# Patient Record
Sex: Female | Born: 1945 | Race: White | Hispanic: No | Marital: Married | Smoking: Never smoker
Health system: Southern US, Community
[De-identification: ages and names within clinical notes are randomized; demographics above are authoritative.]

## PROBLEM LIST (undated history)

## (undated) DIAGNOSIS — R42 Dizziness and giddiness: Secondary | ICD-10-CM

## (undated) DIAGNOSIS — D649 Anemia, unspecified: Secondary | ICD-10-CM

## (undated) DIAGNOSIS — K2970 Gastritis, unspecified, without bleeding: Secondary | ICD-10-CM

## (undated) DIAGNOSIS — E785 Hyperlipidemia, unspecified: Secondary | ICD-10-CM

## (undated) DIAGNOSIS — K5790 Diverticulosis of intestine, part unspecified, without perforation or abscess without bleeding: Secondary | ICD-10-CM

## (undated) DIAGNOSIS — R682 Dry mouth, unspecified: Secondary | ICD-10-CM

## (undated) DIAGNOSIS — R238 Other skin changes: Secondary | ICD-10-CM

## (undated) DIAGNOSIS — I213 ST elevation (STEMI) myocardial infarction of unspecified site: Secondary | ICD-10-CM

## (undated) DIAGNOSIS — K219 Gastro-esophageal reflux disease without esophagitis: Secondary | ICD-10-CM

## (undated) DIAGNOSIS — C069 Malignant neoplasm of mouth, unspecified: Secondary | ICD-10-CM

## (undated) DIAGNOSIS — S22080A Wedge compression fracture of T11-T12 vertebra, initial encounter for closed fracture: Secondary | ICD-10-CM

## (undated) DIAGNOSIS — I959 Hypotension, unspecified: Secondary | ICD-10-CM

## (undated) DIAGNOSIS — J45909 Unspecified asthma, uncomplicated: Secondary | ICD-10-CM

## (undated) DIAGNOSIS — J439 Emphysema, unspecified: Secondary | ICD-10-CM

## (undated) DIAGNOSIS — T4145XA Adverse effect of unspecified anesthetic, initial encounter: Secondary | ICD-10-CM

## (undated) DIAGNOSIS — M810 Age-related osteoporosis without current pathological fracture: Secondary | ICD-10-CM

## (undated) DIAGNOSIS — Z8719 Personal history of other diseases of the digestive system: Secondary | ICD-10-CM

## (undated) DIAGNOSIS — Z860100 Personal history of colon polyps, unspecified: Secondary | ICD-10-CM

## (undated) DIAGNOSIS — K5900 Constipation, unspecified: Secondary | ICD-10-CM

## (undated) DIAGNOSIS — R233 Spontaneous ecchymoses: Secondary | ICD-10-CM

## (undated) DIAGNOSIS — I255 Ischemic cardiomyopathy: Secondary | ICD-10-CM

## (undated) DIAGNOSIS — I2510 Atherosclerotic heart disease of native coronary artery without angina pectoris: Secondary | ICD-10-CM

## (undated) DIAGNOSIS — T8859XA Other complications of anesthesia, initial encounter: Secondary | ICD-10-CM

## (undated) DIAGNOSIS — S065XAA Traumatic subdural hemorrhage with loss of consciousness status unknown, initial encounter: Secondary | ICD-10-CM

## (undated) DIAGNOSIS — F0390 Unspecified dementia without behavioral disturbance: Secondary | ICD-10-CM

## (undated) DIAGNOSIS — S065X9A Traumatic subdural hemorrhage with loss of consciousness of unspecified duration, initial encounter: Secondary | ICD-10-CM

## (undated) DIAGNOSIS — R112 Nausea with vomiting, unspecified: Secondary | ICD-10-CM

## (undated) DIAGNOSIS — Z9889 Other specified postprocedural states: Secondary | ICD-10-CM

## (undated) HISTORY — DX: Traumatic subdural hemorrhage with loss of consciousness of unspecified duration, initial encounter: S06.5X9A

## (undated) HISTORY — DX: Hypotension, unspecified: I95.9

## (undated) HISTORY — PX: PEG PLACEMENT: SHX5437

## (undated) HISTORY — DX: Age-related osteoporosis without current pathological fracture: M81.0

## (undated) HISTORY — PX: TONSILLECTOMY: SUR1361

## (undated) HISTORY — PX: COLONOSCOPY: SHX174

## (undated) HISTORY — DX: Wedge compression fracture of T11-T12 vertebra, initial encounter for closed fracture: S22.080A

## (undated) HISTORY — DX: Emphysema, unspecified: J43.9

## (undated) HISTORY — DX: Traumatic subdural hemorrhage with loss of consciousness status unknown, initial encounter: S06.5XAA

## (undated) HISTORY — PX: PACEMAKER INSERTION: SHX728

## (undated) HISTORY — DX: Gastritis, unspecified, without bleeding: K29.70

## (undated) HISTORY — DX: Unspecified dementia, unspecified severity, without behavioral disturbance, psychotic disturbance, mood disturbance, and anxiety: F03.90

## (undated) HISTORY — DX: Hyperlipidemia, unspecified: E78.5

## (undated) HISTORY — DX: Malignant neoplasm of mouth, unspecified: C06.9

## (undated) HISTORY — DX: Atherosclerotic heart disease of native coronary artery without angina pectoris: I25.10

## (undated) HISTORY — PX: VAGINAL HYSTERECTOMY: SUR661

## (undated) HISTORY — DX: Ischemic cardiomyopathy: I25.5

---

## 1968-09-19 HISTORY — PX: BUNIONECTOMY: SHX129

## 1973-01-19 HISTORY — PX: TUBAL LIGATION: SHX77

## 1988-09-19 HISTORY — PX: SHOULDER OPEN ROTATOR CUFF REPAIR: SHX2407

## 1999-03-31 ENCOUNTER — Encounter: Payer: Self-pay | Admitting: Obstetrics and Gynecology

## 1999-03-31 ENCOUNTER — Encounter: Admission: RE | Admit: 1999-03-31 | Discharge: 1999-03-31 | Payer: Self-pay | Admitting: Obstetrics and Gynecology

## 1999-04-23 ENCOUNTER — Encounter: Payer: Self-pay | Admitting: *Deleted

## 1999-04-23 ENCOUNTER — Encounter (INDEPENDENT_AMBULATORY_CARE_PROVIDER_SITE_OTHER): Payer: Self-pay | Admitting: *Deleted

## 1999-04-23 ENCOUNTER — Ambulatory Visit (HOSPITAL_BASED_OUTPATIENT_CLINIC_OR_DEPARTMENT_OTHER): Admission: RE | Admit: 1999-04-23 | Discharge: 1999-04-23 | Payer: Self-pay | Admitting: *Deleted

## 2000-09-23 ENCOUNTER — Emergency Department (HOSPITAL_COMMUNITY): Admission: EM | Admit: 2000-09-23 | Discharge: 2000-09-23 | Payer: Self-pay | Admitting: Emergency Medicine

## 2001-01-19 DIAGNOSIS — C069 Malignant neoplasm of mouth, unspecified: Secondary | ICD-10-CM

## 2001-01-19 HISTORY — DX: Malignant neoplasm of mouth, unspecified: C06.9

## 2002-04-16 ENCOUNTER — Emergency Department (HOSPITAL_COMMUNITY): Admission: EM | Admit: 2002-04-16 | Discharge: 2002-04-16 | Payer: Self-pay | Admitting: Physical Therapy

## 2006-06-12 ENCOUNTER — Emergency Department (HOSPITAL_COMMUNITY): Admission: EM | Admit: 2006-06-12 | Discharge: 2006-06-12 | Payer: Self-pay | Admitting: Emergency Medicine

## 2008-11-15 ENCOUNTER — Emergency Department (HOSPITAL_COMMUNITY): Admission: EM | Admit: 2008-11-15 | Discharge: 2008-11-15 | Payer: Self-pay | Admitting: Family Medicine

## 2010-06-06 NOTE — Op Note (Signed)
[place]  Patient:    Pamela Kidd, Pamela Kidd                        MRN: [id number] Proc. Date: 04/23/99 Adm. Date:  [id number] Attending:  Kandis Mannan CC:         Katherine Roan, M.D.             Thomas B. Samuella Cota, M.D.                           Operative Report  CCS# [id number]  PREOPERATIVE DIAGNOSIS:  Abnormal mammogram, left breast.  POSTOPERATIVE DIAGNOSIS:  Abnormal mammogram, left breast.  OPERATION PERFORMED:  Needle localization and biopsy of left breast.  SURGEON:  Maisie Fus B. Samuella Cota, M.D.  ANESTHESIA:  1% Xylocaine local with anesthesia monitoring, anesthesiologist and CRNA.  DESCRIPTION OF PROCEDURE:  The patient was taken to the operating room and placed on the table in supine position.  The left breast was prepped and draped as sterile field.  The patient had a wire coming through the skin at about 3 oclock position.  A curved incision was outlined with a skin marker. 1% Xylocaine local was used to infiltrate the skin and underlying breast tissue.  The elliptical incision was made so as to remove the opening in the skin made by the wire.  Using the wire as a guide and the cautery, a core of breast tissue was taken out.  During the dissection there were numerous areas of cystic disease with some gray fairly thick fluid and some dilated ducts. Hemostasis was attained with the cautery and with suture ligatures of 3-0 Vicryl.  The specimen was given to the radiologist, who x-rayed the specimen and felt sure that the area of concern had been removed although it did not show up well.  Apparently when she had placed the needle earlier, she thought that she may have entered a cyst which made the area less prominent.  The needle was certainly localized well in the specimen.  It was removed.  The wound was irrigated.  Subcutaneous tissue closed with 3-0 Vicryl and the skin was closed with running subcuticular suture of 5-0 Vicryl.  Benzoin and  half inch Steri-Strips were used to reinforce the skin closure.  Pressure dressing using 4 x 4s, ABD and 4 inch Hypafix was applied.  The patient seemed to tolerate the procedure well and was taken to the PACU in satisfactory condition. DD:  04/23/99 TD:  04/24/99 Job: [id number]

## 2010-07-22 ENCOUNTER — Emergency Department (HOSPITAL_COMMUNITY)
Admission: EM | Admit: 2010-07-22 | Discharge: 2010-07-22 | Disposition: A | Discharge status: Home / Self Care | Payer: Medicare Other | Attending: Emergency Medicine | Admitting: Emergency Medicine

## 2010-07-22 DIAGNOSIS — E039 Hypothyroidism, unspecified: Secondary | ICD-10-CM | POA: Insufficient documentation

## 2010-07-22 DIAGNOSIS — R197 Diarrhea, unspecified: Secondary | ICD-10-CM | POA: Insufficient documentation

## 2010-07-22 DIAGNOSIS — R112 Nausea with vomiting, unspecified: Secondary | ICD-10-CM | POA: Insufficient documentation

## 2010-07-22 DIAGNOSIS — R824 Acetonuria: Secondary | ICD-10-CM | POA: Insufficient documentation

## 2010-07-22 DIAGNOSIS — R55 Syncope and collapse: Secondary | ICD-10-CM | POA: Insufficient documentation

## 2010-07-22 DIAGNOSIS — R109 Unspecified abdominal pain: Secondary | ICD-10-CM | POA: Insufficient documentation

## 2010-07-22 LAB — CBC
HCT: 42.8 % (ref 36.0–46.0)
Hemoglobin: 15 g/dL (ref 12.0–15.0)
MCH: 30.7 pg (ref 26.0–34.0)
MCHC: 35 g/dL (ref 30.0–36.0)
MCV: 87.5 fL (ref 78.0–100.0)
Platelets: 184 10*3/uL (ref 150–400)
RBC: 4.89 MIL/uL (ref 3.87–5.11)
RDW: 13.1 % (ref 11.5–15.5)
WBC: 12.5 10*3/uL — ABNORMAL HIGH (ref 4.0–10.5)

## 2010-07-22 LAB — COMPREHENSIVE METABOLIC PANEL
Albumin: 3.3 g/dL — ABNORMAL LOW (ref 3.5–5.2)
BUN: 12 mg/dL (ref 6–23)
Chloride: 110 mEq/L (ref 96–112)
Creatinine, Ser: 0.98 mg/dL (ref 0.50–1.10)
Total Bilirubin: 0.3 mg/dL (ref 0.3–1.2)
Total Protein: 6.2 g/dL (ref 6.0–8.3)

## 2010-07-22 LAB — DIFFERENTIAL
Basophils Absolute: 0 10*3/uL (ref 0.0–0.1)
Basophils Relative: 0 % (ref 0–1)
Eosinophils Absolute: 0 10*3/uL (ref 0.0–0.7)
Eosinophils Relative: 0 % (ref 0–5)
Lymphocytes Relative: 2 % — ABNORMAL LOW (ref 12–46)
Lymphs Abs: 0.3 10*3/uL — ABNORMAL LOW (ref 0.7–4.0)
Monocytes Absolute: 0.3 10*3/uL (ref 0.1–1.0)
Monocytes Relative: 2 % — ABNORMAL LOW (ref 3–12)
Neutro Abs: 11.9 10*3/uL — ABNORMAL HIGH (ref 1.7–7.7)
Neutrophils Relative %: 96 % — ABNORMAL HIGH (ref 43–77)

## 2010-07-22 LAB — LIPASE, BLOOD: Lipase: 24 U/L (ref 11–59)

## 2010-07-22 LAB — URINALYSIS, ROUTINE W REFLEX MICROSCOPIC
Ketones, ur: 15 mg/dL — AB
Leukocytes, UA: NEGATIVE
Nitrite: NEGATIVE
pH: 6 (ref 5.0–8.0)

## 2010-07-22 LAB — CK TOTAL AND CKMB (NOT AT ARMC)
CK, MB: 1.5 ng/mL (ref 0.3–4.0)
Relative Index: INVALID (ref 0.0–2.5)
Total CK: 55 U/L (ref 7–177)

## 2011-04-15 DIAGNOSIS — C01 Malignant neoplasm of base of tongue: Secondary | ICD-10-CM | POA: Insufficient documentation

## 2011-04-20 ENCOUNTER — Other Ambulatory Visit: Payer: Self-pay | Admitting: Family Medicine

## 2011-04-20 DIAGNOSIS — R1011 Right upper quadrant pain: Secondary | ICD-10-CM

## 2011-04-21 ENCOUNTER — Other Ambulatory Visit: Payer: Medicare Other

## 2011-04-24 ENCOUNTER — Ambulatory Visit
Admission: RE | Admit: 2011-04-24 | Discharge: 2011-04-24 | Disposition: A | Discharge status: Home / Self Care | Payer: Medicare Other | Source: Ambulatory Visit | Attending: Family Medicine | Admitting: Family Medicine

## 2011-04-24 DIAGNOSIS — R1011 Right upper quadrant pain: Secondary | ICD-10-CM

## 2011-08-20 DIAGNOSIS — I213 ST elevation (STEMI) myocardial infarction of unspecified site: Secondary | ICD-10-CM

## 2011-08-20 HISTORY — DX: ST elevation (STEMI) myocardial infarction of unspecified site: I21.3

## 2011-08-20 HISTORY — PX: CORONARY ANGIOPLASTY WITH STENT PLACEMENT: SHX49

## 2011-08-26 ENCOUNTER — Inpatient Hospital Stay (HOSPITAL_COMMUNITY): Payer: Medicare Other

## 2011-08-26 ENCOUNTER — Emergency Department (HOSPITAL_COMMUNITY)
Admission: EM | Admit: 2011-08-26 | Discharge: 2011-08-26 | Disposition: A | Discharge status: Home / Self Care | Payer: Medicare Other | Source: Ambulatory Visit | Attending: Cardiology | Admitting: Cardiology

## 2011-08-26 ENCOUNTER — Other Ambulatory Visit: Payer: Self-pay

## 2011-08-26 ENCOUNTER — Inpatient Hospital Stay (HOSPITAL_COMMUNITY)
Admission: EM | Admit: 2011-08-26 | Discharge: 2011-09-01 | DRG: 246 | Disposition: A | Discharge status: Home / Self Care | Payer: Medicare Other | Source: Ambulatory Visit | Attending: Cardiology | Admitting: Cardiology

## 2011-08-26 ENCOUNTER — Encounter (HOSPITAL_COMMUNITY)
Admission: EM | Disposition: A | Discharge status: Home / Self Care | Payer: Self-pay | Source: Ambulatory Visit | Attending: Cardiology

## 2011-08-26 ENCOUNTER — Encounter (HOSPITAL_COMMUNITY): Payer: Self-pay | Admitting: Internal Medicine

## 2011-08-26 DIAGNOSIS — I2510 Atherosclerotic heart disease of native coronary artery without angina pectoris: Secondary | ICD-10-CM | POA: Diagnosis present

## 2011-08-26 DIAGNOSIS — I252 Old myocardial infarction: Secondary | ICD-10-CM | POA: Diagnosis present

## 2011-08-26 DIAGNOSIS — I2109 ST elevation (STEMI) myocardial infarction involving other coronary artery of anterior wall: Secondary | ICD-10-CM

## 2011-08-26 DIAGNOSIS — I255 Ischemic cardiomyopathy: Secondary | ICD-10-CM | POA: Diagnosis present

## 2011-08-26 DIAGNOSIS — I959 Hypotension, unspecified: Secondary | ICD-10-CM | POA: Diagnosis present

## 2011-08-26 DIAGNOSIS — I472 Ventricular tachycardia, unspecified: Secondary | ICD-10-CM | POA: Diagnosis not present

## 2011-08-26 DIAGNOSIS — Z955 Presence of coronary angioplasty implant and graft: Secondary | ICD-10-CM

## 2011-08-26 DIAGNOSIS — E785 Hyperlipidemia, unspecified: Secondary | ICD-10-CM | POA: Diagnosis present

## 2011-08-26 DIAGNOSIS — K5730 Diverticulosis of large intestine without perforation or abscess without bleeding: Secondary | ICD-10-CM | POA: Diagnosis present

## 2011-08-26 DIAGNOSIS — I2589 Other forms of chronic ischemic heart disease: Secondary | ICD-10-CM | POA: Diagnosis present

## 2011-08-26 DIAGNOSIS — R7309 Other abnormal glucose: Secondary | ICD-10-CM | POA: Diagnosis present

## 2011-08-26 DIAGNOSIS — I4901 Ventricular fibrillation: Secondary | ICD-10-CM

## 2011-08-26 DIAGNOSIS — E7800 Pure hypercholesterolemia, unspecified: Secondary | ICD-10-CM | POA: Diagnosis present

## 2011-08-26 DIAGNOSIS — K1370 Unspecified lesions of oral mucosa: Secondary | ICD-10-CM | POA: Diagnosis not present

## 2011-08-26 DIAGNOSIS — I4729 Other ventricular tachycardia: Secondary | ICD-10-CM | POA: Diagnosis not present

## 2011-08-26 HISTORY — DX: Gastro-esophageal reflux disease without esophagitis: K21.9

## 2011-08-26 HISTORY — PX: PERCUTANEOUS CORONARY STENT INTERVENTION (PCI-S): SHX5485

## 2011-08-26 HISTORY — DX: Diverticulosis of intestine, part unspecified, without perforation or abscess without bleeding: K57.90

## 2011-08-26 HISTORY — PX: LEFT HEART CATHETERIZATION WITH CORONARY ANGIOGRAM: SHX5451

## 2011-08-26 LAB — CARDIAC PANEL(CRET KIN+CKTOT+MB+TROPI)
CK, MB: 69.8 ng/mL (ref 0.3–4.0)
Relative Index: 8.5 — ABNORMAL HIGH (ref 0.0–2.5)
Relative Index: 8.4 — ABNORMAL HIGH (ref 0.0–2.5)
Total CK: 395 U/L — ABNORMAL HIGH (ref 7–177)
Total CK: 834 U/L — ABNORMAL HIGH (ref 7–177)
Troponin I: >20 ng/mL (ref <0.30)

## 2011-08-26 LAB — POCT I-STAT, CHEM 8
BUN: 9 mg/dL (ref 6–23)
Calcium, Ion: 1.24 mmol/L (ref 1.13–1.30)
Creatinine, Ser: 0.9 mg/dL (ref 0.50–1.10)
TCO2: 21 mmol/L (ref 0–100)

## 2011-08-26 LAB — COMPREHENSIVE METABOLIC PANEL
Albumin: 3.5 g/dL (ref 3.5–5.2)
Alkaline Phosphatase: 84 U/L (ref 39–117)
BUN: 10 mg/dL (ref 6–23)
Potassium: 3.6 mEq/L (ref 3.5–5.1)
Sodium: 138 mEq/L (ref 135–145)
Total Protein: 6.4 g/dL (ref 6.0–8.3)

## 2011-08-26 LAB — CBC WITH DIFFERENTIAL/PLATELET
Basophils Relative: 0 % (ref 0–1)
Eosinophils Absolute: 0 10*3/uL (ref 0.0–0.7)
MCH: 29.8 pg (ref 26.0–34.0)
MCHC: 34 g/dL (ref 30.0–36.0)
Monocytes Relative: 3 % (ref 3–12)
Neutrophils Relative %: 92 % — ABNORMAL HIGH (ref 43–77)
Platelets: 259 10*3/uL (ref 150–400)
RDW: 13.1 % (ref 11.5–15.5)

## 2011-08-26 LAB — MRSA PCR SCREENING: MRSA by PCR: NEGATIVE

## 2011-08-26 LAB — MAGNESIUM: Magnesium: 1.9 mg/dL (ref 1.5–2.5)

## 2011-08-26 LAB — LIPID PANEL
Cholesterol: 237 mg/dL — ABNORMAL HIGH (ref 0–200)
HDL: 57 mg/dL (ref >39)
Total CHOL/HDL Ratio: 4.2 RATIO
Triglycerides: 86 mg/dL (ref <150)

## 2011-08-26 LAB — PROTIME-INR: Prothrombin Time: 22.9 seconds — ABNORMAL HIGH (ref 11.6–15.2)

## 2011-08-26 LAB — HEMOGLOBIN A1C: Mean Plasma Glucose: 120 mg/dL — ABNORMAL HIGH (ref <117)

## 2011-08-26 LAB — POCT ACTIVATED CLOTTING TIME: Activated Clotting Time: 364 seconds

## 2011-08-26 SURGERY — LEFT HEART CATHETERIZATION WITH CORONARY ANGIOGRAM
Anesthesia: LOCAL

## 2011-08-26 MED ORDER — NITROGLYCERIN 0.2 MG/ML ON CALL CATH LAB
INTRAVENOUS | Status: AC
  Filled 2011-08-26: qty 1

## 2011-08-26 MED ORDER — TICAGRELOR 90 MG PO TABS
90.0000 mg | ORAL_TABLET | Freq: Two times a day (BID) | ORAL | Status: DC
  Administered 2011-08-26 – 2011-09-01 (×12): 90 mg via ORAL
  Filled 2011-08-26 (×15): qty 1

## 2011-08-26 MED ORDER — ONDANSETRON HCL 4 MG/2ML IJ SOLN
4.0000 mg | Freq: Four times a day (QID) | INTRAMUSCULAR | Status: DC | PRN
  Administered 2011-08-27: 4 mg via INTRAVENOUS
  Filled 2011-08-26: qty 2

## 2011-08-26 MED ORDER — LIDOCAINE HCL (PF) 1 % IJ SOLN
INTRAMUSCULAR | Status: AC
  Filled 2011-08-26: qty 30

## 2011-08-26 MED ORDER — AMIODARONE HCL 150 MG/3ML IV SOLN
INTRAVENOUS | Status: AC
  Filled 2011-08-26: qty 3

## 2011-08-26 MED ORDER — ATORVASTATIN CALCIUM 20 MG PO TABS
80.0000 mg | ORAL_TABLET | Freq: Every day | ORAL | Status: DC
  Administered 2011-08-26 – 2011-08-31 (×6): 80 mg via ORAL
  Filled 2011-08-26 (×4): qty 1
  Filled 2011-08-26: qty 4
  Filled 2011-08-26 (×2): qty 1

## 2011-08-26 MED ORDER — NITROGLYCERIN 0.4 MG SL SUBL: 0.4000 mg | SUBLINGUAL_TABLET | SUBLINGUAL | Status: DC | PRN

## 2011-08-26 MED ORDER — ACETAMINOPHEN 325 MG PO TABS: 650.0000 mg | ORAL_TABLET | ORAL | Status: DC | PRN

## 2011-08-26 MED ORDER — PANTOPRAZOLE SODIUM 40 MG PO TBEC
40.0000 mg | DELAYED_RELEASE_TABLET | Freq: Every day | ORAL | Status: DC
  Administered 2011-08-26 – 2011-09-01 (×7): 40 mg via ORAL
  Filled 2011-08-26 (×7): qty 1

## 2011-08-26 MED ORDER — SODIUM CHLORIDE 0.9 % IV BOLUS (SEPSIS)
500.0000 mL | Freq: Once | INTRAVENOUS | Status: AC
  Administered 2011-08-26: 500 mL via INTRAVENOUS

## 2011-08-26 MED ORDER — HEPARIN SODIUM (PORCINE) 5000 UNIT/ML IJ SOLN
5000.0000 [IU] | Freq: Three times a day (TID) | INTRAMUSCULAR | Status: DC
  Administered 2011-08-26 – 2011-08-28 (×5): 5000 [IU] via SUBCUTANEOUS
  Filled 2011-08-26 (×9): qty 1

## 2011-08-26 MED ORDER — ONDANSETRON HCL 4 MG/2ML IJ SOLN
INTRAMUSCULAR | Status: AC
  Filled 2011-08-26: qty 2

## 2011-08-26 MED ORDER — CARVEDILOL 3.125 MG PO TABS
3.1250 mg | ORAL_TABLET | Freq: Two times a day (BID) | ORAL | Status: DC
  Administered 2011-08-26 (×2): 3.125 mg via ORAL
  Filled 2011-08-26 (×5): qty 1

## 2011-08-26 MED ORDER — SODIUM CHLORIDE 0.9 % IV SOLN
INTRAVENOUS | Status: AC
  Administered 2011-08-26: 06:00:00 via INTRAVENOUS

## 2011-08-26 MED ORDER — LISINOPRIL 2.5 MG PO TABS
2.5000 mg | ORAL_TABLET | Freq: Every day | ORAL | Status: DC
  Administered 2011-08-26: 2.5 mg via ORAL
  Filled 2011-08-26 (×2): qty 1

## 2011-08-26 MED ORDER — TICAGRELOR 90 MG PO TABS
ORAL_TABLET | ORAL | Status: AC
  Filled 2011-08-26: qty 2

## 2011-08-26 MED ORDER — BIVALIRUDIN 250 MG IV SOLR
INTRAVENOUS | Status: AC
  Filled 2011-08-26: qty 250

## 2011-08-26 MED ORDER — ACETAMINOPHEN 325 MG PO TABS
650.0000 mg | ORAL_TABLET | ORAL | Status: DC | PRN
  Administered 2011-08-26 (×2): 650 mg via ORAL
  Filled 2011-08-26 (×2): qty 2

## 2011-08-26 MED ORDER — SODIUM CHLORIDE 0.9 % IV SOLN: 0.2500 mg/kg/h | INTRAVENOUS | Status: AC

## 2011-08-26 MED ORDER — HEPARIN (PORCINE) IN NACL 2-0.9 UNIT/ML-% IJ SOLN
INTRAMUSCULAR | Status: AC
  Filled 2011-08-26: qty 2000

## 2011-08-26 MED ORDER — HEPARIN SODIUM (PORCINE) 5000 UNIT/ML IJ SOLN: 5000.0000 [IU] | Freq: Three times a day (TID) | INTRAMUSCULAR | Status: DC

## 2011-08-26 MED ORDER — ONDANSETRON HCL 4 MG/2ML IJ SOLN: 4.0000 mg | Freq: Four times a day (QID) | INTRAMUSCULAR | Status: DC | PRN

## 2011-08-26 MED ORDER — ASPIRIN EC 81 MG PO TBEC
81.0000 mg | DELAYED_RELEASE_TABLET | Freq: Every day | ORAL | Status: DC
  Administered 2011-08-26 – 2011-09-01 (×7): 81 mg via ORAL
  Filled 2011-08-26 (×7): qty 1

## 2011-08-26 NOTE — CV Procedure (Signed)
  Cardiac Catheterization Procedure Note  Name: Pamela Kidd MRN: [id number] DOB: 1945/06/19  Procedure: Left Heart Cath, Selective Coronary Angiography, LV angiography,  PTCA/Stent of the proximal LAD  Indication: 66 year old white female with no prior history of coronary disease and no cardiac risk factors who presents with an anterior ST elevation myocardial infarction. She has 2 mm of ST segment elevation in leads V2 through V3. He has ongoing chest pain.   Diagnostic Procedure Details: The right groin was prepped, draped, and anesthetized with 1% lidocaine. Using the modified Seldinger technique, a 6 French sheath was introduced into the right femoral artery. Standard Judkins catheters were used for selective coronary angiography and left ventriculography. Catheter exchanges were performed over a wire.  The diagnostic procedure was well-tolerated without immediate complications.  PROCEDURAL FINDINGS Hemodynamics: AO 118/74 with a mean of 94 mmHg LV 112/18 mmHg  Coronary angiography: Coronary dominance: right  Left mainstem: Short and without significant disease.  Left anterior descending (LAD): 100% occlusion in the proximal vessel.  Left circumflex (LCx): This is a large vessel with 3 marginal branches. There are mild irregularities less than 20%.  Right coronary artery (RCA): This is a dominant but relatively small vessel. There is a 20-30% narrowing in the mid vessel.  Left ventriculography: Left ventricular systolic function post intervention is abnormal, LVEF is estimated at 30-35 %, there is extensive anterior and apical akinesis. there is no significant mitral regurgitation   PCI Procedure Note:  Following the diagnostic procedure, the decision was made to proceed with PCI. Brilinta 180 mg po was given. Weight-based bivalirudin was given for anticoagulation. Once a therapeutic ACT was achieved, a 6 [place] XB LAD 3.5 guide catheter was inserted.  A pro-water coronary guidewire  was used to cross the lesion.  The lesion was predilated with a 2.5 mm balloon. With initial reperfusion the patient developed sustained polymorphic ventricular tachycardia/fibrillation and required a single DC shock.  The lesion was then stented with a 2.75 x 23 mm Xience Xpedition stent.  The stent was postdilated with a 2.75 mm noncompliant balloon to 16 atmospheres.  Following PCI, there was 0% residual stenosis and TIMI-3 flow. Final angiography confirmed an excellent result. Femoral hemostasis was achieved with manual compression.  She had no further arrhythmias and was pain-free at the end of procedure. The patient tolerated the PCI procedure well. There were no immediate procedural complications.  The patient was transferred to the post catheterization recovery area for further monitoring.  PCI Data: Vessel - LAD/Segment - proximal Percent Stenosis (pre)  100% TIMI-flow 0 Stent 2.75 x 23 mm Xience Xpedition Percent Stenosis (post) 0% TIMI-flow (post) 3  Final Conclusions:   1. Single vessel occlusive coronary disease. 2. Severe left ventricular dysfunction. 3. Successful stenting of the proximal LAD with a drug-eluting stent.  Recommendations: Continue dual antiplatelet therapy for one year. Risk factor modification.  [facility] 08/26/2011, 5:02 AM

## 2011-08-26 NOTE — Progress Notes (Signed)
   TELEMETRY: Reviewed telemetry pt in NSR. : Filed Vitals:   08/26/11 0615 08/26/11 0630 08/26/11 0645 08/26/11 0700  BP:    111/79  Pulse: 85 85 85 90  Temp:      TempSrc:      Resp: 15 15 13 16   Height:      Weight:      SpO2: 98% 99% 98% 98%    Intake/Output Summary (Last 24 hours) at 08/26/11 0741 Last data filed at 08/26/11 0700  Gross per 24 hour  Intake    150 ml  Output      0 ml  Net    150 ml    SUBJECTIVE Denies any chest pain or SOB. Feels much better.  LABS: Basic Metabolic Panel:  Basename 08/26/11 0605  NA 138  K 3.6  CL 104  CO2 22  GLUCOSE 137*  BUN 10  CREATININE 0.88  CALCIUM 9.7  MG 1.9  PHOS --   Liver Function Tests:  [facility] 08/26/11 0605  AST 32  ALT 11  ALKPHOS 84  BILITOT 0.4  PROT 6.4  ALBUMIN 3.5   CBC:  Basename 08/26/11 0605  WBC 15.3*  NEUTROABS 14.0*  HGB 13.9  HCT 40.9  MCV 87.6  PLT 259   Cardiac Enzymes:  Basename 08/26/11 0605  CKTOTAL 395*  CKMB 33.7*  CKMBINDEX --  TROPONINI 13.07*   Hemoglobin A1C: No results found for this basename: HGBA1C in the last 72 hours Fasting Lipid Panel:  Basename 08/26/11 0605  CHOL 237*  HDL 57  LDLCALC 163*  TRIG 86  CHOLHDL 4.2  LDLDIRECT --   Radiology/Studies:  No results found.  Ecg: NSR with Q waves in V1-2. ST elevation resolved.  PHYSICAL EXAM General: Well developed, well nourished, in no acute distress. Head: Normocephalic, atraumatic, sclera non-icteric, no xanthomas, nares are without discharge. Neck: Negative for carotid bruits. JVD not elevated. Lungs: Clear bilaterally to auscultation without wheezes, rales, or rhonchi. Breathing is unlabored. Heart: RRR S1 S2 without murmurs, rubs, or gallops.  Abdomen: Soft, non-tender, non-distended with normoactive bowel sounds. No hepatomegaly. No rebound/guarding. No obvious abdominal masses. Msk:  Strength and tone appears normal for age. Extremities: No clubbing, cyanosis or edema.  Distal pedal  pulses are 2+ and equal bilaterally. Right groin without hematoma/ Neuro: Alert and oriented X 3. Moves all extremities spontaneously. Psych:  Responds to questions appropriately with a normal affect.  ASSESSMENT AND PLAN: 1. STEMI anterior. S/p DES proximal LAD. Continue ASA, Brilinta. Start low dose carvedilol and lisinopril. Observe for complications. No further VT since reperfusion. Echo today. Check CXR.  2. Hypercholesterolemia. On Lipitor.   3. Hypokalemia improved.   4. Elevated blood glucose. Will check A1c  5. Ischemic cardiomyopathy EF 30-35% by cath. Await Echo. Start ACEi and carvedilol. Titrate dose as tolerated.  Principal Problem:  *ST elevation myocardial infarction (STEMI) of anterior wall Active Problems:  Ventricular tachycardia  Ischemic cardiomyopathy  Hypercholesterolemia    Signed, Idalis Hoelting [place] MD,FACC 08/26/2011 7:41 AM

## 2011-08-26 NOTE — Care Management Note (Addendum)
    Page 1 of 1   08/31/2011     2:48:19 PM   CARE MANAGEMENT NOTE 08/31/2011  Patient:  GARYN, WAGUESPACK   Account Number:  [id number]  Date Initiated:  08/26/2011  Documentation initiated by:  Junius Creamer  Subjective/Objective Assessment:   adm w mi     Action/Plan:   lives w husband, pcp dr Broadus John pickard   Anticipated DC Date:  08/31/2011   Anticipated DC Plan:  HOME/SELF CARE      DC Planning Services  CM consult      Choice offered to / List presented to:             Status of service:  Completed, signed off Medicare Important Message given?   (If response is "NO", the following Medicare IM given date fields will be blank) Date Medicare IM given:   Date Additional Medicare IM given:    Discharge Disposition:  HOME/SELF CARE  Per UR Regulation:  Reviewed for med. necessity/level of care/duration of stay  If discussed at Long Length of Stay Meetings, dates discussed:    Comments:  08/31/11- 1230- Donn Pierini RN, BSN [phone] Pt to d/c with Brilinta- spoke with pt at bedside to confirm that she had the Brilinta savings card which she does- also confirmed drugstore of choice to be [facility] on [address]- called [facility]- and they do not have Brilinta in stock-would have to order- spoke with pt again to see if she had another pharmacy she wanted to use in case she is discharged today- pt states [facility] on [address]- call made to [facility]- which does have the Brilinta in stock- pt informed of this- and states she appreciates assistance- plans to go to [facility].  No other d/c needs noted at this time.   8/7 10:23a debbie dowell rn,bsn [phone] pt has 45.00 copay for 30days of brilinta and 125.00 for 90day mail in.

## 2011-08-26 NOTE — Progress Notes (Signed)
Right femoral sheath removed and pressure held for 20 minutes. No evidence of bruising  or hematoma. Patient given instructions and verbalizes understanding. Right pedal pulse palpable. Takia RN called in to confirm condition of right groin and right pedal pulse.

## 2011-08-26 NOTE — Progress Notes (Signed)
CRITICAL VALUE ALERT  Critical value received:  CKMB 33.7 and Trop 13.07  Date of notification:  08/26/2011  Time of notification:  0731  Critical value read back:yes  Nurse who received alert:  Malachy Chamber, RN   MD notified (1st page):  Dr. [place]   Time of first page:  [phone]  MD notified (2nd page):  Time of second page:  Responding MD:  Dr. [place]  Time MD responded:  [phone]

## 2011-08-26 NOTE — H&P (Signed)
Physician History and Physical    Pamela Kidd MRN: [id number] DOB/AGE: 07-30-1945 66 y.o. Admit date: 08/26/2011  Primary Cardiologist:  New pt  CC:  CP, Anterior STEMI  HPI:  Pt is a 66 yo woman with hx of diverticulosis who presents with chest pain starting at 10 pm tonight.  EKG per EMS shows anterior STEMI.  Pt reports that she is very functional, no prior episodes of chest pain.  Reports that she actually drove back from the beach today before this happened.  She denies SOB, PND, orthopnea, LE edema.  SHe currently has substernal CP.  Per EMS, they were not able to given nitro because pt was hypotensive on presentation, SBP 80s.  SHe did get 324 mg of aspirin. She has no other acute complaints.  Review of systems: A review of 10 organ systems was done and is negative except as stated above in HPI  Past Medical History  Diagnosis Date  . Diverticulosis    PSH: pt reports that she has had some abd surgeries, but is unable to recall what those surgeries were  History   Social History  . Marital Status: Married    Spouse Name: N/A    Number of Children: N/A  . Years of Education: N/A   Occupational History  . Not on file.   Social History Main Topics  . Smoking status: Never Smoker   . Smokeless tobacco: Not on file  . Alcohol Use: Not on file  . Drug Use: Not on file  . Sexually Active: Not on file   Other Topics Concern  . Not on file   Social History Narrative  . No narrative on file    FH: father died age 90 of MI   Allergies  Allergen Reactions  . Codeine     No prescriptions prior to admission  pt is not currently taking any m eds    Physical Exam: There were no vitals taken for this visit.; There is no height or weight on file to calculate BMI.    No intake or output data in the 24 hours ending 08/26/11 0433 General: NAD Heent: MMM Neck: No JVD  CV: RRR, no m Lungs: Clear to auscultation bilaterally with normal respiratory  effort Abdomen: Soft, nontender, nondistended Extremities: No clubbing or cyanosis. No pedal edema Skin: Intact without lesions or rashes  Neurologic: Alert and oriented x 3, grossly nonfocal  Psych: Normal mood and affect    Labs: No results found for this basename: CKTOTAL:4,CKMB:4,TROPONINI:4 in the last 72 hours Lab Results  Component Value Date   WBC 12.5* 07/22/2010   HGB 15.0 07/22/2010   HCT 42.8 07/22/2010   MCV 87.5 07/22/2010   PLT 184 07/22/2010   No results found for this basename: NA,K,CL,CO2,BUN,CREATININE,CALCIUM,LABALBU,PROT,BILITOT,ALKPHOS,ALT,AST,GLUCOSE in the last 168 hours No results found for this basename: CHOL, HDL, LDLCALC, TRIG      EKG:  Sinus STE V1-3 with ST dep inf (reciprocal changes)  Cath:   100% occlusion prox LAD s/p DES  In cath lab, pt with VT s/p 200j shock and amio bolus   Radiology:  No results found.  ASSESSMENT:  Pt is a 66 yo woman with hx of diverticulosis who presents with chest pain starting at 10 pm tonight.  EKG per EMS shows anterior STEMI  PLAN:  Anterior STEMI s/p prox LAD DES  - aspirin, brilinta, statin  - check lipids  - trend enzymes - echo  - add BB  when BPs tolerate  VT s/p 200j shock and amio bolus in cath lab - can start amio gtt if recurs   Signed: Hilary Hertz, MD Cardiology Fellow 08/26/2011, 4:33 AM

## 2011-08-26 NOTE — Progress Notes (Signed)
Nutrition Brief Note  Patient identified on the Nutrition Risk Report for problems chewing and swallowing. Pt reports chewing problems r/t hx of oral cancer. Pt chooses soft foods that she knows she can tolerate. Pt to order meals.   Body mass index is 21.38 kg/(m^2). Pt meets criteria for WNL based on current BMI.   Current diet order is heart, no meals completed at this time. Labs and medications reviewed.   No nutrition interventions warranted at this time. If nutrition issues arise, please re-consult RD.   Clarene Duke RD, LDN Pager [phone] After Hours pager [phone]

## 2011-08-26 NOTE — Progress Notes (Signed)
Responded to code stemi. I inquired about family of pt. Pt's husband was enroute.  Alerted ED triage nurse that family would be arriving shortly and requested to be paged upon arrival. Dellie Catholic  [phone] oncall pager

## 2011-08-26 NOTE — Progress Notes (Signed)
This visit was in response to a code stemi.  ED paged me when pt's husband, Marcy Salvo, arrived.  I escorted husband to consultation room outside of cath lab and alerted medical staff of his presence.  I provided emotional support while pt's husband waited for cath procedure to be over and remained present during doctor's explanation of what had taken place.  After the consultation, I escorted pt's husband to 2900 waiting room and alerted staff that he was present.  Pt's husband was grateful for support.   Please page me if further assistance is needed or follow-up with the unit chaplain is necessary. Delray Reza  [phone] oncall pager

## 2011-08-27 DIAGNOSIS — I2589 Other forms of chronic ischemic heart disease: Secondary | ICD-10-CM

## 2011-08-27 DIAGNOSIS — E7800 Pure hypercholesterolemia, unspecified: Secondary | ICD-10-CM

## 2011-08-27 DIAGNOSIS — I369 Nonrheumatic tricuspid valve disorder, unspecified: Secondary | ICD-10-CM

## 2011-08-27 LAB — CBC
HCT: 40 % (ref 36.0–46.0)
Platelets: 242 10*3/uL (ref 150–400)
RDW: 13.4 % (ref 11.5–15.5)
WBC: 8.9 10*3/uL (ref 4.0–10.5)

## 2011-08-27 LAB — BASIC METABOLIC PANEL
BUN: 8 mg/dL (ref 6–23)
Chloride: 109 mEq/L (ref 96–112)
GFR calc Af Amer: 56 mL/min — ABNORMAL LOW (ref >90)
Potassium: 4.6 mEq/L (ref 3.5–5.1)
Sodium: 143 mEq/L (ref 135–145)

## 2011-08-27 MED FILL — Dextrose Inj 5%: INTRAVENOUS | Qty: 50 | Status: AC

## 2011-08-27 MED FILL — Perflutren Lipid Microsphere IV Susp 6.52 MG/ML: INTRAVENOUS | Qty: 2 | Status: AC

## 2011-08-27 NOTE — Progress Notes (Signed)
CARDIAC REHAB PHASE I   PRE:  Rate/Rhythm: 84 SR  BP:  Supine: 109/71  Sitting:   Standing:    SaO2:  MODE:  Ambulation: 350 ft   POST:  Rate/Rhythem: 105 ST  BP:  Supine:   Sitting: 117/73  Standing:    SaO2:  1400-1140 Tolerated ambulation well without c/o of cp or SOB. VS stable Pt back to bed after walk with call light in reach. Pt given MI booklet and stent information. Will follow pt tomorrow to continue education and ambulation.  Beatrix Fetters

## 2011-08-27 NOTE — Progress Notes (Signed)
   TELEMETRY: Reviewed telemetry pt in NSR. : Filed Vitals:   08/27/11 0300 08/27/11 0400 08/27/11 0500 08/27/11 0600  BP: 73/52 83/51 79/52  78/50  Pulse:      Temp:  98.5 F (36.9 C)    TempSrc:  Oral    Resp:  13    Height:      Weight:      SpO2:  97%      Intake/Output Summary (Last 24 hours) at 08/27/11 0643 Last data filed at 08/27/11 0000  Gross per 24 hour  Intake   1920 ml  Output   1650 ml  Net    270 ml    SUBJECTIVE Denies any chest pain or SOB. Feels a little lightheaded when she first gets up.  LABS: Basic Metabolic Panel:  Basename 08/26/11 0605 08/26/11 0425  NA 138 142  K 3.6 3.1*  CL 104 109  CO2 22 --  GLUCOSE 137* 159*  BUN 10 9  CREATININE 0.88 0.90  CALCIUM 9.7 --  MG 1.9 --  PHOS -- --   Liver Function Tests:  [facility] 08/26/11 0605  AST 32  ALT 11  ALKPHOS 84  BILITOT 0.4  PROT 6.4  ALBUMIN 3.5   CBC:  Basename 08/26/11 0605 08/26/11 0425  WBC 15.3* --  NEUTROABS 14.0* --  HGB 13.9 13.6  HCT 40.9 40.0  MCV 87.6 --  PLT 259 --   Cardiac Enzymes:  Basename 08/26/11 1735 08/26/11 1125 08/26/11 0605  CKTOTAL 834* 878* 395*  CKMB 69.8* 82.2* 33.7*  CKMBINDEX -- -- --  TROPONINI >20.00* >20.00* 13.07*   Hemoglobin A1C:  Basename 08/26/11 0605  HGBA1C 5.8*   Fasting Lipid Panel:  Basename 08/26/11 0605  CHOL 237*  HDL 57  LDLCALC 163*  TRIG 86  CHOLHDL 4.2  LDLDIRECT --   Radiology/Studies:  No results found.  Ecg: NSR with Q waves in V1-2. ST elevation resolved.  PHYSICAL EXAM General: Well developed, well nourished, in no acute distress. Head: Normocephalic, atraumatic, sclera non-icteric, no xanthomas, nares are without discharge. Neck: Negative for carotid bruits. JVD not elevated. Lungs: Clear bilaterally to auscultation without wheezes, rales, or rhonchi. Breathing is unlabored. Heart: RRR S1 S2 without murmurs, rubs, or gallops.  Abdomen: Soft, non-tender, non-distended with normoactive bowel  sounds. No hepatomegaly. No rebound/guarding. No obvious abdominal masses. Msk:  Strength and tone appears normal for age. Extremities: No clubbing, cyanosis or edema.  Distal pedal pulses are 2+ and equal bilaterally. Right groin without hematoma. Neuro: Alert and oriented X 3. Moves all extremities spontaneously. Psych:  Responds to questions appropriately with a normal affect.  ASSESSMENT AND PLAN: 1. STEMI anterior. S/p DES proximal LAD. Continue ASA, Brilinta. Patient significantly hypotensive- will hold ACEi and beta blocker.  No further VT since reperfusion. Echo today.   2. Hypercholesterolemia. On Lipitor.   3. Hypokalemia improved.   4. Elevated blood glucose. A1c 5.8%.  5. Ischemic cardiomyopathy EF 30-35% by cath. Await Echo. Hold ACEi and carvedilol due to hypotension.   Principal Problem:  *ST elevation myocardial infarction (STEMI) of anterior wall Active Problems:  Ventricular tachycardia  Ischemic cardiomyopathy  Hypercholesterolemia    Signed, Peter [place] MD,FACC 08/27/2011 6:43 AM

## 2011-08-27 NOTE — Progress Notes (Signed)
Echocardiogram 2D Echocardiogram with Definity has been performed.  Cliff Damiani 08/27/2011, 1:12 PM

## 2011-08-28 LAB — BASIC METABOLIC PANEL
CO2: 23 mEq/L (ref 19–32)
Chloride: 107 mEq/L (ref 96–112)
Potassium: 3.8 mEq/L (ref 3.5–5.1)
Sodium: 142 mEq/L (ref 135–145)

## 2011-08-28 LAB — CBC
HCT: 41.2 % (ref 36.0–46.0)
Hemoglobin: 13.6 g/dL (ref 12.0–15.0)
MCV: 89.2 fL (ref 78.0–100.0)
Platelets: 257 10*3/uL (ref 150–400)

## 2011-08-28 NOTE — Progress Notes (Signed)
CARDIAC REHAB PHASE I   PRE:  Rate/Rhythm: 89 SR  BP:  Supine:   Sitting: 80/60  Standing:    SaO2: 99 RA  MODE:  Ambulation: 550 ft   POST:  Rate/Rhythem: 91 SR  BP:  Supine:   Sitting: 90/54  Standing:    SaO2: 98 RA 1345-1445 Tolerated ambulation well without c/o of cp or SOB Pt not symptomatic with BP. Gait steady Walker 550 feet Completed MI education with pt. She agrees to [employer]. CRP in [place], will send referral. Pamela Kidd

## 2011-08-28 NOTE — Progress Notes (Signed)
TELEMETRY: Reviewed telemetry pt in NSR. : Filed Vitals:   08/27/11 2000 08/28/11 0000 08/28/11 0400 08/28/11 0500  BP: 113/76 110/74 89/57   Pulse: 96 88 87   Temp: 98.9 F (37.2 C) 99 F (37.2 C) 98.1 F (36.7 C)   TempSrc: Oral Oral Oral   Resp: 16 18 16    Height:      Weight:    47.1 kg (103 lb 13.4 oz)  SpO2: 97% 95% 97%     Intake/Output Summary (Last 24 hours) at 08/28/11 0700 Last data filed at 08/28/11 0400  Gross per 24 hour  Intake    520 ml  Output   1401 ml  Net   -881 ml    SUBJECTIVE Denies any chest pain or SOB. Ambulated well. Awoke with blood in mouth this am.   LABS: Basic Metabolic Panel:  Basename 08/27/11 0545 08/26/11 0605  NA 143 138  K 4.6 3.6  CL 109 104  CO2 26 22  GLUCOSE 99 137*  BUN 8 10  CREATININE 1.16* 0.88  CALCIUM 8.8 9.7  MG -- 1.9  PHOS -- --   Liver Function Tests:  [facility] 08/26/11 0605  AST 32  ALT 11  ALKPHOS 84  BILITOT 0.4  PROT 6.4  ALBUMIN 3.5   CBC:  Basename 08/27/11 0545 08/26/11 0605  WBC 8.9 15.3*  NEUTROABS -- 14.0*  HGB 13.3 13.9  HCT 40.0 40.9  MCV 89.3 87.6  PLT 242 259   Cardiac Enzymes:  Basename 08/26/11 1735 08/26/11 1125 08/26/11 0605  CKTOTAL 834* 878* 395*  CKMB 69.8* 82.2* 33.7*  CKMBINDEX -- -- --  TROPONINI >20.00* >20.00* 13.07*   Hemoglobin A1C:  Basename 08/26/11 0605  HGBA1C 5.8*   Fasting Lipid Panel:  Basename 08/26/11 0605  CHOL 237*  HDL 57  LDLCALC 163*  TRIG 86  CHOLHDL 4.2  LDLDIRECT --   Radiology/Studies:  Portable chest x-ray 1 view [[id number]]      Order Status: Completed   Resulted: 08/26/11 0733   Narrative:   *RADIOLOGY REPORT*  Clinical Data: Chest pain.  PORTABLE CHEST - 1 VIEW  Comparison: None.  Findings: Heart size and vascularity are normal and the lungs are clear. No osseous abnormality.  IMPRESSION: Normal chest.  Original Report Authenticated By: Gwynn Burly, M.D.     [facility]* *[facility]* [address] [place], [place] [address] [phone]  ------------------------------------------------------------ Transthoracic Echocardiography  Patient: Pamela Kidd, Pamela Kidd MR #: [id number] Study Date: 08/27/2011 Gender: F Age: 66 Height: 147.3cm Weight: 46.4kg BSA: 1.27m^2 Pt. Status: Room: 2913  ADMITTING [place], Peter ATTENDING [place], Peter PERFORMING [facility] SONOGRAPHER Nolon Rod, RDCS ORDERING Foley, Jillian REFERRING Foley, Jillian cc:  ------------------------------------------------------------ LV EF: 40% LV EF: 35% - 40%  ------------------------------------------------------------ Indications: MI - acute 410.91.  ------------------------------------------------------------ History: Risk factors: Hypercholesterolemia.  ------------------------------------------------------------ Study Conclusions  - Left ventricle: The cavity size was normal. There was mild focal basal hypertrophy of the septum. Systolic function was moderately reduced. The estimated ejection fraction was 40%, in the range of 35% to 40%. There is akinesis of the mid-distalanteroseptal and apical myocardium. Doppler parameters are consistent with abnormal left ventricular relaxation (grade 1 diastolic dysfunction). - Tricuspid valve: Mild-moderate regurgitation. - Pulmonary arteries: PA peak pressure: 33mm Hg (S). Transthoracic echocardiography. M-mode, complete 2D, spectral Doppler, and color Doppler. Height: Height: 147.3cm. Height: 58in. Weight: Weight: 46.4kg. Weight: 102lb. Body mass index: BMI: 21.4kg/m^2. Body surface area: BSA: 1.20m^2. Blood pressure: 96/56.  Patient status: Inpatient. Location: ICU/CCU  ------------------------------------------------------------  ------------------------------------------------------------ Left ventricle: The cavity size was normal. There was mild focal basal hypertrophy of the septum. Systolic function  was moderately reduced. The estimated ejection fraction was 40%, in the range of 35% to 40%. Regional wall motion abnormalities: There is akinesis of the mid-distalanteroseptal and apical myocardium. Doppler parameters are consistent with abnormal left ventricular relaxation (grade 1 diastolic dysfunction).  ------------------------------------------------------------ Aortic valve: Trileaflet; mildly thickened leaflets. Mobility was not restricted. Doppler: Transvalvular velocity was within the normal range. There was no stenosis. No regurgitation.  ------------------------------------------------------------ Aorta: Aortic root: The aortic root was normal in size.  ------------------------------------------------------------ Mitral valve: Structurally normal valve. Mobility was not restricted. Doppler: Transvalvular velocity was within the normal range. There was no evidence for stenosis. No regurgitation.  ------------------------------------------------------------ Left atrium: The atrium was normal in size.  ------------------------------------------------------------ Right ventricle: The cavity size was normal. Systolic function was normal.  ------------------------------------------------------------ Pulmonic valve: Doppler: Transvalvular velocity was within the normal range. There was no evidence for stenosis.  ------------------------------------------------------------ Tricuspid valve: Structurally normal valve. Doppler: Transvalvular velocity was within the normal range. Mild-moderate regurgitation.  ------------------------------------------------------------ Pulmonary artery: Systolic pressure was within the normal range.  ------------------------------------------------------------ Right atrium: The atrium was normal in size.  ------------------------------------------------------------ Pericardium: There was no pericardial  effusion.  ------------------------------------------------------------ Systemic veins: Inferior vena cava: The vessel was normal in size.  ------------------------------------------------------------  2D measurements Normal Doppler measurements Normal Left ventricle Main pulmonary LVID ED, 34.7 mm 43-52 artery chord, Pressure, S 33 mm =30 PLAX Hg LVID ES, 27.8 mm 23-38 Left ventricle chord, Ea, lat ann, 6.0 cm/s ------ PLAX tiss DP 3 FS, chord, 20 % >29 E/Ea, lat 7.3 ------ PLAX ann, tiss DP 6 LVPW, ED 9.85 mm ------ Ea, med ann, 5.0 cm/s ------ IVS/LVPW 1.18 <1.3 tiss DP 4 ratio, ED E/Ea, med 8.8 ------ Ventricular septum ann, tiss DP 1 IVS, ED 11.6 mm ------ Mitral valve Aorta Peak E vel 44. cm/s ------ Root diam, 31 mm ------ 4 ED Peak A vel 69. cm/s ------ Left atrium 1 AP dim 29 mm ------ Deceleration 257 ms 150-23 AP dim 2.1 cm/m^2 <2.2 time 0 index Peak E/A 0.6 ------ ratio Tricuspid valve Regurg peak 242 cm/s ------ vel Peak RV-RA 23 mm ------ gradient, S Hg Systemic veins Estimated CVP 10 mm ------ Hg Right ventricle Pressure, S 33 mm <30 Hg  ------------------------------------------------------------ Prepared and Electronically Authenticated by  Olga Millers 2013-08-08T14:34:50.300  ECho:   Ecg: NSR with Q waves in V1-2. ST elevation resolved.  PHYSICAL EXAM General: Well developed, well nourished, in no acute distress. Head: Normocephalic, atraumatic, sclera non-icteric, no xanthomas, nares are without discharge. Lips are chapped. No clear source of oral bleeding. Neck: Negative for carotid bruits. JVD not elevated. Lungs: Clear bilaterally to auscultation without wheezes, rales, or rhonchi. Breathing is unlabored. Heart: RRR S1 S2 without murmurs, rubs, or gallops.  Abdomen: Soft, non-tender, non-distended with normoactive bowel sounds. No hepatomegaly. No rebound/guarding. No obvious abdominal masses. Msk:  Strength and tone appears normal  for age. Extremities: No clubbing, cyanosis or edema.  Distal pedal pulses are 2+ and equal bilaterally. Right groin without hematoma. Neuro: Alert and oriented X 3. Moves all extremities spontaneously. Psych:  Responds to questions appropriately with a normal affect.  ASSESSMENT AND PLAN: 1. STEMI anterior. S/p DES proximal LAD. Continue ASA, Brilinta. Patient's BP remains soft  continue hold ACEi and beta blocker.    2. Hypercholesterolemia. On Lipitor. Need to split pill for patient to  swallow given history of oral cancer.  3. Hypokalemia improved.   4. Elevated blood glucose. A1c 5.8%.  5. Ischemic cardiomyopathy EF 30-35% by cath. 35-40% by Echo. Hold ACEi and carvedilol due to hypotension. If BP improves resume slowly at low dose.  6. Oral bleeding. Will stop SQ heparin. Continue ASA and Brilinta. CBC pending.  Will transfer to telemetry today.  Principal Problem:  *ST elevation myocardial infarction (STEMI) of anterior wall Active Problems:  Ventricular tachycardia  Ischemic cardiomyopathy  Hypercholesterolemia    Signed, Peter [place] MD,FACC 08/28/2011 7:00 AM

## 2011-08-29 NOTE — Progress Notes (Signed)
CARDIAC REHAB PHASE I   PRE:  Rate/Rhythm: 88  BP:  Supine:   Sitting: 91/63  Standing:    SaO2: 97%RA  MODE:  Ambulation: 550 ft   POST:  Rate/Rhythem: 99  BP:  Supine: Sitting: 92/58  Standing:    SaO2: 98% RA Patient ambulated times one assist. Tolerated well. No patient complaints. VSS. Gait steady. Back to side of bed with call bell in reach.  Mahmood Boehringer L. Manson Passey, MS, NASM, CES  BROWN, Yeimi Debnam L

## 2011-08-29 NOTE — Progress Notes (Signed)
Patient ID: Pamela Kidd, female   DOB: [birth year], 66 y.o.   MRN: [id number] Subjective:  No additional bleeding or chest pain or sob.  Objective:  Vital Signs in the last 24 hours: Temp:  [98.1 F (36.7 C)-98.9 F (37.2 C)] 98.4 F (36.9 C) (08/10 0500) Pulse Rate:  [73-85] 73  (08/10 0500) Resp:  [16-18] 16  (08/10 0500) BP: (73-85)/(50-61) 84/55 mmHg (08/10 0500) SpO2:  [95 %-100 %] 97 % (08/10 0500) Weight:  [108 lb (48.988 kg)] 108 lb (48.988 kg) (08/10 0500)  Intake/Output from previous day: 08/09 0701 - 08/10 0700 In: 140 [P.O.:140] Out: -  Intake/Output from this shift: Total I/O In: 240 [P.O.:240] Out: -   Physical Exam: Well appearing middle aged woman, NAD HEENT: Unremarkable Neck:  No JVD, no thyromegally Lungs:  Clear with no wheezes, rales, or rhonchi HEART:  Regular rate rhythm, no murmurs, no rubs, no clicks Abd:  Flat, positive bowel sounds, no organomegally, no rebound, no guarding Ext:  2 plus pulses, no edema, no cyanosis, no clubbing Skin:  No rashes no nodules Neuro:  CN II through XII intact, motor grossly intact  Lab Results:  Basename 08/28/11 0500 08/27/11 0545  WBC 10.0 8.9  HGB 13.6 13.3  PLT 257 242    Basename 08/28/11 0500 08/27/11 0545  NA 142 143  K 3.8 4.6  CL 107 109  CO2 23 26  GLUCOSE 118* 99  BUN 8 8  CREATININE 0.99 1.16*    Basename 08/26/11 1735 08/26/11 1125  TROPONINI >20.00* >20.00*   Hepatic Function Panel No results found for this basename: PROT,ALBUMIN,AST,ALT,ALKPHOS,BILITOT,BILIDIR,IBILI in the last 72 hours No results found for this basename: CHOL in the last 72 hours No results found for this basename: PROTIME in the last 72 hours  Imaging: No results found.  Cardiac Studies: Tele - NSR Assessment/Plan:  1. Acute STEMI 2. Hypotension 3. VT 4. Dyslipidemia Rec: she is improved today and blood pressure is a bit better. Will plan to restart lowest dose ACE inhibitor and beta blocker if BP stable  tomorrow.  LOS: 3 days    Buel Ream.D. 08/29/2011, 10:26 AM

## 2011-08-30 DIAGNOSIS — I219 Acute myocardial infarction, unspecified: Secondary | ICD-10-CM

## 2011-08-30 LAB — BASIC METABOLIC PANEL
CO2: 25 mEq/L (ref 19–32)
Chloride: 104 mEq/L (ref 96–112)
Glucose, Bld: 104 mg/dL — ABNORMAL HIGH (ref 70–99)
Potassium: 4.3 mEq/L (ref 3.5–5.1)
Sodium: 140 mEq/L (ref 135–145)

## 2011-08-30 LAB — CBC
Hemoglobin: 12.7 g/dL (ref 12.0–15.0)
MCH: 29.3 pg (ref 26.0–34.0)
MCV: 89.4 fL (ref 78.0–100.0)
RBC: 4.33 MIL/uL (ref 3.87–5.11)

## 2011-08-30 MED ORDER — RAMIPRIL 2.5 MG PO CAPS
2.5000 mg | ORAL_CAPSULE | Freq: Every day | ORAL | Status: DC
  Administered 2011-08-30 – 2011-08-31 (×2): 2.5 mg via ORAL
  Filled 2011-08-30 (×2): qty 1

## 2011-08-30 MED ORDER — CARVEDILOL 3.125 MG PO TABS
3.1250 mg | ORAL_TABLET | Freq: Two times a day (BID) | ORAL | Status: DC
  Administered 2011-08-30 – 2011-09-01 (×4): 3.125 mg via ORAL
  Filled 2011-08-30 (×6): qty 1

## 2011-08-30 NOTE — Progress Notes (Signed)
Patient ID: Pamela Kidd, female   DOB: 06/04/1945, 66 y.o.   MRN: [id number] Subjective:  No additional bleeding or chest pain or sob.  Objective:  Vital Signs in the last 24 hours: Temp:  [97.5 F (36.4 C)-98.4 F (36.9 C)] 98.2 F (36.8 C) (08/11 0500) Pulse Rate:  [77-88] 77  (08/11 0500) Resp:  [18] 18  (08/11 0500) BP: (85-95)/(58-64) 85/58 mmHg (08/11 0500) SpO2:  [97 %] 97 % (08/11 0500) Weight:  [105 lb 2.6 oz (47.7 kg)] 105 lb 2.6 oz (47.7 kg) (08/11 0500)  Intake/Output from previous day: 08/10 0701 - 08/11 0700 In: 720 [P.O.:720] Out: -  Intake/Output from this shift: Total I/O In: 240 [P.O.:240] Out: -   Physical Exam: Well appearing middle aged woman, NAD HEENT: Unremarkable Neck:  No JVD, no thyromegally Lungs:  Clear with no wheezes, rales, or rhonchi HEART:  Regular rate rhythm, no murmurs, no rubs, no clicks Abd:  Flat, positive bowel sounds, no organomegally, no rebound, no guarding Ext:  2 plus pulses, no edema, no cyanosis, no clubbing Skin:  No rashes no nodules Neuro:  CN II through XII intact, motor grossly intact  Lab Results:  Basename 08/30/11 0620 08/28/11 0500  WBC 10.1 10.0  HGB 12.7 13.6  PLT 246 257    Basename 08/30/11 0620 08/28/11 0500  NA 140 142  K 4.3 3.8  CL 104 107  CO2 25 23  GLUCOSE 104* 118*  BUN 12 8  CREATININE 1.06 0.99   No results found for this basename: TROPONINI:2,CK,MB:2 in the last 72 hours Hepatic Function Panel No results found for this basename: PROT,ALBUMIN,AST,ALT,ALKPHOS,BILITOT,BILIDIR,IBILI in the last 72 hours No results found for this basename: CHOL in the last 72 hours No results found for this basename: PROTIME in the last 72 hours  Imaging: No results found.  Cardiac Studies: Tele - NSR Assessment/Plan:  1. Acute STEMI 2. Hypotension 3. VT 4. Dyslipidemia Rec: she is improved today and blood pressure is a bit better. Will plan to restart lowest dose ACE inhibitor and beta blocker.  LOS: 4 days    Buel Ream.D. 08/30/2011, 9:29 AM

## 2011-08-31 DIAGNOSIS — Z9861 Coronary angioplasty status: Secondary | ICD-10-CM

## 2011-08-31 LAB — BASIC METABOLIC PANEL
CO2: 25 mEq/L (ref 19–32)
Calcium: 9.1 mg/dL (ref 8.4–10.5)
Creatinine, Ser: 0.98 mg/dL (ref 0.50–1.10)
Glucose, Bld: 139 mg/dL — ABNORMAL HIGH (ref 70–99)

## 2011-08-31 MED ORDER — RAMIPRIL 1.25 MG PO CAPS
1.2500 mg | ORAL_CAPSULE | Freq: Every day | ORAL | Status: DC
  Administered 2011-09-01: 1.25 mg via ORAL
  Filled 2011-08-31: qty 1

## 2011-08-31 NOTE — Progress Notes (Signed)
CARDIAC REHAB PHASE I   PRE:  Rate/Rhythm: 75 SR    BP: lying right 72/60 left 78/50, sitting 86/60, standing 90/60    SaO2:   MODE:  Ambulation: 550 ft   POST:  Rate/Rhythm: 93 SR    BP: sitting 76/56     SaO2:   BP low, pt asymptomatic. Denied dizziness. Sts she just took her betablocker recently. No problems walking. Will f/u. [id number]  Pamela Kidd CES, ACSM

## 2011-08-31 NOTE — Progress Notes (Signed)
Subjective: No dizziness  No CP  No SOB.  Walked 3x today. Objective: Filed Vitals:   08/31/11 0500 08/31/11 0916 08/31/11 1400 08/31/11 1756  BP: 88/53 92/63 78/47  92/69  Pulse: 75  71   Temp: 98.6 F (37 C)  98.3 F (36.8 C)   TempSrc: Oral  Oral   Resp: 18  20   Height:      Weight: 102 lb 6.4 oz (46.448 kg)     SpO2: 95% 84% 98%    Weight change: -2 lb 12.2 oz (-1.252 kg)  Intake/Output Summary (Last 24 hours) at 08/31/11 1805 Last data filed at 08/31/11 0900  Gross per 24 hour  Intake    240 ml  Output      0 ml  Net    240 ml    General: Alert, awake, oriented x3, in no acute distress Neck:  JVP is normal Heart: Regular rate and rhythm, without murmurs, rubs, gallops.  Lungs: Clear to auscultation.  No rales or wheezes. Exemities:  No edema.   Neuro: Grossly intact, nonfocal.  Tele:  SR Lab Results: Results for orders placed during the hospital encounter of 08/26/11 (from the past 24 hour(s))  BASIC METABOLIC PANEL     Status: Abnormal   Collection Time   08/31/11 10:00 AM      Component Value Range   Sodium 136  135 - 145 mEq/L   Potassium 4.3  3.5 - 5.1 mEq/L   Chloride 102  96 - 112 mEq/L   CO2 25  19 - 32 mEq/L   Glucose, Bld 139 (*) 70 - 99 mg/dL   BUN 11  6 - 23 mg/dL   Creatinine, Ser 4.09  0.50 - 1.10 mg/dL   Calcium 9.1  8.4 - 81.1 mg/dL   GFR calc non Af Amer 59 (*) >90 mL/min   GFR calc Af Amer 68 (*) >90 mL/min    Studies/Results: @RISRSLT24 @  Medications: Reviewed   Patient Active Hospital Problem List: ST elevation myocardial infarction (STEMI) of anterior wall (08/26/2011)   Assessment: Doing well.  BP is a little low   Will cut ramipril to 1.25 and follow.    Ventricular tachycardia (08/26/2011)   Assessment: No recurrence  Ischemic cardiomyopathy (08/26/2011)   Assessment: Volume is good  Hypercholesterolemia (08/26/2011)   Assessment: Continue lipitor      LOS: 5 days   Dietrich Pates 08/31/2011, 6:05 PM

## 2011-09-01 MED ORDER — PANTOPRAZOLE SODIUM 40 MG PO TBEC: 40.0000 mg | DELAYED_RELEASE_TABLET | Freq: Every day | ORAL | Status: DC

## 2011-09-01 MED ORDER — CARVEDILOL 3.125 MG PO TABS: 3.1250 mg | ORAL_TABLET | Freq: Two times a day (BID) | ORAL | Status: DC

## 2011-09-01 MED ORDER — TICAGRELOR 90 MG PO TABS: 90.0000 mg | ORAL_TABLET | Freq: Two times a day (BID) | ORAL | Status: DC

## 2011-09-01 MED ORDER — ATORVASTATIN CALCIUM 40 MG PO TABS: 80.0000 mg | ORAL_TABLET | Freq: Every day | ORAL | Status: DC

## 2011-09-01 MED ORDER — NITROGLYCERIN 0.4 MG SL SUBL: 0.4000 mg | SUBLINGUAL_TABLET | SUBLINGUAL | Status: DC | PRN

## 2011-09-01 MED ORDER — ASPIRIN 81 MG PO TBEC: 81.0000 mg | DELAYED_RELEASE_TABLET | Freq: Every day | ORAL | Status: DC

## 2011-09-01 NOTE — Discharge Summary (Signed)
CARDIOLOGY DISCHARGE SUMMARY    Patient ID: Pamela Kidd,  MRN: [id number], DOB/AGE: [birth year] 66 y.o.  Admit date: 08/26/2011 Discharge date: 09/01/2011  Primary Care Physician: Lynnea Ferrier, MD Primary Cardiologist: New to [place], Peter [place], MD  Primary Discharge Diagnosis:  1. Acute anterior wall MI 2. Ischemic CM, EF 35-40% 3. Hypotension 4. VT in setting of coronary ischemia 5. Dyslipidemia  Secondary Discharge Diagnoses:  1. Diverticulosis  Procedures This Admission:  1. Emergent left cardiac catheterization 08/26/2011 PROCEDURAL FINDINGS  Hemodynamics:  AO 118/74 with a mean of 94 mmHg  LV 112/18 mmHg  Coronary angiography:  Coronary dominance: right  Left mainstem: Short and without significant disease.  Left anterior descending (LAD): 100% occlusion in the proximal vessel.  Left circumflex (LCx): This is a large vessel with 3 marginal branches. There are mild irregularities less than 20%.  Right coronary artery (RCA): This is a dominant but relatively small vessel. There is a 20-30% narrowing in the mid vessel.  Left ventriculography: Left ventricular systolic function post intervention is abnormal, LVEF is estimated at 30-35 %, there is extensive anterior and apical akinesis. there is no significant mitral regurgitation  PCI Procedure Note: Following the diagnostic procedure, the decision was made to proceed with PCI. Brilinta 180 mg po was given. Weight-based bivalirudin was given for anticoagulation. Once a therapeutic ACT was achieved, a 6 [place] XB LAD 3.5 guide catheter was inserted. A pro-water coronary guidewire was used to cross the lesion. The lesion was predilated with a 2.5 mm balloon. With initial reperfusion the patient developed sustained polymorphic ventricular tachycardia/fibrillation and required a single DC shock. The lesion was then stented with a 2.75 x 23 mm Xience Xpedition stent. The stent was postdilated with a 2.75 mm noncompliant balloon  to 16 atmospheres. Following PCI, there was 0% residual stenosis and TIMI-3 flow. Final angiography confirmed an excellent result. Femoral hemostasis was achieved with manual compression. She had no further arrhythmias and was pain-free at the end of procedure. The patient tolerated the PCI procedure well. There were no immediate procedural complications. The patient was transferred to the post catheterization recovery area for further monitoring.  PCI Data:  Vessel - LAD/Segment - proximal  Percent Stenosis (pre) 100%  TIMI-flow 0  Stent 2.75 x 23 mm Xience Xpedition  Percent Stenosis (post) 0%  TIMI-flow (post) 3  Final Conclusions:  1. Single vessel occlusive coronary disease.  2. Severe left ventricular dysfunction.  3. Successful stenting of the proximal LAD with a drug-eluting stent.  Recommendations: Continue dual antiplatelet therapy for one year. Risk factor modification.  History and Hospital Course:  [name] is a pleasant 66 year old woman with no prior cardiac history who was admitted on 08/26/2011 with acute anterior wall MI. She underwent emergent cardiac catheterization which revealed single vessel obstructive CAD involving the proximal LAD which was successfully treated with PCI using a drug eluting stent. Of note, she had VT which was successfully terminated with 200J shock x 1 in the cath lab. She has not had any recurrence. She now has LV dysfunction with EF 35-40%. She was started on appropriate medical therapy, aspirin, Brilinta, carvedilol, statin and low dose ramipril. She remained hypotensive (but asymptomatic); therefore, ramipril was discontinued and her medications were not further up-titrated in the hospital. Hopefully this can be done when she returns for outpatient follow-up. She was evaluated by cardiac rehab. She remains hemodynamically stable. She is ambulating without difficulty. She has been seen, examined and deemed stable for  discharge today by Dr. Lewayne Bunting.     Discharge Vitals: Blood pressure 85/54, pulse 70, temperature 98.3 F (36.8 C), temperature source Oral, resp. rate 18, height 4\' 10"  (1.473 m), weight 103 lb 8 oz (46.947 kg), SpO2 97.00%.   Labs: Lab Results  Component Value Date   WBC 10.1 08/30/2011   HGB 12.7 08/30/2011   HCT 38.7 08/30/2011   MCV 89.4 08/30/2011   PLT 246 08/30/2011     Lab 08/31/11 1000 08/26/11 0605  NA 136 --  K 4.3 --  CL 102 --  CO2 25 --  BUN 11 --  CREATININE 0.98 --  CALCIUM 9.1 --  PROT -- 6.4  BILITOT -- 0.4  ALKPHOS -- 84  ALT -- 11  AST -- 32  GLUCOSE 139* --   Lab Results  Component Value Date   CKTOTAL 834* 08/26/2011   CKMB 69.8* 08/26/2011   TROPONINI >20.00* 08/26/2011    Lab Results  Component Value Date   CHOL 237* 08/26/2011   Lab Results  Component Value Date   HDL 57 08/26/2011   Lab Results  Component Value Date   LDLCALC 163* 08/26/2011   Lab Results  Component Value Date   TRIG 86 08/26/2011   Lab Results  Component Value Date   CHOLHDL 4.2 08/26/2011    Disposition:  The patient is being discharged in stable condition.  Follow-up: Follow-up Information    Follow up with Tereso Newcomer, PA on 09/15/2011. (At 12:10 PM for hospital follow-up)    Contact information:   [address] [place] [address] [phone]      Follow up with Peter [place], MD on 10/13/2011. (At 9:30 AM)    Contact information:   [address] [place] [address] [phone]        Discharge Medications:  Medication List  As of 09/01/2011  1:47 PM   TAKE these medications         aspirin 81 MG EC tablet   Take 1 tablet (81 mg total) by mouth daily.      atorvastatin 40 MG tablet   Commonly known as: LIPITOR   Take 2 tablets (80 mg total) by mouth daily at 6 PM.      carvedilol 3.125 MG tablet   Commonly known as: COREG   Take 1 tablet (3.125 mg total) by mouth 2 (two) times daily with a meal.      nitroGLYCERIN 0.4 MG SL  tablet   Commonly known as: NITROSTAT   Place 1 tablet (0.4 mg total) under the tongue every 5 (five) minutes x 3 doses as needed for chest pain.      OVER THE COUNTER MEDICATION   Take 1 tablet by mouth 2 (two) times daily with a meal. Fiber supplement.      OVER THE COUNTER MEDICATION   Take 1 tablet by mouth 2 (two) times daily with a meal. Diverticulitis medication.      pantoprazole 40 MG tablet   Commonly known as: PROTONIX   Take 1 tablet (40 mg total) by mouth daily at 12 noon.      Ticagrelor 90 MG Tabs tablet   Commonly known as: BRILINTA   Take 1 tablet (90 mg total) by mouth 2 (two) times daily.          Duration of Discharge Encounter: Greater than 30 minutes including physician time.  Signed, Rick Duff, PA-C 09/01/2011, 1:47 PM

## 2011-09-01 NOTE — Progress Notes (Signed)
Pt discharged by wheel chair to private vehicle home.

## 2011-09-01 NOTE — Progress Notes (Signed)
CARDIAC REHAB PHASE I   PRE:  Rate/Rhythm: 74 SR    BP: sitting 78/50    SaO2:   MODE:  Ambulation: 550 ft   POST:  Rate/Rhythm: 87 SR    BP: sitting 90/62     SaO2:   Tolerated well. BP still low, asymptomatic.  [id number]  Harriet Masson CES, ACSM

## 2011-09-01 NOTE — Progress Notes (Signed)
     Patient: Pamela Kidd Date of Encounter: 09/01/2011, 10:25 AM Admit date: 08/26/2011     Subjective  [name] is feeling well. She has been ambulating without difficulty. She has no complaints. She denies CP, SOB, palpitations, dizziness or syncope.   Objective  Physical Exam: Vitals: BP 85/54  Pulse 70  Temp 98.3 F (36.8 C) (Oral)  Resp 18  Ht 4\' 10"  (1.473 m)  Wt 103 lb 8 oz (46.947 kg)  BMI 21.63 kg/m2  SpO2 97% General: Well developed, well appearing 66 year old female in no acute distress. Neck: Supple. JVD not elevated. Lungs: Clear bilaterally to auscultation without wheezes, rales, or rhonchi. Breathing is unlabored. Heart: RRR S1 S2 without murmurs, rub or gallop.  Abdomen: Soft, non-distended. Extremities: No clubbing or cyanosis. No edema.  Distal pedal pulses are 2+ and equal bilaterally. Neuro: Alert and oriented X 3. Moves all extremities spontaneously. No focal deficits.  Intake/Output: No intake or output data in the 24 hours ending 09/01/11 1025  Inpatient Medications:  . aspirin EC  81 mg Oral Daily  . atorvastatin  80 mg Oral q1800  . carvedilol  3.125 mg Oral BID WC  . pantoprazole  40 mg Oral Q1200  . ramipril  1.25 mg Oral Daily  . ticagrelor  90 mg Oral BID   Labs:  Basename 08/31/11 1000 08/30/11 0620  NA 136 140  K 4.3 4.3  CL 102 104  CO2 25 25  GLUCOSE 139* 104*  BUN 11 12  CREATININE 0.98 1.06  CALCIUM 9.1 9.3  MG -- --  PHOS -- --    Basename 08/30/11 0620  WBC 10.1  NEUTROABS --  HGB 12.7  HCT 38.7  MCV 89.4  PLT 246   No results found for this basename: CKTOTAL:4,CKMB:4,TROPONINI:4 in the last 72 hours No components found with this basename: POCBNP:3 No results found for this basename: CHOL,HDL,LDLCALC,TRIG,CHOLHDL in the last 72 hours   Radiology/Studies: None in >48 hours  Echocardiogram: done 08/27/2011 Conclusions: - Left ventricle: The cavity size was normal. There was mild focal basal hypertrophy of the  septum. Systolic function was moderately reduced. The estimated ejection fraction was 40%, in the range of 35% to 40%. There is akinesis of the mid-distalanteroseptal and apical myocardium. Doppler parameters are consistent with abnormal left ventricular relaxation (grade 1 diastolic dysfunction). - Tricuspid valve: Mild-moderate regurgitation. - Pulmonary arteries: PA peak pressure: 33mm Hg (S).  Telemetry: normal sinus rhythm; no arrhythmias   Assessment and Plan  1. STEMI, anterior wall, s/p DES to proxLAD 2. Ishemic CM, EF 35-40% - continue carvedilol; BP low on low dose ACEI 3. Hypotension - may not be able to tolerate ACEI 4. VT in setting of coronary ischemia s/p 200J shock in cath lab - no recurrence 5. Dyslipidemia - continue statin  Dr. Ladona Ridgel to see and make further recommendations. Signed, Exie Parody  Cardiology Attending  Patient seen and examined. Agree with above plans and recommendations. Ok for discharge today.   Leonia Reeves.D.

## 2011-09-04 ENCOUNTER — Telehealth: Payer: Self-pay | Admitting: Cardiology

## 2011-09-04 NOTE — Telephone Encounter (Signed)
New msg Pt just got out of hospital and wanted to know if she can discuss her diet with you. Please call

## 2011-09-04 NOTE — Telephone Encounter (Signed)
Patient called no answer.LMTC. 

## 2011-09-07 ENCOUNTER — Encounter: Payer: Self-pay | Admitting: Nurse Practitioner

## 2011-09-07 ENCOUNTER — Ambulatory Visit (INDEPENDENT_AMBULATORY_CARE_PROVIDER_SITE_OTHER): Payer: Medicare Other | Admitting: Nurse Practitioner

## 2011-09-07 VITALS — BP 109/68 | HR 60 | Ht <= 58 in | Wt 104.8 lb

## 2011-09-07 DIAGNOSIS — R079 Chest pain, unspecified: Secondary | ICD-10-CM

## 2011-09-07 NOTE — Progress Notes (Signed)
Patient Name: Pamela Kidd Date of Encounter: 09/07/2011  Primary Care Provider:  Leo Grosser, MD Primary Cardiologist:  P. [place], MD  Patient Profile  66 y/o female with recent admission for acute ant stemi, who presents for f/u.  Problem List   Past Medical History  Diagnosis Date  . CAD (coronary artery disease)     a. 08/26/2011 Acute Ant STEMI/Cath/PCI: LM nl, LAD 100p -> 2.75x23 Xience Xpedition, LCX 20, minor irregs, RCA 20-54mid, EF 30-35%, anterior/apical AK  . Ischemic cardiomyopathy     a. 08/27/2011 Echo: EF 35-40%, mid-dist anteroseptal/apical akinesis, Gr 1 DD, mild-mod TR, PASP  . Hyperlipidemia   . Hypotension     a. preventing use of ACEI  . Oral cancer 2003    a. s/p radiation/chemotherapy  . Diverticulosis    Past Surgical History  Procedure Date  . Foot surgery   . Left shoulder   . Abdominal hysterectomy     partial    Allergies  Allergies  Allergen Reactions  . Ampicillin Itching  . Codeine Itching and Nausea And Vomiting  . Nubain (Nalbuphine Hcl) Other (See Comments)    "headache & disoriented"      HPI  66 y/o female with the above problem list.  She is recently s/p anterior STEMI complicated by VT requiring defib during cath.  EF post PCI was 35-40 % by echo.  While hospitalized, we were unable to initiate ACEI therapy secondary to hypotension.  Her coreg dose was limited to just 3.125mg  bid.  Since d/c, she had been doing well.  She is tolerating her coreg despite reported blood pressures of 80-85 systolic.  She says she has always had blood pressures in this range.  She has been going for walks and has not experienced any exertional chest pain or dyspnea.  This AM, she was brushing her teeth and developed mild, 3-4/10 substernal chest tightness, radiating through to her mid-scapular area without associated Ss.  She called her dtr who rec that she sit and take a SL NTG.  After doing this, she felt better in a few mins.  Total  duration of discomfort was roughly 10-15 mins.  Because of this experience, she called the office and was added onto my schedule today.  She has had no further chest discomfort since this AM.  She was able to walk into the office w/o Ss/limitations.  She denies pnd, orthopnea, n, v, dizziness, syncope, edema, weight gain, or early satiety.  Home Medications  Prior to Admission medications   Medication Sig Start Date End Date Taking? Authorizing Provider  aspirin EC 81 MG EC tablet Take 1 tablet (81 mg total) by mouth daily. 09/01/11 08/31/12 Yes Brooke O Edmisten, PA-C  atorvastatin (LIPITOR) 40 MG tablet Take 80 mg by mouth daily. Four 20mg  tablets at night. 09/01/11 08/31/12 Yes Brooke O Edmisten, PA-C  carvedilol (COREG) 3.125 MG tablet Take 1 tablet (3.125 mg total) by mouth 2 (two) times daily with a meal. 09/01/11 08/31/12 Yes Brooke O Edmisten, PA-C  nitroGLYCERIN (NITROSTAT) 0.4 MG SL tablet Place 1 tablet (0.4 mg total) under the tongue every 5 (five) minutes x 3 doses as needed for chest pain. 09/01/11 08/31/12 Yes Brooke O Edmisten, PA-C  OVER THE COUNTER MEDICATION Take 1 tablet by mouth 2 (two) times daily with a meal. Fiber supplement.   Yes Historical Provider, MD  OVER THE COUNTER MEDICATION Take 1 tablet by mouth 2 (two) times daily with a meal. Diverticulitis medication.  Yes Historical Provider, MD  pantoprazole (PROTONIX) 40 MG tablet Take 1 tablet (40 mg total) by mouth daily at 12 noon. 09/01/11 08/31/12 Yes Brooke O Edmisten, PA-C  Ticagrelor (BRILINTA) 90 MG TABS tablet Take 1 tablet (90 mg total) by mouth 2 (two) times daily. 09/01/11  Yes Brooke O Edmisten, PA-C    Review of Systems  1 episode of nitrate responsive chest pain this AM.  Otherwise all other systems reviewed and are otherwise negative except as noted above.  Physical Exam  Blood pressure 109/68, pulse 60, height 4\' 10"  (1.473 m), weight 104 lb 12.8 oz (47.537 kg), SpO2 99.00%.  General: Pleasant, NAD Psych:  Normal affect. Neuro: Alert and oriented X 3. Moves all extremities spontaneously. HEENT: Normal  Neck: Supple without bruits or JVD. Lungs:  Resp regular and unlabored, CTA. Heart: RRR no s3, s4, or murmurs. Abdomen: Soft, non-tender, non-distended, BS + x 4.  Extremities: No clubbing, cyanosis or edema. DP/PT/Radials 2+ and equal bilaterally.  Accessory Clinical Findings  ECG - RSR, 70, septal infarct, anterolateral twi with biphasic t's in inf leads. - not significantly changed.  Assessment & Plan  1.  CAD:  Pt is s/p PCI/DES of the LAD in the setting of acute MI last week.  She had an episode of chest tightness/squeezing earlier this AM, which was nitrate responsive.  This was somewhat similar in character to MI pain, though not nearly as severe.  ECG is not dramatically different than those obtained in the hospital.  I've reviewed her case with Dr. Eden Emms.  As she has had no further symptoms, we will continue medical therapy including asa, brilinta, bb, statin, prn nitrates.  I've advised that if she has recurrent symptoms, she should have a low threshold to present to the ED for evaluation/call 911.  She and her husband verbalized understanding.  2.  ICM:  euvolemic on exam.  Cont low-dose bb therapy.  We are neither able to titrate carvedilol nor add ACEI/ARB secondary to documented hypotn at home.  Though her pressure is better in the office today, she says that she generally runs in the low-mid 80's @ home.  Plan to f/u echo in 3 months to reassess EF and determine candidacy for ICD.  3.  Hypotension:  Asymptomatic.    4.  HL:  LDL 163 08/26/2011.  Cont high dose statin.  She is taking 2 40mg  tabs b/c she has trouble swallowing the 80mg  tab.  F/U lipids/lft's when she sees Dr. Swaziland in 6 wks.  5.  Disposition:  F/u Dr. Sw[place]iland in 6 wks as scheduled or sooner if necessary.  Nicolasa Ducking, NP 09/07/2011, 3:35 PM

## 2011-09-07 NOTE — Patient Instructions (Addendum)
Your physician recommends that you keep your follow-up appointment as scheduled with Dr Sw[place]iland

## 2011-09-07 NOTE — Telephone Encounter (Signed)
Pt calling stating she got out of hosp. Recently--admitted with MI--pt states 1 episode of CP this weekend--tightness in chest, radiating under shoulder blades--took 1 ntg with good relief, but very nervous, and states was not like CP she had with MI--pt states she would like to be seen--advised appoint set up with c berge today at 1:30 pm--pt agrees

## 2011-09-07 NOTE — Telephone Encounter (Signed)
New problem:  Patient calling C/O Tightness in chest - spoke with daughter in law whose a  hospice RN . Advise patient  to take a nitro table.  Symptoms Went away.  Patient is suggesting should her appt be move up sooner.

## 2011-09-15 ENCOUNTER — Encounter: Payer: Medicare Other | Admitting: Physician Assistant

## 2011-10-07 ENCOUNTER — Ambulatory Visit (INDEPENDENT_AMBULATORY_CARE_PROVIDER_SITE_OTHER): Payer: Medicare Other | Admitting: Cardiology

## 2011-10-07 ENCOUNTER — Encounter: Payer: Self-pay | Admitting: Cardiology

## 2011-10-07 VITALS — BP 100/60 | HR 66 | Ht <= 58 in | Wt 102.0 lb

## 2011-10-07 DIAGNOSIS — E7800 Pure hypercholesterolemia, unspecified: Secondary | ICD-10-CM

## 2011-10-07 DIAGNOSIS — I2109 ST elevation (STEMI) myocardial infarction involving other coronary artery of anterior wall: Secondary | ICD-10-CM

## 2011-10-07 DIAGNOSIS — I25810 Atherosclerosis of coronary artery bypass graft(s) without angina pectoris: Secondary | ICD-10-CM

## 2011-10-07 DIAGNOSIS — I255 Ischemic cardiomyopathy: Secondary | ICD-10-CM

## 2011-10-07 DIAGNOSIS — I472 Ventricular tachycardia, unspecified: Secondary | ICD-10-CM

## 2011-10-07 DIAGNOSIS — I2589 Other forms of chronic ischemic heart disease: Secondary | ICD-10-CM

## 2011-10-07 DIAGNOSIS — I2510 Atherosclerotic heart disease of native coronary artery without angina pectoris: Secondary | ICD-10-CM | POA: Insufficient documentation

## 2011-10-07 MED ORDER — ROSUVASTATIN CALCIUM 20 MG PO TABS: 20.0000 mg | ORAL_TABLET | Freq: Every day | ORAL | Status: DC

## 2011-10-07 NOTE — Patient Instructions (Addendum)
We will check lab work on you today and call with the results  Stop taking lipitor and protonix.  Start crestor 20 mg daily.  We will see you again in 2 months with an echocardiogram

## 2011-10-07 NOTE — Progress Notes (Signed)
Patient Name: Pamela Kidd Date of Encounter: 10/07/2011  Primary Care Provider:  Leo Grosser, MD Primary Cardiologist:  P. [place], MD  History of present illness:  66 y/o female with history of acute anterior myocardial infarction on 08/26/2011 treated with drug-eluting stent of the proximal LAD. On followup today she still feels weak. She is having some insomnia. She complains of frequent loose stools. This tends to occur especially after taking her Lipitor. She does feel depressed. She denies any shortness of breath or chest pain. She's had no significant dizziness. She denies any edema. She has had 2 or 3 episodes where she felt her heart racing very briefly. She is trying to walk at least 15 minutes a day. She is in the process of moving. Previous titration of medications was limited by low blood pressure.  Problem List   Past Medical History  Diagnosis Date  . CAD (coronary artery disease)     a. 08/26/2011 Acute Ant STEMI/Cath/PCI: LM nl, LAD 100p -> 2.75x23 Xience Xpedition, LCX 20, minor irregs, RCA 20-29mid, EF 30-35%, anterior/apical AK  . Ischemic cardiomyopathy     a. 08/27/2011 Echo: EF 35-40%, mid-dist anteroseptal/apical akinesis, Gr 1 DD, mild-mod TR, PASP  . Hyperlipidemia   . Hypotension     a. preventing use of ACEI  . Oral cancer 2003    a. s/p radiation/chemotherapy  . Diverticulosis    Past Surgical History  Procedure Date  . Foot surgery   . Left shoulder   . Abdominal hysterectomy     partial    Allergies  Allergies  Allergen Reactions  . Ampicillin Itching  . Codeine Itching and Nausea And Vomiting  . Nubain (Nalbuphine Hcl) Other (See Comments)    "headache & disoriented"        Home Medications  Prior to Admission medications   Medication Sig Start Date End Date Taking? Authorizing Provider  aspirin EC 81 MG EC tablet Take 1 tablet (81 mg total) by mouth daily. 09/01/11 08/31/12 Yes Brooke O Edmisten, PA-C  atorvastatin  (LIPITOR) 40 MG tablet Take 80 mg by mouth daily. Four 20mg  tablets at night. 09/01/11 08/31/12 Yes Brooke O Edmisten, PA-C  carvedilol (COREG) 3.125 MG tablet Take 1 tablet (3.125 mg total) by mouth 2 (two) times daily with a meal. 09/01/11 08/31/12 Yes Brooke O Edmisten, PA-C  nitroGLYCERIN (NITROSTAT) 0.4 MG SL tablet Place 1 tablet (0.4 mg total) under the tongue every 5 (five) minutes x 3 doses as needed for chest pain. 09/01/11 08/31/12 Yes Brooke O Edmisten, PA-C  OVER THE COUNTER MEDICATION Take 1 tablet by mouth 2 (two) times daily with a meal. Fiber supplement.   Yes Historical Provider, MD  OVER THE COUNTER MEDICATION Take 1 tablet by mouth 2 (two) times daily with a meal. Diverticulitis medication.   Yes Historical Provider, MD  pantoprazole (PROTONIX) 40 MG tablet Take 1 tablet (40 mg total) by mouth daily at 12 noon. 09/01/11 08/31/12 Yes Brooke O Edmisten, PA-C  Ticagrelor (BRILINTA) 90 MG TABS tablet Take 1 tablet (90 mg total) by mouth 2 (two) times daily. 09/01/11  Yes Brooke O Edmisten, PA-C    Review of Systems   Otherwise all other systems reviewed and are otherwise negative except as noted above.  Physical Exam  Blood pressure 100/60, pulse 66, height 4\' 10"  (1.473 m), weight 102 lb (46.267 kg).  General: Pleasant, NAD, thin Psych: Normal affect. Neuro: Alert and oriented X 3. Moves all extremities spontaneously. HEENT: Normal  Neck: Supple without bruits or JVD. Lungs:  Resp regular and unlabored, CTA. Heart: RRR no s3, s4, or murmurs. Abdomen: Soft, non-tender, non-distended, BS + x 4.  Extremities: No clubbing, cyanosis or edema. DP/PT/Radials 2+ and equal bilaterally.  Accessory Clinical Findings    Assessment & Plan  1.  CAD:  Pt is s/p PCI/DES of the LAD in the setting of acute MI in early August.  Need to continue dual antiplatelet therapy for one year. Further titration of her beta blocker and/or addition of an ACE inhibitor is limited by hypotension.  2.  ICM:   euvolemic on exam.  Cont low-dose bb therapy.  We are neither able to titrate carvedilol nor add ACEI/ARB secondary to documented hypotn at home.  Plan to f/u echo at 3 months to reassess EF and determine candidacy for ICD.  3.  Hypotension:  Asymptomatic.    4.  hyperlipidemia. She does appear to be having diarrhea related to Lipitor. We will try switching her to Crestor 20 mg daily.  5.  diarrhea: Possibly related to Lipitor. We will switch to Crestor. We'll also stop her Protonix.  6. Depression. Situational. We discussed potential medical treatment but she would like to wait.  7. Hypothyroidism. TSH was 7.1 in the hospital. We will repeat today. May need to start thyroid replacement.  Reg Bircher [place], MD,FACC 10/07/2011, 5:57 PM

## 2011-10-08 LAB — HEPATIC FUNCTION PANEL
ALT: 16 U/L (ref 0–35)
AST: 23 U/L (ref 0–37)
Albumin: 3.6 g/dL (ref 3.5–5.2)
Total Protein: 6.5 g/dL (ref 6.0–8.3)

## 2011-10-08 LAB — CBC WITH DIFFERENTIAL/PLATELET
Basophils Absolute: 0.1 10*3/uL (ref 0.0–0.1)
Eosinophils Absolute: 0.2 10*3/uL (ref 0.0–0.7)
Lymphocytes Relative: 10.7 % — ABNORMAL LOW (ref 12.0–46.0)
MCHC: 33 g/dL (ref 30.0–36.0)
Monocytes Relative: 6.3 % (ref 3.0–12.0)
Neutrophils Relative %: 80.6 % — ABNORMAL HIGH (ref 43.0–77.0)
Platelets: 275 10*3/uL (ref 150.0–400.0)
RDW: 13.6 % (ref 11.5–14.6)

## 2011-10-08 LAB — BASIC METABOLIC PANEL
Calcium: 8.5 mg/dL (ref 8.4–10.5)
GFR: 64.06 mL/min (ref >60.00)
Sodium: 137 mEq/L (ref 135–145)

## 2011-10-08 LAB — LIPID PANEL
HDL: 49.8 mg/dL (ref >39.00)
Triglycerides: 89 mg/dL (ref 0.0–149.0)

## 2011-10-08 LAB — TSH: TSH: 3.09 u[IU]/mL (ref 0.35–5.50)

## 2011-10-13 ENCOUNTER — Ambulatory Visit: Payer: Medicare Other | Admitting: Cardiology

## 2011-11-04 ENCOUNTER — Emergency Department (HOSPITAL_COMMUNITY): Payer: Medicare Other

## 2011-11-04 ENCOUNTER — Observation Stay (HOSPITAL_COMMUNITY)
Admission: EM | Admit: 2011-11-04 | Discharge: 2011-11-05 | Disposition: A | Discharge status: Home / Self Care | Payer: Medicare Other | Attending: Cardiovascular Disease | Admitting: Cardiovascular Disease

## 2011-11-04 ENCOUNTER — Encounter (HOSPITAL_COMMUNITY): Payer: Self-pay | Admitting: Emergency Medicine

## 2011-11-04 DIAGNOSIS — Z923 Personal history of irradiation: Secondary | ICD-10-CM | POA: Insufficient documentation

## 2011-11-04 DIAGNOSIS — Z85819 Personal history of malignant neoplasm of unspecified site of lip, oral cavity, and pharynx: Secondary | ICD-10-CM | POA: Insufficient documentation

## 2011-11-04 DIAGNOSIS — R079 Chest pain, unspecified: Principal | ICD-10-CM | POA: Insufficient documentation

## 2011-11-04 DIAGNOSIS — I25810 Atherosclerosis of coronary artery bypass graft(s) without angina pectoris: Secondary | ICD-10-CM

## 2011-11-04 DIAGNOSIS — I255 Ischemic cardiomyopathy: Secondary | ICD-10-CM | POA: Diagnosis present

## 2011-11-04 DIAGNOSIS — I2510 Atherosclerotic heart disease of native coronary artery without angina pectoris: Secondary | ICD-10-CM | POA: Diagnosis present

## 2011-11-04 DIAGNOSIS — R0789 Other chest pain: Secondary | ICD-10-CM

## 2011-11-04 DIAGNOSIS — I959 Hypotension, unspecified: Secondary | ICD-10-CM | POA: Diagnosis present

## 2011-11-04 DIAGNOSIS — E7800 Pure hypercholesterolemia, unspecified: Secondary | ICD-10-CM | POA: Diagnosis present

## 2011-11-04 LAB — BASIC METABOLIC PANEL
BUN: 13 mg/dL (ref 6–23)
Chloride: 106 mEq/L (ref 96–112)
GFR calc Af Amer: 78 mL/min — ABNORMAL LOW (ref >90)
GFR calc non Af Amer: 67 mL/min — ABNORMAL LOW (ref >90)
Glucose, Bld: 145 mg/dL — ABNORMAL HIGH (ref 70–99)
Potassium: 4.2 mEq/L (ref 3.5–5.1)
Sodium: 138 mEq/L (ref 135–145)

## 2011-11-04 LAB — TROPONIN I
Troponin I: <0.3 ng/mL (ref <0.30)
Troponin I: <0.3 ng/mL (ref <0.30)

## 2011-11-04 LAB — CREATININE, SERUM
Creatinine, Ser: 0.82 mg/dL (ref 0.50–1.10)
GFR calc non Af Amer: 73 mL/min — ABNORMAL LOW (ref >90)

## 2011-11-04 LAB — POCT I-STAT TROPONIN I

## 2011-11-04 LAB — CBC
Hemoglobin: 12.3 g/dL (ref 12.0–15.0)
Hemoglobin: 13.1 g/dL (ref 12.0–15.0)
MCH: 29.8 pg (ref 26.0–34.0)
MCHC: 33 g/dL (ref 30.0–36.0)
MCHC: 33.7 g/dL (ref 30.0–36.0)
Platelets: 248 10*3/uL (ref 150–400)
RBC: 4.19 MIL/uL (ref 3.87–5.11)
RBC: 4.4 MIL/uL (ref 3.87–5.11)

## 2011-11-04 MED ORDER — TICAGRELOR 90 MG PO TABS
90.0000 mg | ORAL_TABLET | Freq: Two times a day (BID) | ORAL | Status: DC
  Administered 2011-11-04 – 2011-11-05 (×3): 90 mg via ORAL
  Filled 2011-11-04 (×4): qty 1

## 2011-11-04 MED ORDER — NON FORMULARY: 20.0000 mg | Freq: Every day | Status: DC

## 2011-11-04 MED ORDER — NITROGLYCERIN 0.4 MG SL SUBL: 0.4000 mg | SUBLINGUAL_TABLET | SUBLINGUAL | Status: DC | PRN

## 2011-11-04 MED ORDER — CARVEDILOL 3.125 MG PO TABS
3.1250 mg | ORAL_TABLET | Freq: Two times a day (BID) | ORAL | Status: DC
  Administered 2011-11-04 – 2011-11-05 (×3): 3.125 mg via ORAL
  Filled 2011-11-04 (×5): qty 1

## 2011-11-04 MED ORDER — ASPIRIN EC 81 MG PO TBEC
81.0000 mg | DELAYED_RELEASE_TABLET | Freq: Every day | ORAL | Status: DC
  Administered 2011-11-04 – 2011-11-05 (×2): 81 mg via ORAL
  Filled 2011-11-04 (×2): qty 1

## 2011-11-04 MED ORDER — ROSUVASTATIN CALCIUM 20 MG PO TABS
20.0000 mg | ORAL_TABLET | Freq: Every day | ORAL | Status: DC
  Administered 2011-11-04 – 2011-11-05 (×2): 20 mg via ORAL
  Filled 2011-11-04 (×2): qty 1

## 2011-11-04 MED ORDER — ALPRAZOLAM 0.25 MG PO TABS: 0.2500 mg | ORAL_TABLET | Freq: Two times a day (BID) | ORAL | Status: DC | PRN

## 2011-11-04 MED ORDER — ASPIRIN 81 MG PO CHEW: 324.0000 mg | CHEWABLE_TABLET | Freq: Once | ORAL | Status: AC

## 2011-11-04 MED ORDER — ONDANSETRON HCL 4 MG/2ML IJ SOLN: 4.0000 mg | Freq: Four times a day (QID) | INTRAMUSCULAR | Status: DC | PRN

## 2011-11-04 MED ORDER — ZOLPIDEM TARTRATE 5 MG PO TABS: 5.0000 mg | ORAL_TABLET | Freq: Every evening | ORAL | Status: DC | PRN

## 2011-11-04 MED ORDER — ACETAMINOPHEN 325 MG PO TABS
650.0000 mg | ORAL_TABLET | ORAL | Status: DC | PRN
  Administered 2011-11-04: 650 mg via ORAL
  Filled 2011-11-04: qty 2

## 2011-11-04 MED ORDER — PANTOPRAZOLE SODIUM 20 MG PO TBEC
20.0000 mg | DELAYED_RELEASE_TABLET | Freq: Every day | ORAL | Status: DC
  Administered 2011-11-05: 20 mg via ORAL
  Filled 2011-11-04: qty 1

## 2011-11-04 MED ORDER — SODIUM CHLORIDE 0.9 % IV BOLUS (SEPSIS): 500.0000 mL | Freq: Once | INTRAVENOUS | Status: AC

## 2011-11-04 MED ORDER — ENOXAPARIN SODIUM 40 MG/0.4ML ~~LOC~~ SOLN
40.0000 mg | SUBCUTANEOUS | Status: DC
  Administered 2011-11-04: 40 mg via SUBCUTANEOUS
  Filled 2011-11-04 (×2): qty 0.4

## 2011-11-04 NOTE — ED Provider Notes (Signed)
History     CSN: [id number]  Arrival date & time 11/04/11  0903   First MD Initiated Contact with Patient 11/04/11 0914      Chief Complaint  Patient presents with  . Chest Pain    (Consider location/radiation/quality/duration/timing/severity/associated sxs/prior treatment) HPI Pt presents with c/o chest pain.  She was sitting in bed and developed chest pain located in her mid chest radiating under her left breast.  No sob, she did have diaphoresis with this.  No fever/cough.  She then took a nitroglycerin and felt very faint- things went black- and she nearly syncopized but did remain conscious.  She had a recent STEMI with stent placement in 8/13.  She is currently chest pain free.  There are no other associated systemic symptoms, there are no other alleviating or modifying factors.   Past Medical History  Diagnosis Date  . CAD (coronary artery disease)     a. 08/26/2011 Acute Ant STEMI/Cath/PCI: LM nl, LAD 100p -> 2.75x23 Xience Xpedition, LCX 20, minor irregs, RCA 20-56mid, EF 30-35%, anterior/apical AK  . Ischemic cardiomyopathy     a. 08/27/2011 Echo: EF 35-40%, mid-dist anteroseptal/apical akinesis, Gr 1 DD, mild-mod TR, PASP  . Hyperlipidemia   . Hypotension     a. preventing use of ACEI  . Oral cancer 2003    a. s/p radiation/chemotherapy  . Diverticulosis     Past Surgical History  Procedure Date  . Foot surgery   . Left shoulder   . Abdominal hysterectomy     partial    No family history on file.  History  Substance Use Topics  . Smoking status: Never Smoker   . Smokeless tobacco: Never Used  . Alcohol Use: No    OB History    Grav Para Term Preterm Abortions TAB SAB Ect Mult Living                  Review of Systems ROS reviewed and all otherwise negative except for mentioned in HPI  Allergies  Ampicillin; Codeine; and Nubain  Home Medications   No current outpatient prescriptions on file.  BP 96/50  Pulse 65  Temp 98.5 F (36.9 C)  (Oral)  Resp 18  Ht 4\' 10"  (1.473 m)  Wt 99 lb 3.3 oz (45 kg)  BMI 20.73 kg/m2  SpO2 98% Vitals reviewed- pt states 80/60 is normal for her blood pressure Physical Exam Physical Examination: General appearance - alert, well appearing, and in no distress Mental status - alert, oriented to person, place, and time Eyes - no conjunctival injection, no scleral icterus Mouth - mucous membranes moist, pharynx normal without lesions Chest - clear to auscultation, no wheezes, rales or rhonchi, symmetric air entry Heart - normal rate, regular rhythm, normal S1, S2, no murmurs, rubs, clicks or gallops Abdomen - soft, nontender, nondistended, no masses or organomegaly Extremities - peripheral pulses normal, no pedal edema, no clubbing or cyanosis Skin - normal coloration and turgor, no rashes  ED Course  Procedures (including critical care time)   Date: 11/04/2011  Rate: 57  Rhythm: sinus bradycardia  QRS Axis: normal  Intervals: normal  ST/T Wave abnormalities: nonspecific T wave changes  Conduction Disutrbances:none  Narrative Interpretation:   Old EKG Reviewed: unchanged compared to prior EKG of 08/30/11    11:03 AM  D/w [facility] cardiology, they will see patient in the ED  12:27 PM  D/w Dr. Excell Seltzer, pt is not a candidate for CDU observation due to PTCI and known CAD.  Cardiology will admit to observation for rule out.   Labs Reviewed  BASIC METABOLIC PANEL - Abnormal; Notable for the following:    Glucose, Bld 145 (*)     GFR calc non Af Amer 67 (*)     GFR calc Af Amer 78 (*)     All other components within normal limits  CREATININE, SERUM - Abnormal; Notable for the following:    GFR calc non Af Amer 73 (*)     GFR calc Af Amer 85 (*)     All other components within normal limits  CBC  POCT I-STAT TROPONIN I  CBC  TROPONIN I  TROPONIN I   Dg Chest 2 View  11/04/2011  *RADIOLOGY REPORT*  Clinical Data: Chest pain  CHEST - 2 VIEW  Comparison: 08/26/2011  Findings:  Cardiomediastinal silhouette is stable.  No acute infiltrate or pulmonary edema.  Bony thorax is stable.  Probable round artifact overlying left upper chest.  IMPRESSION: No active disease.   Original Report Authenticated By: Natasha Mead, M.D.      1. Chest pain       MDM  Pt with hx of stemi and stent placement in 8/13 presenting with chest pain this morning.  Chest pain free in the ED.  EKG and labs reassuring, but story concerning for unstable angina/ACS.  D/w cardiology and they are planning to admit patient for further evalatuion.          [name], MD 11/04/11 [phone]

## 2011-11-04 NOTE — H&P (Signed)
History and Physical   Patient ID: Pamela Kidd MRN: [id number], DOB/AGE: 66-22-1947   Admit date: 11/04/2011 Date of Consult: 11/04/2011   Primary Physician: Leo Grosser, MD Primary Cardiologist: Sw[place], Peter, MD  HPI: Pamela Kidd is a 66yo female with PMHx significant for CAD (recent STEMI 08/26/11 s/p DES-LAD; see angiographic data below), ischemic CM (echo 08/27/11- LVEF 35-40%, grade 1 dias dysfxn, WMAs, mild pHTN), HL, hypotension (persistent, asymptomatic, preventing additional antihypertensive use) and hx of oral CA (s/p R/CXT) who presents to [facility] ED w/ chest pain.   She followed up last month in the office. She had been doing well. She noted some diarrhea with Lipitor, which was replaced with Crestor. PPI was held. TSH was re-checked and returned WNL. She denies ischemic, arrhythmic or heart failure-type symptoms. BP remained low preventing up-titration of BB or addition of ACEi/ARB.  She was in her USOH when she suddenly experienced substernal/L-sided chest aching rated at a 3/10 without radiation and associated diaphoresis. No exertional component- walks 15-30 min daily without incident. She did just finish eating grits. Denies shortness of breath, PND, DOE, orthopnea, lightheadedness, palpitations, n/v/d or active bleeding. She reports medication compliance. She took NTG SL x 1 with improvement. Her pain persisted for 30-45 minutes, then relieved completely. The discomfort was similar in quality, but less severe than her prior MI. EMS was called and transported her to the ED. She was given a full-dose ASA in transit.   There, EKG revealed anterior TWIs (c/w with LAD territory), otherwise no evidence of ischemia. Initial trop-I WNL. CXR without active disease. CBC/BMET unremarkable.   Problem List: Past Medical History  Diagnosis Date  . CAD (coronary artery disease)     a. 08/26/2011 Acute Ant STEMI/Cath/PCI: LM nl, LAD 100p -> 2.75x23 Xience Xpedition, LCX 20, minor irregs, RCA  20-69mid, EF 30-35%, anterior/apical AK  . Ischemic cardiomyopathy     a. 08/27/2011 Echo: EF 35-40%, mid-dist anteroseptal/apical akinesis, Gr 1 DD, mild-mod TR, PASP  . Hyperlipidemia   . Hypotension     a. preventing use of ACEI  . Oral cancer 2003    a. s/p radiation/chemotherapy  . Diverticulosis     Past Surgical History  Procedure Date  . Foot surgery   . Left shoulder   . Abdominal hysterectomy     partial     Allergies:  Allergies  Allergen Reactions  . Ampicillin Itching  . Codeine Itching and Nausea And Vomiting  . Nubain (Nalbuphine Hcl) Other (See Comments)    "headache & disoriented"      Home Medications: Prior to Admission medications   Medication Sig Start Date End Date Taking? Authorizing Provider  aspirin EC 81 MG EC tablet Take 1 tablet (81 mg total) by mouth daily. 09/01/11 08/31/12 Yes Brooke O Edmisten, PA-C  carvedilol (COREG) 3.125 MG tablet Take 1 tablet (3.125 mg total) by mouth 2 (two) times daily with a meal. 09/01/11 08/31/12 Yes Brooke O Edmisten, PA-C  nitroGLYCERIN (NITROSTAT) 0.4 MG SL tablet Place 1 tablet (0.4 mg total) under the tongue every 5 (five) minutes x 3 doses as needed for chest pain. 09/01/11 08/31/12 Yes Brooke O Edmisten, PA-C  OVER THE COUNTER MEDICATION Take 1 tablet by mouth 2 (two) times daily with a meal. Diverticulitis medication.   Yes Historical Provider, MD  rosuvastatin (CRESTOR) 20 MG tablet Take 1 tablet (20 mg total) by mouth daily. 10/07/11  Yes Peter M [place], MD  Ticagrelor (BRILINTA) 90 MG TABS tablet Take 1  tablet (90 mg total) by mouth 2 (two) times daily. 09/01/11  Yes Minda Meo, PA-C    Inpatient Medications:     . aspirin  324 mg Oral Once  . sodium chloride  500 mL Intravenous Once    (Not in a hospital admission)  No family history on file.   History   Social History  . Marital Status: Married    Spouse Name: N/A    Number of Children: N/A  . Years of Education: N/A   Occupational  History  . Not on file.   Social History Main Topics  . Smoking status: Never Smoker   . Smokeless tobacco: Never Used  . Alcohol Use: No  . Drug Use: No  . Sexually Active: Not on file   Other Topics Concern  . Not on file   Social History Narrative  . No narrative on file     Review of Systems: General: positive for diaphoresis, negative for chills, fever, night sweats or weight changes.  Cardiovascular: positive for chest pain, negative for dyspnea on exertion, edema, orthopnea, palpitations, paroxysmal nocturnal dyspnea or shortness of breath Dermatological: negative for rash Respiratory: negative for cough or wheezing Urologic: negative for hematuria Abdominal: negative for nausea, vomiting, diarrhea, bright red blood per rectum, melena, or hematemesis Neurologic:  negative for visual changes, syncope, or dizziness All other systems reviewed and are otherwise negative except as noted above.  Physical Exam: Blood pressure 89/56, pulse 61, temperature 98.7 F (37.1 C), temperature source Oral, resp. rate 8, height 4\' 10"  (1.473 m), weight 45.36 kg (100 lb), SpO2 95.00%.    General: Elderly, thin, well developed, in no acute distress. Head: Normocephalic, atraumatic, sclera non-icteric, no xanthomas, nares are without discharge.  Neck: Negative for carotid bruits. JVD not elevated. Lungs: Clear bilaterally to auscultation without wheezes, rales, or rhonchi. Breathing is unlabored. Heart: RRR with S1 S2. No murmurs, rubs, or gallops appreciated. Abdomen: Soft, non-tender, non-distended with normoactive bowel sounds. No hepatomegaly. No rebound/guarding. No obvious abdominal masses. Msk:  Strength and tone appears normal for age. Extremities: No clubbing, cyanosis or edema.  Distal pedal pulses are 2+ and equal bilaterally. Neuro:  Alert and oriented X 3. Moves all extremities spontaneously. Psych:  Responds to questions appropriately with a normal affect.  Labs: Recent  Labs  Basename 11/04/11 1006   WBC 9.5   HGB 12.3   HCT 37.3   MCV 89.0   PLT 225   Lab 11/04/11 1006  NA 138  K 4.2  CL 106  CO2 25  BUN 13  CREATININE 0.88  CALCIUM 8.6  PROT --  BILITOT --  ALKPHOS --  ALT --  AST --  AMYLASE --  LIPASE --  GLUCOSE 145*    Radiology/Studies: Dg Chest 2 View  11/04/2011  *RADIOLOGY REPORT*  Clinical Data: Chest pain  CHEST - 2 VIEW  Comparison: 08/26/2011  Findings: Cardiomediastinal silhouette is stable.  No acute infiltrate or pulmonary edema.  Bony thorax is stable.  Probable round artifact overlying left upper chest.  IMPRESSION: No active disease.   Original Report Authenticated By: Natasha Mead, M.D.     EKG: sinus bradycardia, 57 bpm, TWIs V1, V2, no ST changes  ASSESSMENT AND PLAN:  1. Chest pain- atypical/typical components. Had been in USOH and tolerating moderate exercise without any issues. She experienced chest pain this AM shortly after eating. No exertional component. Reminiscent of prior MI, but less severe. The fact that it was improved by NTG  SL does not reliably discriminate between angina or GI/esophageal etiologies. Of note, PPI had recently been discontinued also.  She has been compliant with all cardiac meds including DAPT- ASA/Brilinta. Initial trop-I negative. EKG without ischemic changes. Given her cardiac history, recent STEMI and cardiac RFs, will plan to observe overnight, formally rule-out and schedule outpatient Lexiscan Myoview in the office this week. Continue home meds. Restart PPI for prophylaxis and monitor tolerability.   2. CAD- continue ASA/Brilinta/low-dose BB/NTG SL PRN. Hypotension has limited up-titration of BB and/or addition of ACEi/ARB.   3. Ischemic CM- LVEF 35-40%, grade 1 diastolic dysfxn, mid-distalanteroseptal and apical AK, mild-moderate TR, PASP 33 mmHg on 08/27/11. Repeat echo scheduled for 11/2011 to reassess LVEF and candidacy for ICD implantation. Euvolemic on exam.   4. Hypotension-  asymptomatic. Limited addition/up-titration of antihypertensives as part of cardiac medication regimen.  5. HL- tolerating Crestor much better. There was concern that Lipitor was causing diarrhea. Continue.     Signed, R. Hurman Horn, PA-C 11/04/2011, 11:53 AM   Patient seen, examined. Available data reviewed. Agree with findings, assessment, and plan as outlined by Hurman Horn, PA-C. The patient was independently interviewed and examined. Her exam is unremarkable with clear lung fields and normal heart sounds without murmur or gallop. There is no peripheral edema present. I have reviewed her EKGs and lab work. Her pain episode this morning was a single event of approximately 30 minutes. She runs a blood pressure in the low-normal range and I suspect sublingual nitroglycerin caused hypotension and subsequent near syncope. I agree that the best course of action is to rule her out with serial cardiac enzymes. I think it would be reasonable to do an outpatient Myoview scan to evaluate for residual ischemia if she rules out for MI. I would not recommend placing her on IV heparin unless she develops recurrent chest pain. Her current medications will be continued. There is no room to increase her antianginal drugs because of low blood pressure.  Tonny Bollman, M.D. 11/04/2011 3:27 PM

## 2011-11-04 NOTE — ED Notes (Signed)
Pt brought in via EMS for onset of CP that started this AM. Pt hx of MI with stents 08/2011. Pt took SL Nitro x2 at onset of discomfort and EMS gave BASA x4. 20g Lt FA started by EMS.

## 2011-11-04 NOTE — Progress Notes (Signed)
Utilization review completed.  

## 2011-11-04 NOTE — ED Notes (Signed)
Pt reports CP began when she was in bed eating. Pt reports midsternal CP that radiated under left breast. Pt c/o HA at present time "from the nitro". Pt reports pain free at this time.

## 2011-11-05 DIAGNOSIS — E7800 Pure hypercholesterolemia, unspecified: Secondary | ICD-10-CM

## 2011-11-05 DIAGNOSIS — I2589 Other forms of chronic ischemic heart disease: Secondary | ICD-10-CM

## 2011-11-05 DIAGNOSIS — I25810 Atherosclerosis of coronary artery bypass graft(s) without angina pectoris: Secondary | ICD-10-CM

## 2011-11-05 DIAGNOSIS — R0789 Other chest pain: Secondary | ICD-10-CM | POA: Diagnosis present

## 2011-11-05 MED ORDER — PANTOPRAZOLE SODIUM 20 MG PO TBEC: 20.0000 mg | DELAYED_RELEASE_TABLET | Freq: Every day | ORAL | Status: DC

## 2011-11-05 NOTE — Progress Notes (Signed)
   TELEMETRY: Reviewed telemetry pt in sinus brady: Filed Vitals:   11/04/11 1330 11/04/11 1350 11/04/11 2150 11/05/11 0539  BP: 94/73 96/50 97/65  91/61  Pulse: 61 65 63 66  Temp:  98.5 F (36.9 C) 98.8 F (37.1 C) 98.5 F (36.9 C)  TempSrc:  Oral Oral Oral  Resp: 21 18 15 15   Height:  4\' 10"  (1.473 m)    Weight:  45 kg (99 lb 3.3 oz)    SpO2: 99% 98% 97% 99%   No intake or output data in the 24 hours ending 11/05/11 0737  SUBJECTIVE No further chest pain. Feels better today.  LABS: Basic Metabolic Panel:  Basename 11/04/11 1430 11/04/11 1006  NA -- 138  K -- 4.2  CL -- 106  CO2 -- 25  GLUCOSE -- 145*  BUN -- 13  CREATININE 0.82 0.88  CALCIUM -- 8.6  MG -- --  PHOS -- --   CBC:  Basename 11/04/11 1430 11/04/11 1006  WBC 8.5 9.5  NEUTROABS -- --  HGB 13.1 12.3  HCT 38.9 37.3  MCV 88.4 89.0  PLT 248 225   Cardiac Enzymes:  Basename 11/04/11 2238 11/04/11 1605  CKTOTAL -- --  CKMB -- --  CKMBINDEX -- --  TROPONINI <0.30 <0.30   Radiology/Studies:  Dg Chest 2 View  11/04/2011  *RADIOLOGY REPORT*  Clinical Data: Chest pain  CHEST - 2 VIEW  Comparison: 08/26/2011  Findings: Cardiomediastinal silhouette is stable.  No acute infiltrate or pulmonary edema.  Bony thorax is stable.  Probable round artifact overlying left upper chest.  IMPRESSION: No active disease.   Original Report Authenticated By: Natasha Mead, M.D.     PHYSICAL EXAM General: Well developed, well nourished, in no acute distress. Head: Normocephalic, atraumatic, sclera non-icteric, no xanthomas, nares are without discharge. Neck: Negative for carotid bruits. JVD not elevated. Lungs: Clear bilaterally to auscultation without wheezes, rales, or rhonchi. Breathing is unlabored. Heart: RRR S1 S2 without murmurs, rubs, or gallops.  Abdomen: Soft, non-tender, non-distended with normoactive bowel sounds. No hepatomegaly. No rebound/guarding. No obvious abdominal masses. Msk:  Strength and tone appears  normal for age. Extremities: No clubbing, cyanosis or edema.  Distal pedal pulses are 2+ and equal bilaterally. Neuro: Alert and oriented X 3. Moves all extremities spontaneously. Psych:  Responds to questions appropriately with a normal affect.  ASSESSMENT AND PLAN: 1. Chest pain. Resolved. Normal cardiac enzymes and Ecg is nonacute. Difficult to tell whether this episode was cardiac or GI related. I would recommend discharge today. Continue ASA, Brilinta, and low dose Coreg. We will arrange outpatient stress myoview in our office.  2. History of Anterior MI s/p stent of LAD in 8/13.  3. Hyperlipidemia on Crestor.  4. Ischemic cardiomyopathy. EF 35-40%.  Active Problems:  Ischemic cardiomyopathy  Hypercholesterolemia  CAD (coronary artery disease) of artery bypass graft  Hypotension    Signed, Negin Hegg [place] MD,FACC 11/05/2011 7:43 AM

## 2011-11-05 NOTE — Discharge Summary (Signed)
Patient seen and examined and history reviewed. Agree with above findings and plan. See my rounding note earlier today.  Theron Arista [facility] 11/05/2011 12:13 PM

## 2011-11-05 NOTE — Discharge Summary (Signed)
CARDIOLOGY DISCHARGE SUMMARY   Patient ID: Pamela Kidd MRN: [id number] DOB/AGE: Oct 24, 1945 66 y.o.  Admit date: 11/04/2011 Discharge date: 11/05/2011  Primary Discharge Diagnosis:  Chest pain - mid-sternal, cardiac enzymes negative for MI and outpatient Myoview scheduled Secondary Discharge Diagnosis:   Ischemic cardiomyopathy  Hypercholesterolemia  Hypotension CAD   Procedures: None  Hospital Course: Pamela Kidd is a 66 year old female with a history of CAD and persistent hypotension. She had chest pain and came to the hospital where she was admitted for further evaluation and treatment.   Her cardiac enzymes were negative for MI. She was in sinus bradycardia and her BP was in the 90s, but she was able to tolerate low-dose Coreg. Her PPI had previously been held for diarrhea, but was restarted as her symptoms may have been GI in origin. She had no significant anemia and no history of melena, so no further GI workup was pursued as an inpatient.   On 10/17, Pamela Kidd was pain-free and ambulating well. She was evaluated by Dr Swaziland and considered stable for discharge, to follow up as an outpatient.  Labs:   Lab Results  Component Value Date   WBC 8.5 11/04/2011   HGB 13.1 11/04/2011   HCT 38.9 11/04/2011   MCV 88.4 11/04/2011   PLT 248 11/04/2011     Lab 11/04/11 1430 11/04/11 1006  NA -- 138  K -- 4.2  CL -- 106  CO2 -- 25  BUN -- 13  CREATININE 0.82 --  CALCIUM -- 8.6  PROT -- --  BILITOT -- --  ALKPHOS -- --  ALT -- --  AST -- --  GLUCOSE -- 145*    Basename 11/04/11 2238 11/04/11 1605  CKTOTAL -- --  CKMB -- --  CKMBINDEX -- --  TROPONINI <0.30 <0.30      Radiology: Dg Chest 2 View 11/04/2011  *RADIOLOGY REPORT*  Clinical Data: Chest pain  CHEST - 2 VIEW  Comparison: 08/26/2011  Findings: Cardiomediastinal silhouette is stable.  No acute infiltrate or pulmonary edema.  Bony thorax is stable.  Probable round artifact overlying left upper chest.   IMPRESSION: No active disease.   Original Report Authenticated By: Natasha Mead, M.D.     EKG:04-Nov-2011 09:11:59 [facility]/ED ROUTINE RECORD SINUS RHYTHM ~ normal P axis, V-rate 50- 99 EARLY PRECORDIAL R/S TRANSITION ~ QRS area positive in V2 BORDERLINE T ABNORMALITIES, ANT-LAT LEADS ~ T flat/neg, I aVL V2-V6 Borderline ECG No significant change since last tracing 48mm/s 77mm/mV 150Hz  8.0.1 12SL 235 CID: [id number] Referred by: Confirmed By: Doug Sou MD Vent. rate 57 BPM PR interval 160 Pamela QRS duration 70 Pamela QT/QTc 396/385 Pamela P-R-T axes 60 23 85   FOLLOW UP PLANS AND APPOINTMENTS Allergies  Allergen Reactions  . Ampicillin Itching  . Codeine Itching and Nausea And Vomiting  . Nubain (Nalbuphine Hcl) Other (See Comments)    "headache & disoriented"       Medication List     As of 11/05/2011 11:28 AM    TAKE these medications         aspirin 81 MG EC tablet   Take 1 tablet (81 mg total) by mouth daily.      carvedilol 3.125 MG tablet   Commonly known as: COREG   Take 1 tablet (3.125 mg total) by mouth 2 (two) times daily with a meal.      nitroGLYCERIN 0.4 MG SL tablet   Commonly known as: NITROSTAT   Place  1 tablet (0.4 mg total) under the tongue every 5 (five) minutes x 3 doses as needed for chest pain.      OVER THE COUNTER MEDICATION   Take 1 tablet by mouth 2 (two) times daily with a meal. Diverticulitis medication.      pantoprazole 20 MG tablet   Commonly known as: PROTONIX   Take 1 tablet (20 mg total) by mouth daily.      rosuvastatin 20 MG tablet   Commonly known as: CRESTOR   Take 1 tablet (20 mg total) by mouth daily.      Ticagrelor 90 MG Tabs tablet   Commonly known as: BRILINTA   Take 1 tablet (90 mg total) by mouth 2 (two) times daily.         Discharge Orders    Future Appointments: Provider: Department: Dept Phone: Center:   11/10/2011 8:15 AM [facility] Nuclear 1 ([place]) [facility] Nuclear Med [phone] None    12/01/2011 1:00 PM [facility] Echo 1 [facility] Echo Lab [phone] None   12/08/2011 11:45 AM Peter M [place], MD [facility] [phone] [facility]     Future Orders Please Complete By Expires   Diet - low sodium heart healthy      Increase activity slowly        Follow-up Information    Follow up with [facility] On 11/10/2011. (At 8:15 AM for stess test. )    Contact information:   [address] [place] [address]       Follow up with  [facility] On 12/01/2011. (Echocardiogram at 1:00 pm)    Contact information:   [address] [place] [address]       Follow up with Peter [place], MD. On 12/08/2011. (at 11:45 am)    Contact information:   [address] [place] [address] [phone]          BRING ALL MEDICATIONS WITH YOU TO FOLLOW UP APPOINTMENTS  Time spent with patient to include physician time: 41 min Signed: Theodore Demark 11/05/2011, 11:28 AM Co-Sign MD

## 2011-11-10 ENCOUNTER — Ambulatory Visit (HOSPITAL_COMMUNITY): Payer: Medicare Other | Attending: Cardiology | Admitting: Radiology

## 2011-11-10 VITALS — BP 125/81 | Ht <= 58 in | Wt 100.0 lb

## 2011-11-10 DIAGNOSIS — I252 Old myocardial infarction: Secondary | ICD-10-CM | POA: Insufficient documentation

## 2011-11-10 DIAGNOSIS — R079 Chest pain, unspecified: Secondary | ICD-10-CM

## 2011-11-10 DIAGNOSIS — I2510 Atherosclerotic heart disease of native coronary artery without angina pectoris: Secondary | ICD-10-CM

## 2011-11-10 DIAGNOSIS — Z85819 Personal history of malignant neoplasm of unspecified site of lip, oral cavity, and pharynx: Secondary | ICD-10-CM | POA: Insufficient documentation

## 2011-11-10 DIAGNOSIS — R61 Generalized hyperhidrosis: Secondary | ICD-10-CM | POA: Insufficient documentation

## 2011-11-10 DIAGNOSIS — I428 Other cardiomyopathies: Secondary | ICD-10-CM | POA: Insufficient documentation

## 2011-11-10 MED ORDER — REGADENOSON 0.4 MG/5ML IV SOLN
0.4000 mg | Freq: Once | INTRAVENOUS | Status: AC
  Administered 2011-11-10: 0.4 mg via INTRAVENOUS

## 2011-11-10 MED ORDER — TECHNETIUM TC 99M SESTAMIBI GENERIC - CARDIOLITE
30.0000 | Freq: Once | INTRAVENOUS | Status: AC | PRN
  Administered 2011-11-10: 30 via INTRAVENOUS

## 2011-11-10 MED ORDER — TECHNETIUM TC 99M SESTAMIBI GENERIC - CARDIOLITE
10.0000 | Freq: Once | INTRAVENOUS | Status: AC | PRN
  Administered 2011-11-10: 10 via INTRAVENOUS

## 2011-11-10 NOTE — Progress Notes (Signed)
[facility] SITE 3 NUCLEAR MED [address] [id number] [place] [address] [phone]  Cardiology Nuclear Med Study  Pamela Kidd is a 66 y.o. female     MRN : [id number]     DOB: 1945-02-24  Procedure Date: 11/10/2011  Nuclear Med Background Indication for Stress Test:  Evaluation for Ischemia, Stent Patency and 11/04/11 Post Hospital: Chest Pain, (-) enzymes History:  2003: Oral CA with Chemo/Radiation, 08/26/11: [facility]-Heart Cath: Single vessel Dz-Stent-LAD EF: 30-35%, 08/27/11 ECHO: EF: 35-40%, Cardiomyopathy Cardiac Risk Factors: Lipids  Symptoms:  Chest Pain and Diaphoresis   Nuclear Pre-Procedure Caffeine/Decaff Intake:  None NPO After: 7:00pm   Lungs:  clear O2 Sat: 96-98% on room air. IV 0.9% NS with Angio Cath:  20g  IV Site: R Antecubital  IV Started by:  Stanton Kidney, EMT-P  Chest Size (in):  34 Cup Size: D  Height: 4\' 10"  (1.473 m)  Weight:  100 lb (45.36 kg)  BMI:  Body mass index is 20.90 kg/(m^2). Tech Comments:  Coreg held > 24 hours, per patient.    Nuclear Med Study 1 or 2 day study: 1 day  Stress Test Type:  Eugenie Birks  Reading MD: Cassell Clement, MD  Order Authorizing Provider:  P.Jordan MD  Resting Radionuclide: Technetium 32m Sestamibi  Resting Radionuclide Dose: 11.0 mCi   Stress Radionuclide:  Technetium 30m Sestamibi  Stress Radionuclide Dose: 33.0 mCi           Stress Protocol Rest HR: 65 Stress HR: 129  Rest BP: 125/81 Stress BP: 105/72  Exercise Time (min): n/a METS: n/a   Predicted Max HR: 154 bpm % Max HR: 83.77 bpm Rate Pressure Product: 25366   Dose of Adenosine (mg):  n/a Dose of Lexiscan: 0.4 mg  Dose of Atropine (mg): n/a Dose of Dobutamine: n/a mcg/kg/min (at max HR)  Stress Test Technologist: Milana Na, EMT-P  Nuclear Technologist:  Domenic Polite, CNMT     Rest Procedure:  Myocardial perfusion imaging was performed at rest 45 minutes following the intravenous administration of Technetium 59m  Sestamibi. Rest ECG: NSR with non-specific ST-T wave changes  Stress Procedure:  The patient received IV Lexiscan 0.4 mg over 15-seconds.  Technetium 42m Sestamibi injected at 30-seconds.  There were non specific changes, sob, leg cramps, abdominal pain, headache, ears ringing, and a rare pvc with Lexiscan.  Quantitative spect images were obtained after a 45 minute delay. Stress ECG: No significant change from baseline ECG  QPS Raw Data Images:  Normal; no motion artifact; normal heart/lung ratio. Stress Images:  Normal homogeneous uptake in all areas of the myocardium. Rest Images:  Normal homogeneous uptake in all areas of the myocardium. Subtraction (SDS):  No evidence of ischemia. Transient Ischemic Dilatation (Normal <1.22):  1.00 Lung/Heart Ratio (Normal <0.45):  0.33  Quantitative Gated Spect Images QGS EDV:  32 ml QGS ESV:  7 ml  Impression Exercise Capacity:  Lexiscan with no exercise. BP Response:  Normal blood pressure response. Clinical Symptoms:  There is dyspnea. ECG Impression:  No significant ST segment change suggestive of ischemia. Comparison with Prior Nuclear Study: No images to compare  Overall Impression:  Normal stress nuclear study.  LV Ejection Fraction: 80%.  LV Wall Motion:  NL LV Function; NL Wall Motion  [employer]

## 2011-12-01 ENCOUNTER — Other Ambulatory Visit: Payer: Self-pay

## 2011-12-01 ENCOUNTER — Ambulatory Visit (HOSPITAL_COMMUNITY): Payer: Medicare Other | Attending: Cardiology | Admitting: Radiology

## 2011-12-01 DIAGNOSIS — I25810 Atherosclerosis of coronary artery bypass graft(s) without angina pectoris: Secondary | ICD-10-CM

## 2011-12-01 DIAGNOSIS — I472 Ventricular tachycardia, unspecified: Secondary | ICD-10-CM | POA: Insufficient documentation

## 2011-12-01 DIAGNOSIS — I2109 ST elevation (STEMI) myocardial infarction involving other coronary artery of anterior wall: Secondary | ICD-10-CM

## 2011-12-01 DIAGNOSIS — I2510 Atherosclerotic heart disease of native coronary artery without angina pectoris: Secondary | ICD-10-CM

## 2011-12-01 DIAGNOSIS — I252 Old myocardial infarction: Secondary | ICD-10-CM | POA: Insufficient documentation

## 2011-12-01 DIAGNOSIS — E7800 Pure hypercholesterolemia, unspecified: Secondary | ICD-10-CM

## 2011-12-01 DIAGNOSIS — I255 Ischemic cardiomyopathy: Secondary | ICD-10-CM

## 2011-12-01 DIAGNOSIS — I4729 Other ventricular tachycardia: Secondary | ICD-10-CM | POA: Insufficient documentation

## 2011-12-01 NOTE — Progress Notes (Signed)
Echocardiogram performed.  

## 2011-12-03 ENCOUNTER — Telehealth: Payer: Self-pay | Admitting: Cardiology

## 2011-12-03 NOTE — Telephone Encounter (Signed)
Spoke to patient echo results given. 

## 2011-12-03 NOTE — Telephone Encounter (Signed)
Pt returning nurse call, she can be reached at [phone]

## 2011-12-08 ENCOUNTER — Ambulatory Visit (INDEPENDENT_AMBULATORY_CARE_PROVIDER_SITE_OTHER): Payer: Medicare Other | Admitting: Cardiology

## 2011-12-08 ENCOUNTER — Encounter: Payer: Self-pay | Admitting: Cardiology

## 2011-12-08 VITALS — BP 98/62 | HR 64 | Ht <= 58 in | Wt 102.0 lb

## 2011-12-08 DIAGNOSIS — I2589 Other forms of chronic ischemic heart disease: Secondary | ICD-10-CM

## 2011-12-08 DIAGNOSIS — E7800 Pure hypercholesterolemia, unspecified: Secondary | ICD-10-CM

## 2011-12-08 DIAGNOSIS — I255 Ischemic cardiomyopathy: Secondary | ICD-10-CM

## 2011-12-08 DIAGNOSIS — I2510 Atherosclerotic heart disease of native coronary artery without angina pectoris: Secondary | ICD-10-CM

## 2011-12-08 NOTE — Progress Notes (Signed)
Patient Name: Pamela Kidd Date of Encounter: 12/08/2011  Primary Care Provider:  Leo Grosser, MD Primary Cardiologist:  P. Sw[place]iland, MD  History of present illness:  66 y/o female with history of acute anterior myocardial infarction on 08/26/2011 treated with drug-eluting stent of the proximal LAD. She was admitted on October 16 with recurrent chest pain and ruled out for myocardial infarction. She had a LexiScan Myoview study on October 22. This was a normal study with ejection fraction of 80%. Echocardiogram confirmed normalization of her LV function. She has been without chest pain. She feels lightheaded when she bends over and does complain of some fatigue. Problem List   Past Medical History  Diagnosis Date  . CAD (coronary artery disease)     a. 08/26/2011 Acute Ant STEMI/Cath/PCI: LM nl, LAD 100p -> 2.75x23 Xience Xpedition, LCX 20, minor irregs, RCA 20-39mid, EF 30-35%, anterior/apical AK  . Ischemic cardiomyopathy     a. 08/27/2011 Echo: EF 35-40%, mid-dist anteroseptal/apical akinesis, Gr 1 DD, mild-mod TR, PASP  . Hyperlipidemia   . Hypotension     a. preventing use of ACEI  . Oral cancer 2003    a. s/p radiation/chemotherapy  . Diverticulosis    Past Surgical History  Procedure Date  . Foot surgery   . Left shoulder   . Abdominal hysterectomy     partial    Allergies  Allergies  Allergen Reactions  . Ampicillin Itching  . Codeine Itching and Nausea And Vomiting  . Nubain (Nalbuphine Hcl) Other (See Comments)    "headache & disoriented"        Home Medications  Prior to Admission medications   Medication Sig Start Date End Date Taking? Authorizing Provider  aspirin EC 81 MG EC tablet Take 1 tablet (81 mg total) by mouth daily. 09/01/11 08/31/12 Yes Brooke O Edmisten, PA-C  atorvastatin (LIPITOR) 40 MG tablet Take 80 mg by mouth daily. Four 20mg  tablets at night. 09/01/11 08/31/12 Yes Brooke O Edmisten, PA-C  carvedilol (COREG) 3.125 MG tablet  Take 1 tablet (3.125 mg total) by mouth 2 (two) times daily with a meal. 09/01/11 08/31/12 Yes Brooke O Edmisten, PA-C  nitroGLYCERIN (NITROSTAT) 0.4 MG SL tablet Place 1 tablet (0.4 mg total) under the tongue every 5 (five) minutes x 3 doses as needed for chest pain. 09/01/11 08/31/12 Yes Brooke O Edmisten, PA-C  OVER THE COUNTER MEDICATION Take 1 tablet by mouth 2 (two) times daily with a meal. Fiber supplement.   Yes Historical Provider, MD  OVER THE COUNTER MEDICATION Take 1 tablet by mouth 2 (two) times daily with a meal. Diverticulitis medication.   Yes Historical Provider, MD  pantoprazole (PROTONIX) 40 MG tablet Take 1 tablet (40 mg total) by mouth daily at 12 noon. 09/01/11 08/31/12 Yes Brooke O Edmisten, PA-C  Ticagrelor (BRILINTA) 90 MG TABS tablet Take 1 tablet (90 mg total) by mouth 2 (two) times daily. 09/01/11  Yes Brooke O Edmisten, PA-C    Review of Systems   Otherwise all other systems reviewed and are otherwise negative except as noted above.  Physical Exam  Blood pressure 98/62, pulse 64, height 4\' 10"  (1.473 m), weight 46.267 kg (102 lb).  General: Pleasant, NAD, thin Psych: Normal affect. Neuro: Alert and oriented X 3. Moves all extremities spontaneously. HEENT: Normal  Neck: Supple without bruits or JVD. Lungs:  Resp regular and unlabored, CTA. Heart: RRR no s3, s4, or murmurs. Abdomen: Soft, non-tender, non-distended, BS + x 4.  Extremities: No clubbing,  cyanosis or edema. DP/PT/Radials 2+ and equal bilaterally.  Accessory Clinical Findings Cardiology Nuclear Med Study  Pamela Kidd is a 66 y.o. female MRN : [id number] DOB: 1945/05/22  Procedure Date: 11/10/2011  Nuclear Med Background  Indication for Stress Test: Evaluation for Ischemia, Stent Patency and 11/04/11 Post Hospital: Chest Pain, (-) enzymes  History: 2003: Oral CA with Chemo/Radiation, 08/26/11: AWMI-Heart Cath: Single vessel Dz-Stent-LAD EF: 30-35%, 08/27/11 ECHO: EF: 35-40%, Cardiomyopathy  Cardiac Risk  Factors: Lipids  Symptoms: Chest Pain and Diaphoresis  Nuclear Pre-Procedure  Caffeine/Decaff Intake: None  NPO After: 7:00pm   Lungs: clear  O2 Sat: 96-98% on room air.  IV 0.9% NS with Angio Cath: 20g   IV Site: R Antecubital  IV Started by: Stanton Kidney, EMT-P   Chest Size (in): 34  Cup Size: D   Height: 4\' 10"  (1.473 m)  Weight: 100 lb (45.36 kg)   BMI: Body mass index is 20.90 kg/(m^2).  Tech Comments: Coreg held > 24 hours, per patient.   Nuclear Med Study  1 or 2 day study: 1 day  Stress Test Type: Eugenie Birks   Reading MD: Cassell Clement, MD  Order Authorizing Provider: P.Jordan MD   Resting Radionuclide: Technetium 62m Sestamibi  Resting Radionuclide Dose: 11.0 mCi   Stress Radionuclide: Technetium 38m Sestamibi  Stress Radionuclide Dose: 33.0 mCi   Stress Protocol  Rest HR: 65  Stress HR: 129   Rest BP: 125/81  Stress BP: 105/72   Exercise Time (min): n/a  METS: n/a   Predicted Max HR: 154 bpm  % Max HR: 83.77 bpm  Rate Pressure Product: 56213  Dose of Adenosine (mg): n/a  Dose of Lexiscan: 0.4 mg   Dose of Atropine (mg): n/a  Dose of Dobutamine: n/a mcg/kg/min (at max HR)   Stress Test Technologist: Milana Na, EMT-P  Nuclear Technologist: Domenic Polite, CNMT   Rest Procedure: Myocardial perfusion imaging was performed at rest 45 minutes following the intravenous administration of Technetium 53m Sestamibi.  Rest ECG: NSR with non-specific ST-T wave changes  Stress Procedure: The patient received IV Lexiscan 0.4 mg over 15-seconds. Technetium 56m Sestamibi injected at 30-seconds. There were non specific changes, sob, leg cramps, abdominal pain, headache, ears ringing, and a rare pvc with Lexiscan. Quantitative spect images were obtained after a 45 minute delay.  Stress ECG: No significant change from baseline ECG  QPS  Raw Data Images: Normal; no motion artifact; normal heart/lung ratio.  Stress Images: Normal homogeneous uptake in all areas of the myocardium.  Rest  Images: Normal homogeneous uptake in all areas of the myocardium.  Subtraction (SDS): No evidence of ischemia.  Transient Ischemic Dilatation (Normal <1.22): 1.00  Lung/Heart Ratio (Normal <0.45): 0.33  Quantitative Gated Spect Images  QGS EDV: 32 ml  QGS ESV: 7 ml  Impression  Exercise Capacity: Lexiscan with no exercise.  BP Response: Normal blood pressure response.  Clinical Symptoms: There is dyspnea.  ECG Impression: No significant ST segment change suggestive of ischemia.  Comparison with Prior Nuclear Study: No images to compare  Overall Impression: Normal stress nuclear study.  LV Ejection Fraction: 80%. LV Wall Motion: NL LV Function; NL Wall Motion  [facility]  Transthoracic Echocardiography  Patient: Pamela Kidd, Pamela Kidd MR #: [id number] Study Date: 12/01/2011 Gender: F Age: 49 Height: 147.3cm Weight: 46.3kg BSA: 1.36m^2 Pt. Status: Room:  ORDERING [place], Peter REFERRING [place], Peter SONOGRAPHER Luvenia Redden, RDCS ATTENDING Willa Rough PERFORMING [facility] cc:  ------------------------------------------------------------ LV EF: 60% - 65%  ------------------------------------------------------------  Indications: CAD of native vessels 414.01. Ventricular tachycardia 427.1",.  ------------------------------------------------------------ History: PMH: Acquired from the patient and from the patient's chart. Ventricular tachycardia. Coronary artery disease. Ischemic cardiomyopathy. PMH: Myocardial infarction.  ------------------------------------------------------------ Study Conclusions  - Left ventricle: The cavity size was normal. Wall thickness was normal. Systolic function was normal. The estimated ejection fraction was in the range of 60% to 65%. - Tricuspid valve: Moderate regurgitation. Transthoracic echocardiography. M-mode, complete 2D, spectral Doppler, and color Doppler. Height: Height: 147.3cm. Height: 58in. Weight: Weight:  46.3kg. Weight: 101.8lb. Body mass index: BMI: 21.3kg/m^2. Body surface area: BSA: 1.68m^2. Blood pressure: 106/73. Patient status: Outpatient. Location: [facility]  ------------------------------------------------------------  ------------------------------------------------------------ Left ventricle: The cavity size was normal. Wall thickness was normal. Systolic function was normal. The estimated ejection fraction was in the range of 60% to 65%.  ------------------------------------------------------------ Aortic valve: Structurally normal valve. Cusp separation was normal. Doppler: Transvalvular velocity was within the normal range. There was no stenosis. No regurgitation.  ------------------------------------------------------------ Aorta: Aortic root: The aortic root was normal in size. Ascending aorta: The ascending aorta was normal in size.  ------------------------------------------------------------ Mitral valve: Structurally normal valve. Leaflet separation was normal. Doppler: Transvalvular velocity was within the normal range. There was no evidence for stenosis. No regurgitation.  ------------------------------------------------------------ Left atrium: The atrium was normal in size.  ------------------------------------------------------------ Right ventricle: The cavity size was normal. Wall thickness was normal. Systolic function was normal.  ------------------------------------------------------------ Pulmonic valve: Doppler: No significant regurgitation.  ------------------------------------------------------------ Tricuspid valve: Doppler: Moderate regurgitation.  ------------------------------------------------------------ Pulmonary artery: Systolic pressure was within the normal range.  ------------------------------------------------------------ Right atrium: The atrium was normal in  size.  ------------------------------------------------------------ Pericardium: There was no pericardial effusion.  ------------------------------------------------------------ Systemic veins: Inferior vena cava: The vessel was normal in size; the respirophasic diameter changes were in the normal range (= 50%); findings are consistent with normal central venous pressure.  ------------------------------------------------------------  2D measurements Normal Doppler measurements Norma Left ventricle l LVID ED, 34.1 mm 43-52 Main pulmonary artery chord, Pressure, 21 mm Hg =30 PLAX S LVID ES, 23.6 mm 23-38 Left ventricle chord, Ea, lat 8.0 cm/s ----- PLAX ann, tiss 1 FS, chord, 31 % >29 DP PLAX E/Ea, lat 8.5 ----- LVPW, ED 8.23 mm ------ ann, tiss 6 IVS/LVPW 0.72 <1.3 DP ratio, ED Ea, med 5.8 cm/s ----- Ventricular septum ann, tiss 1 IVS, ED 5.96 mm ------ DP LVOT E/Ea, med 11. ----- Diam, S 19 mm ------ ann, tiss 81 Area 2.84 cm^2 ------ DP Diam 19 mm ------ LVOT Aorta Peak vel, 83. cm/s ----- Root diam, 30 mm ------ S 5 ED VTI, S 18. cm ----- Left atrium 2 AP dim 32 mm ------ HR 62 bpm ----- AP dim 2.34 cm/m^2 <2.2 Stroke vol 51. ml ----- index 6 Cardiac 3.2 L/min ----- output Cardiac 2.3 L/(min-m ----- index ^2) Stroke 37. ml/m^2 ----- index 7 Mitral valve Peak E vel 68. cm/s ----- 6 Peak A vel 73. cm/s ----- 5 Decelerati 197 ms 150-2 on time 30 Peak E/A 0.9 ----- ratio Tricuspid valve Regurg 201 cm/s ----- peak vel Peak RV-RA 16 mm Hg ----- gradient, S Systemic veins Estimated 5 mm Hg ----- CVP Right ventricle Pressure, 21 mm Hg <30 S Sa vel, 10. cm/s ----- lat ann, 9 tiss DP  ------------------------------------------------------------ Prepared and Electronically Authenticated by  Kristeen Miss 2013-11-12T15:29:31.800   Assessment & Plan  1.  CAD:  Pt is s/p PCI/DES of the LAD in the setting of acute MI in early August.  Need to  continue dual antiplatelet therapy for one year. Followup stress testing shows no evidence of scar or ischemia in both Myoview and echo showed normal LV function. Given her symptoms of lightheadedness and low blood pressure we will stop her carvedilol.  2.  ICM:  euvolemic on exam.  EF now normal.  3.  Hypotension:  We will reduce her medications by stopping her carvedilol.  4.  Hyperlipidemia. on Crestor 20 mg daily. We will repeat fasting lab work in 6 months.  5.  diarrhea: Resolved      Peter Swaziland, MD,FACC 12/08/2011, 1:13 PM

## 2011-12-08 NOTE — Patient Instructions (Addendum)
Stop taking carvedilol and Protonix  Stay on your other medications  I will see you in 6 months.

## 2011-12-29 ENCOUNTER — Emergency Department (HOSPITAL_COMMUNITY): Payer: Medicare Other

## 2011-12-29 ENCOUNTER — Encounter (HOSPITAL_COMMUNITY): Payer: Self-pay | Admitting: *Deleted

## 2011-12-29 ENCOUNTER — Telehealth: Payer: Self-pay | Admitting: Cardiology

## 2011-12-29 ENCOUNTER — Emergency Department (HOSPITAL_COMMUNITY)
Admission: EM | Admit: 2011-12-29 | Discharge: 2011-12-29 | Disposition: A | Discharge status: Home / Self Care | Payer: Medicare Other | Attending: Emergency Medicine | Admitting: Emergency Medicine

## 2011-12-29 DIAGNOSIS — R0989 Other specified symptoms and signs involving the circulatory and respiratory systems: Secondary | ICD-10-CM | POA: Insufficient documentation

## 2011-12-29 DIAGNOSIS — I2510 Atherosclerotic heart disease of native coronary artery without angina pectoris: Secondary | ICD-10-CM | POA: Insufficient documentation

## 2011-12-29 DIAGNOSIS — R5381 Other malaise: Secondary | ICD-10-CM | POA: Insufficient documentation

## 2011-12-29 DIAGNOSIS — Z7902 Long term (current) use of antithrombotics/antiplatelets: Secondary | ICD-10-CM | POA: Insufficient documentation

## 2011-12-29 DIAGNOSIS — Z85819 Personal history of malignant neoplasm of unspecified site of lip, oral cavity, and pharynx: Secondary | ICD-10-CM | POA: Insufficient documentation

## 2011-12-29 DIAGNOSIS — R42 Dizziness and giddiness: Secondary | ICD-10-CM | POA: Insufficient documentation

## 2011-12-29 DIAGNOSIS — Z8679 Personal history of other diseases of the circulatory system: Secondary | ICD-10-CM | POA: Insufficient documentation

## 2011-12-29 DIAGNOSIS — E785 Hyperlipidemia, unspecified: Secondary | ICD-10-CM | POA: Insufficient documentation

## 2011-12-29 DIAGNOSIS — R110 Nausea: Secondary | ICD-10-CM | POA: Insufficient documentation

## 2011-12-29 DIAGNOSIS — Z79899 Other long term (current) drug therapy: Secondary | ICD-10-CM | POA: Insufficient documentation

## 2011-12-29 DIAGNOSIS — Z9861 Coronary angioplasty status: Secondary | ICD-10-CM | POA: Insufficient documentation

## 2011-12-29 DIAGNOSIS — R531 Weakness: Secondary | ICD-10-CM

## 2011-12-29 DIAGNOSIS — Z7982 Long term (current) use of aspirin: Secondary | ICD-10-CM | POA: Insufficient documentation

## 2011-12-29 DIAGNOSIS — Z8719 Personal history of other diseases of the digestive system: Secondary | ICD-10-CM | POA: Insufficient documentation

## 2011-12-29 DIAGNOSIS — R0609 Other forms of dyspnea: Secondary | ICD-10-CM | POA: Insufficient documentation

## 2011-12-29 LAB — COMPREHENSIVE METABOLIC PANEL
AST: 19 U/L (ref 0–37)
Albumin: 4 g/dL (ref 3.5–5.2)
Alkaline Phosphatase: 87 U/L (ref 39–117)
BUN: 9 mg/dL (ref 6–23)
CO2: 26 mEq/L (ref 19–32)
Chloride: 102 mEq/L (ref 96–112)
Creatinine, Ser: 0.92 mg/dL (ref 0.50–1.10)
GFR calc non Af Amer: 63 mL/min — ABNORMAL LOW (ref >90)
Potassium: 3.4 mEq/L — ABNORMAL LOW (ref 3.5–5.1)
Total Bilirubin: 0.2 mg/dL — ABNORMAL LOW (ref 0.3–1.2)

## 2011-12-29 LAB — CBC WITH DIFFERENTIAL/PLATELET
Basophils Relative: 1 % (ref 0–1)
HCT: 41.9 % (ref 36.0–46.0)
Hemoglobin: 14.1 g/dL (ref 12.0–15.0)
Lymphocytes Relative: 18 % (ref 12–46)
MCHC: 33.7 g/dL (ref 30.0–36.0)
Monocytes Absolute: 0.5 10*3/uL (ref 0.1–1.0)
Monocytes Relative: 7 % (ref 3–12)
Neutro Abs: 5.1 10*3/uL (ref 1.7–7.7)
Neutrophils Relative %: 73 % (ref 43–77)
RBC: 4.81 MIL/uL (ref 3.87–5.11)
WBC: 7 10*3/uL (ref 4.0–10.5)

## 2011-12-29 LAB — LIPASE, BLOOD: Lipase: 40 U/L (ref 11–59)

## 2011-12-29 MED ORDER — SODIUM CHLORIDE 0.9 % IV SOLN
INTRAVENOUS | Status: DC
  Administered 2011-12-29: 20:00:00 via INTRAVENOUS

## 2011-12-29 NOTE — ED Notes (Signed)
Pt in c/o episodes of palpitations and shortness of breath, states she was sitting in the car and thought she was going to pass out. Denies chest pain, symptoms are intermittent, denies at this time. Dizziness upon standing.

## 2011-12-29 NOTE — Telephone Encounter (Signed)
New problem:    C/O heart racing .

## 2011-12-29 NOTE — Telephone Encounter (Signed)
Patient called stated she is presently at [facility] ER.States she was out shopping and bent over felt like she was going to pass out.States heart started beating fast and she became sob.Stated she has already been checked in by nurse and is waiting on ER Dr.

## 2011-12-29 NOTE — ED Provider Notes (Signed)
History     CSN: [id number]  Arrival date & time 12/29/11  1718   First MD Initiated Contact with Patient 12/29/11 1808      Chief Complaint  Patient presents with  . Palpitations    (Consider location/radiation/quality/duration/timing/severity/associated sxs/prior treatment) Patient is a 66 y.o. female presenting with palpitations. The history is provided by the patient.  Palpitations    patient here complaining of sudden onset of palpitations after she bent over. She been feeling normal prior to this. She's also been dyspneic. She appears near syncopal. She is dizzy when she stands up and walks. No vomiting or fever but some nausea. No prior history of same. Symptoms are better with ambulation  Past Medical History  Diagnosis Date  . CAD (coronary artery disease)     a. 08/26/2011 Acute Ant STEMI/Cath/PCI: LM nl, LAD 100p -> 2.75x23 Xience Xpedition, LCX 20, minor irregs, RCA 20-69mid, EF 30-35%, anterior/apical AK  . Ischemic cardiomyopathy     a. 08/27/2011 Echo: EF 35-40%, mid-dist anteroseptal/apical akinesis, Gr 1 DD, mild-mod TR, PASP  . Hyperlipidemia   . Hypotension     a. preventing use of ACEI  . Oral cancer 2003    a. s/p radiation/chemotherapy  . Diverticulosis     Past Surgical History  Procedure Date  . Foot surgery   . Left shoulder   . Abdominal hysterectomy     partial    History reviewed. No pertinent family history.  History  Substance Use Topics  . Smoking status: Never Smoker   . Smokeless tobacco: Never Used  . Alcohol Use: No    OB History    Grav Para Term Preterm Abortions TAB SAB Ect Mult Living                  Review of Systems  Cardiovascular: Positive for palpitations.  All other systems reviewed and are negative.    Allergies  Ampicillin; Codeine; and Nubain  Home Medications   Current Outpatient Rx  Name  Route  Sig  Dispense  Refill  . ASPIRIN 81 MG PO TBEC   Oral   Take 1 tablet (81 mg total) by mouth  daily.         Marland Kitchen DOCUSATE SODIUM 100 MG PO CAPS   Oral   Take 100 mg by mouth 2 (two) times daily.         Marland Kitchen NITROGLYCERIN 0.4 MG SL SUBL   Sublingual   Place 1 tablet (0.4 mg total) under the tongue every 5 (five) minutes x 3 doses as needed for chest pain.   30 tablet   3   . OVER THE COUNTER MEDICATION   Oral   Take 1 tablet by mouth 2 (two) times daily with a meal. Diverticulitis medication.         Marland Kitchen PANTOPRAZOLE SODIUM 20 MG PO TBEC   Oral   Take 1 tablet (20 mg total) by mouth daily.   30 tablet   3   . ROSUVASTATIN CALCIUM 20 MG PO TABS   Oral   Take 1 tablet (20 mg total) by mouth daily.   90 tablet   3   . TICAGRELOR 90 MG PO TABS   Oral   Take 1 tablet (90 mg total) by mouth 2 (two) times daily.   60 tablet   6     BP 136/73  Pulse 93  Temp 98.2 F (36.8 C) (Oral)  Resp 16  SpO2 100%  Physical Exam  Nursing note and vitals reviewed. Constitutional: She is oriented to person, place, and time. She appears well-developed and well-nourished.  Non-toxic appearance. No distress.  HENT:  Head: Normocephalic and atraumatic.  Eyes: Conjunctivae normal, EOM and lids are normal. Pupils are equal, round, and reactive to light.  Neck: Normal range of motion. Neck supple. No tracheal deviation present. No mass present.  Cardiovascular: Normal rate, regular rhythm and normal heart sounds.  Exam reveals no gallop.   No murmur heard. Pulmonary/Chest: Effort normal and breath sounds normal. No stridor. No respiratory distress. She has no decreased breath sounds. She has no wheezes. She has no rhonchi. She has no rales.  Abdominal: Soft. Normal appearance and bowel sounds are normal. She exhibits no distension. There is no tenderness. There is no rebound and no CVA tenderness.  Musculoskeletal: Normal range of motion. She exhibits no edema and no tenderness.  Neurological: She is alert and oriented to person, place, and time. She has normal strength. No cranial  nerve deficit or sensory deficit. GCS eye subscore is 4. GCS verbal subscore is 5. GCS motor subscore is 6.  Skin: Skin is warm and dry. No abrasion and no rash noted.  Psychiatric: She has a normal mood and affect. Her speech is normal and behavior is normal.    ED Course  Procedures (including critical care time)   Labs Reviewed  CBC WITH DIFFERENTIAL  COMPREHENSIVE METABOLIC PANEL  LIPASE, BLOOD  D-DIMER, QUANTITATIVE   No results found.   No diagnosis found.    MDM   Date: 12/29/2011  Rate: 83  Rhythm: normal sinus rhythm  QRS Axis: normal  Intervals: normal  ST/T Wave abnormalities: nonspecific ST changes  Conduction Disutrbances:none  Narrative Interpretation:   Old EKG Reviewed: none available   9:05 PM Patient's labs and x-rays reviewed. No concern for ACS or PE. Likely vagal stimulation as cause of her symptoms. Stable for discharge       Toy Baker, MD 12/29/11 2105

## 2011-12-30 ENCOUNTER — Telehealth: Payer: Self-pay | Admitting: Cardiology

## 2011-12-30 NOTE — Telephone Encounter (Signed)
Patient called stated she went to [facility] ER 12/29/11 with fast heart beat,B/P was elevated.Stated she was told to follow up with Dr.Jordan.States she is sob this morning.Appointment scheduled with Norma Fredrickson NP 12/31/11.

## 2011-12-31 ENCOUNTER — Encounter (INDEPENDENT_AMBULATORY_CARE_PROVIDER_SITE_OTHER): Payer: Medicare Other

## 2011-12-31 ENCOUNTER — Ambulatory Visit (INDEPENDENT_AMBULATORY_CARE_PROVIDER_SITE_OTHER): Payer: Medicare Other | Admitting: Nurse Practitioner

## 2011-12-31 ENCOUNTER — Encounter: Payer: Self-pay | Admitting: Nurse Practitioner

## 2011-12-31 VITALS — BP 110/68 | HR 68 | Ht <= 58 in | Wt 102.4 lb

## 2011-12-31 DIAGNOSIS — R002 Palpitations: Secondary | ICD-10-CM

## 2011-12-31 NOTE — Patient Instructions (Addendum)
Stay on your current medicines.   We are going to place an event monitor today  See Dr. Swaziland in 5 weeks  Call the [facility] office at [phone] if you have any questions, problems or concerns.

## 2011-12-31 NOTE — Progress Notes (Signed)
Pamela Kidd Date of Birth: 10-May-1945 Medical Record #[id number]  History of Present Illness: [name] is seen today for a post ER visit. She is seen for Dr. Swaziland. She has had a prior anterior MI in August treated with DES of the proximal LAD. Readmitted in October with chest pain. Negative for MI. Had LexiScan showing no ischemia and an EF of 80%. Echo confirmed normalization of her LV function. Her other problems include HLD, and GERD. She was not put on ACE due to hypotension.   She was in the [facility] ER 2 days ago. She presented with palpitations that occurred abruptly after bending over. She had been riding in the car and bent over to pick up pen that she had dropped. She got very short of breath. She was very dizzy when she stood up and walked.  Thought she was going to pass out. Had had some nausea as well. Her evaluation there was basically all negative. No concern for ACS or PE. She was felt to have had vagal stimulation.   She comes in today. She is here alone. She is doing ok. She says she still feels her heart race and feels a little breathless at times. No chest pain. She is anxious. She has not had frank syncope.   Current Outpatient Prescriptions on File Prior to Visit  Medication Sig Dispense Refill  . aspirin EC 81 MG EC tablet Take 1 tablet (81 mg total) by mouth daily.      Marland Kitchen docusate sodium (COLACE) 100 MG capsule Take 100 mg by mouth 2 (two) times daily.      . nitroGLYCERIN (NITROSTAT) 0.4 MG SL tablet Place 1 tablet (0.4 mg total) under the tongue every 5 (five) minutes x 3 doses as needed for chest pain.  30 tablet  3  . pantoprazole (PROTONIX) 20 MG tablet Take 1 tablet (20 mg total) by mouth daily.  30 tablet  3  . rosuvastatin (CRESTOR) 20 MG tablet Take 1 tablet (20 mg total) by mouth daily.  90 tablet  3  . Ticagrelor (BRILINTA) 90 MG TABS tablet Take 1 tablet (90 mg total) by mouth 2 (two) times daily.  60 tablet  6    Allergies  Allergen Reactions  .  Ampicillin Itching  . Codeine Itching and Nausea And Vomiting  . Nubain (Nalbuphine Hcl) Other (See Comments)    "headache & disoriented"      Past Medical History  Diagnosis Date  . CAD (coronary artery disease)     a. 08/26/2011 Acute Ant STEMI/Cath/PCI: LM nl, LAD 100p -> 2.75x23 Xience Xpedition, LCX 20, minor irregs, RCA 20-40mid, EF 30-35%, anterior/apical AK  . Ischemic cardiomyopathy     a. 08/27/2011 Echo: EF 35-40%, mid-dist anteroseptal/apical akinesis, Gr 1 DD, mild-mod TR, PASP  . Hyperlipidemia   . Hypotension     a. preventing use of ACEI  . Oral cancer 2003    a. s/p radiation/chemotherapy  . Diverticulosis     Past Surgical History  Procedure Date  . Foot surgery   . Left shoulder   . Abdominal hysterectomy     partial    History  Smoking status  . Never Smoker   Smokeless tobacco  . Never Used    History  Alcohol Use No    History reviewed. No pertinent family history.  Review of Systems: The review of systems is per the HPI.  All other systems were reviewed and are negative.  Physical Exam: BP  110/68  Pulse 68  Ht 4\' 10"  (1.473 m)  Wt 102 lb 6.4 oz (46.448 kg)  BMI 21.40 kg/m2 Patient is very pleasant and in no acute distress. Skin is warm and dry. Color is normal.  HEENT is unremarkable. Normocephalic/atraumatic. PERRL. Sclera are nonicteric. Neck is supple. No masses. No JVD. Lungs are clear. Cardiac exam shows a regular rate and rhythm. Abdomen is soft. Extremities are without edema. Gait and ROM are intact. No gross neurologic deficits noted.  LABORATORY DATA:  Lab Results  Component Value Date   WBC 7.0 12/29/2011   HGB 14.1 12/29/2011   HCT 41.9 12/29/2011   PLT 249 12/29/2011   GLUCOSE 119* 12/29/2011   CHOL 121 10/07/2011   TRIG 89.0 10/07/2011   HDL 49.80 10/07/2011   LDLCALC 53 10/07/2011   ALT 12 12/29/2011   AST 19 12/29/2011   NA 140 12/29/2011   K 3.4* 12/29/2011   CL 102 12/29/2011   CREATININE 0.92 12/29/2011    BUN 9 12/29/2011   CO2 26 12/29/2011   TSH 3.09 10/07/2011   INR 1.99* 08/26/2011   HGBA1C 5.8* 08/26/2011    Dg Chest 2 View  12/29/2011  CHEST - 2 VIEW  Comparison: 11/04/2011  Findings: Normal heart size, mediastinal contours, and pulmonary vascularity. Coronary arterial stents noted. Lungs clear. No pleural effusion or pneumothorax. Bones demineralized.  IMPRESSION: No acute abnormalities.   Original Report Authenticated By: Ulyses Southward, M.D.    Echo Study Conclusions from October 2013  - Left ventricle: The cavity size was normal. Wall thickness was normal. Systolic function was normal. The estimated ejection fraction was in the range of 60% to 65%. - Tricuspid valve: Moderate regurgitation.  Myoview Overall Impression from October 2013:  Normal stress nuclear study.  LV Ejection Fraction: 80%. LV Wall Motion: NL LV Function; NL Wall Motion   Pamela Kidd   Assessment / Plan:  1. Dizziness/Palpitations - will place an event monitor today. It is reassuring that her EF is normal.   2. CAD with prior DES of the LAD in the setting of acute MI in August. She has had follow up stresstesting showing no evidence of scar or ischemia. EF has improved as well. She is on DAPT for one year with her Brilinta. She does note that she sometimes misses her evening dose. We discussed ways to try and not miss. No longer on beta blocker due to hypotension.   3. HLD - on Crestor.  We will place an event monitor. See Dr. Swaziland in 5 weeks to discuss.   Patient is agreeable to this plan and will call if any problems develop in the interim.

## 2012-01-04 ENCOUNTER — Telehealth: Payer: Self-pay | Admitting: *Deleted

## 2012-01-04 ENCOUNTER — Telehealth: Payer: Self-pay | Admitting: Cardiology

## 2012-01-04 NOTE — Telephone Encounter (Signed)
New problem:   Has some questions regarding monitor. C/O skin tears.

## 2012-01-04 NOTE — Telephone Encounter (Signed)
DG placed event monitor on Pt on 12/31/11. I called and s/w pt,... Told her I requested sensitive electrodes to be mailed to her home. Mitch (e-cardio rep) contacted and aware.  TK

## 2012-01-04 NOTE — Telephone Encounter (Signed)
Will forward to Stanton Kidney for review.

## 2012-01-06 ENCOUNTER — Telehealth: Payer: Self-pay | Admitting: Cardiology

## 2012-01-06 NOTE — Telephone Encounter (Addendum)
Pt calling to see when she will get the sensitive electrodes in the mail, also has another question, pls call

## 2012-01-07 NOTE — Telephone Encounter (Signed)
Returned call, Pt rec'd sensitive electrodes yesterday afternoon. Other questions were answered. TK

## 2012-01-28 NOTE — Telephone Encounter (Signed)
Returned call to patient she stated she was told to mail back monitor on Sunday 01/31/12.Patient was told she can mail back on Monday 02/01/12.

## 2012-01-28 NOTE — Telephone Encounter (Signed)
New problem:    Clarification on which date she need to bring back her monitor on 1/12.

## 2012-02-11 ENCOUNTER — Ambulatory Visit (INDEPENDENT_AMBULATORY_CARE_PROVIDER_SITE_OTHER): Payer: Medicare Other | Admitting: Cardiology

## 2012-02-11 ENCOUNTER — Encounter: Payer: Self-pay | Admitting: Cardiology

## 2012-02-11 VITALS — BP 118/66 | HR 84 | Ht <= 58 in | Wt 101.8 lb

## 2012-02-11 DIAGNOSIS — E7800 Pure hypercholesterolemia, unspecified: Secondary | ICD-10-CM

## 2012-02-11 DIAGNOSIS — I2589 Other forms of chronic ischemic heart disease: Secondary | ICD-10-CM

## 2012-02-11 DIAGNOSIS — I2510 Atherosclerotic heart disease of native coronary artery without angina pectoris: Secondary | ICD-10-CM

## 2012-02-11 DIAGNOSIS — I255 Ischemic cardiomyopathy: Secondary | ICD-10-CM

## 2012-02-11 LAB — BASIC METABOLIC PANEL
BUN: 15 mg/dL (ref 6–23)
Calcium: 9.2 mg/dL (ref 8.4–10.5)
Creatinine, Ser: 1 mg/dL (ref 0.4–1.2)
GFR: 59.53 mL/min — ABNORMAL LOW (ref >60.00)
Glucose, Bld: 100 mg/dL — ABNORMAL HIGH (ref 70–99)

## 2012-02-11 LAB — LIPID PANEL
HDL: 60.5 mg/dL (ref >39.00)
LDL Cholesterol: 49 mg/dL (ref 0–99)
Total CHOL/HDL Ratio: 2
VLDL: 14.2 mg/dL (ref 0.0–40.0)

## 2012-02-11 LAB — CBC WITH DIFFERENTIAL/PLATELET
Basophils Relative: 0.6 % (ref 0.0–3.0)
Eosinophils Absolute: 0.1 10*3/uL (ref 0.0–0.7)
Eosinophils Relative: 1.2 % (ref 0.0–5.0)
HCT: 40.8 % (ref 36.0–46.0)
Lymphs Abs: 0.7 10*3/uL (ref 0.7–4.0)
MCHC: 33.8 g/dL (ref 30.0–36.0)
MCV: 87.8 fl (ref 78.0–100.0)
Monocytes Absolute: 0.6 10*3/uL (ref 0.1–1.0)
RBC: 4.64 Mil/uL (ref 3.87–5.11)
WBC: 8 10*3/uL (ref 4.5–10.5)

## 2012-02-11 LAB — HEPATIC FUNCTION PANEL: Albumin: 3.9 g/dL (ref 3.5–5.2)

## 2012-02-11 MED ORDER — CLOPIDOGREL BISULFATE 75 MG PO TABS: 75.0000 mg | ORAL_TABLET | Freq: Every day | ORAL | Status: DC

## 2012-02-11 NOTE — Progress Notes (Signed)
Pamela Kidd Date of Birth: February 17, 1945 Medical Record #[id number]  History of Present Illness: Pamela Kidd is seen today for followup. She has had a prior anterior MI in August treated with DES of the proximal LAD. Readmitted in October with chest pain. Negative for MI. Had LexiScan showing no ischemia and an EF of 80%. Echo confirmed normalization of her LV function. Her other problems include HLD, and GERD. She was not put on ACE due to hypotension. She was seen in emergency room M.D. 7 per with symptoms of palpitations and near syncope. She subsequently wore an event monitor which demonstrated no arrhythmias except for one PVC. She's had no recurrence of the symptoms. On followup today she does complain of some intermittent shortness of breath. She has had some hair loss.  Current Outpatient Prescriptions on File Prior to Visit  Medication Sig Dispense Refill  . aspirin EC 81 MG EC tablet Take 1 tablet (81 mg total) by mouth daily.      Marland Kitchen docusate sodium (COLACE) 100 MG capsule Take 100 mg by mouth 2 (two) times daily.      . Fiber CHEW Chew by mouth as needed.      . nitroGLYCERIN (NITROSTAT) 0.4 MG SL tablet Place 1 tablet (0.4 mg total) under the tongue every 5 (five) minutes x 3 doses as needed for chest pain.  30 tablet  3  . pantoprazole (PROTONIX) 20 MG tablet Take 1 tablet (20 mg total) by mouth daily.  30 tablet  3  . rosuvastatin (CRESTOR) 20 MG tablet Take 1 tablet (20 mg total) by mouth daily.  90 tablet  3    Allergies  Allergen Reactions  . Ampicillin Itching  . Codeine Itching and Nausea And Vomiting  . Nubain (Nalbuphine Hcl) Other (See Comments)    "headache & disoriented"      Past Medical History  Diagnosis Date  . CAD (coronary artery disease)     a. 08/26/2011 Acute Ant STEMI/Cath/PCI: LM nl, LAD 100p -> 2.75x23 Xience Xpedition, LCX 20, minor irregs, RCA 20-6mid, EF 30-35%, anterior/apical AK  . Ischemic cardiomyopathy     a. 08/27/2011 Echo: EF 35-40%, mid-dist  anteroseptal/apical akinesis, Gr 1 DD, mild-mod TR, PASP  . Hyperlipidemia   . Hypotension     a. preventing use of ACEI  . Oral cancer 2003    a. s/p radiation/chemotherapy  . Diverticulosis     Past Surgical History  Procedure Date  . Foot surgery   . Left shoulder   . Abdominal hysterectomy     partial    History  Smoking status  . Never Smoker   Smokeless tobacco  . Never Used    History  Alcohol Use No    History reviewed. No pertinent family history.  Review of Systems: The review of systems is per the HPI.  All other systems were reviewed and are negative.  Physical Exam: BP 118/66  Pulse 84  Ht 4\' 10"  (1.473 m)  Wt 101 lb 12.8 oz (46.176 kg)  BMI 21.28 kg/m2  SpO2 95% Patient is very pleasant and in no acute distress. Skin is warm and dry. Color is normal.  HEENT is unremarkable. Normocephalic/atraumatic. PERRL. Sclera are nonicteric. Neck is supple. No masses. No JVD. Lungs are clear. Cardiac exam shows a regular rate and rhythm. Abdomen is soft. Extremities are without edema. Gait and ROM are intact. No gross neurologic deficits noted.  LABORATORY DATA:   Assessment / Plan:  1. Dizziness/Palpitations -  resolved. Event monitor was unremarkable.  2. CAD with prior DES of the LAD in the setting of acute MI in August. She has had follow up stresstesting showing no evidence of scar or ischemia. EF has improved as well. She is on DAPT for one year. With her symptoms of dyspnea have recommended switching her from Brilinta to Plavix 75 mg daily.  3. HLD - on Crestor. We will check fasting lab work today including a CBC, chemistries, and lipid panel.

## 2012-02-11 NOTE — Patient Instructions (Signed)
Stop Brilinta  Start Plavix 75 mg daily instead.  We will check blood work today and call with the results.

## 2012-03-06 ENCOUNTER — Other Ambulatory Visit (HOSPITAL_COMMUNITY): Payer: Self-pay | Admitting: Physician Assistant

## 2012-04-11 ENCOUNTER — Telehealth: Payer: Self-pay | Admitting: Cardiology

## 2012-04-11 MED ORDER — PANTOPRAZOLE SODIUM 20 MG PO TBEC: 20.0000 mg | DELAYED_RELEASE_TABLET | Freq: Every day | ORAL | Status: DC

## 2012-04-11 NOTE — Telephone Encounter (Signed)
New problem   Pt has a question about how many refills she can get-ptt did not want to lm on refill line

## 2012-04-11 NOTE — Telephone Encounter (Signed)
Pt called to request a 90 days refill and 3 refills  on Protonix 20 mg once daily. Refill sent to [facility] as requested per pt. Pt aware.

## 2012-05-02 ENCOUNTER — Telehealth: Payer: Self-pay | Admitting: Cardiology

## 2012-05-02 NOTE — Telephone Encounter (Signed)
Returned call to patient no answer.LMTC. 

## 2012-05-02 NOTE — Telephone Encounter (Signed)
Patient called stated she had chest pressure that radiated into back this past Friday 04/29/12.Stated lasted 2 to 3 hours.Stated she took tums but did not take NTG.Stated she has not had any chest pressure since.Stated she has appointment with Dr.Jordan 05/31/12.Advised to call back if continues to have chest pressure.Advised to take NTG and go to ER if needed.Patient also asking would it be ok for her to travel to beach by herself.Advised not a good idea to travel alone.Advised to take someone with her.

## 2012-05-02 NOTE — Telephone Encounter (Signed)
New problem    Pt having an episode that she wants to discuss to see if she needs to come in

## 2012-05-05 ENCOUNTER — Telehealth: Payer: Self-pay | Admitting: Cardiology

## 2012-05-05 NOTE — Telephone Encounter (Deleted)
error 

## 2012-05-19 ENCOUNTER — Ambulatory Visit (INDEPENDENT_AMBULATORY_CARE_PROVIDER_SITE_OTHER): Payer: Medicare Other | Admitting: Cardiology

## 2012-05-19 ENCOUNTER — Telehealth: Payer: Self-pay

## 2012-05-19 ENCOUNTER — Encounter: Payer: Self-pay | Admitting: Cardiology

## 2012-05-19 VITALS — BP 116/70 | HR 76 | Ht <= 58 in | Wt 96.8 lb

## 2012-05-19 DIAGNOSIS — I255 Ischemic cardiomyopathy: Secondary | ICD-10-CM

## 2012-05-19 DIAGNOSIS — I2589 Other forms of chronic ischemic heart disease: Secondary | ICD-10-CM

## 2012-05-19 DIAGNOSIS — E7800 Pure hypercholesterolemia, unspecified: Secondary | ICD-10-CM

## 2012-05-19 DIAGNOSIS — R0789 Other chest pain: Secondary | ICD-10-CM

## 2012-05-19 DIAGNOSIS — I2510 Atherosclerotic heart disease of native coronary artery without angina pectoris: Secondary | ICD-10-CM

## 2012-05-19 DIAGNOSIS — R072 Precordial pain: Secondary | ICD-10-CM

## 2012-05-19 NOTE — Patient Instructions (Signed)
Continue your current therapy  Try and keep up with some exercise  I will see you in 6 months.

## 2012-05-19 NOTE — Telephone Encounter (Signed)
Spoke to patient she stated she forgot to ask Dr.Jordan today at office visit is it ok for her to go to the beach.Stated she will be going by herself.Message sent to Dr.Jordan.

## 2012-05-19 NOTE — Progress Notes (Signed)
Pamela Kidd Date of Birth: 1945-05-19 Medical Record #[id number]  History of Present Illness: Pamela Kidd is seen today for followup. She has had a prior anterior MI in August 2013 treated with DES of the proximal LAD. Readmitted in October with chest pain. Negative for MI. Had LexiScan showing no ischemia and an EF of 80%. Echo confirmed normalization of her LV function. Her other problems include HLD, and GERD. She was not put on ACE due to hypotension. She also had an event monitor which demonstrated rare PVCs. In general she has done well. About 2 weeks ago she had a slight pressure in her chest rating to her back. There was no true pain. She was just sitting on the sofa in her symptoms to several hours to resolve. No shortness of breath. She took TUMS with some relief. She reports that her appetite is poor. She has lost 5 pounds. She suffers from chronic tinnitus.  Current Outpatient Prescriptions on File Prior to Visit  Medication Sig Dispense Refill  . aspirin EC 81 MG EC tablet Take 1 tablet (81 mg total) by mouth daily.      . clopidogrel (PLAVIX) 75 MG tablet Take 1 tablet (75 mg total) by mouth daily.  90 tablet  3  . Fiber CHEW Chew by mouth as needed.      . nitroGLYCERIN (NITROSTAT) 0.4 MG SL tablet Place 1 tablet (0.4 mg total) under the tongue every 5 (five) minutes x 3 doses as needed for chest pain.  30 tablet  3  . rosuvastatin (CRESTOR) 20 MG tablet Take 1 tablet (20 mg total) by mouth daily.  90 tablet  3  . pantoprazole (PROTONIX) 20 MG tablet Take 1 tablet (20 mg total) by mouth daily.  90 tablet  3   No current facility-administered medications on file prior to visit.    Allergies  Allergen Reactions  . Ampicillin Itching  . Codeine Itching and Nausea And Vomiting  . Nubain (Nalbuphine Hcl) Other (See Comments)    "headache & disoriented"      Past Medical History  Diagnosis Date  . CAD (coronary artery disease)     a. 08/26/2011 Acute Ant STEMI/Cath/PCI: LM nl,  LAD 100p -> 2.75x23 Xience Xpedition, LCX 20, minor irregs, RCA 20-33mid, EF 30-35%, anterior/apical AK  . Ischemic cardiomyopathy     a. 08/27/2011 Echo: EF 35-40%, mid-dist anteroseptal/apical akinesis, Gr 1 DD, mild-mod TR, PASP  . Hyperlipidemia   . Hypotension     a. preventing use of ACEI  . Oral cancer 2003    a. s/p radiation/chemotherapy  . Diverticulosis     Past Surgical History  Procedure Laterality Date  . Foot surgery    . Left shoulder    . Abdominal hysterectomy      partial    History  Smoking status  . Never Smoker   Smokeless tobacco  . Never Used    History  Alcohol Use No    History reviewed. No pertinent family history.  Review of Systems: The review of systems is per the HPI.  All other systems were reviewed and are negative.  Physical Exam: BP 116/70  Pulse 76  Ht 4\' 10"  (1.473 m)  Wt 96 lb 12 oz (43.886 kg)  BMI 20.23 kg/m2 Patient is very pleasant and in no acute distress. Skin is warm and dry. Color is normal.  HEENT is unremarkable. Normocephalic/atraumatic. PERRL. Sclera are nonicteric. Neck is supple. No masses. No JVD. Lungs are clear.  Cardiac exam shows a regular rate and rhythm. Abdomen is soft. Extremities are without edema. Gait and ROM are intact. No gross neurologic deficits noted.  LABORATORY DATA:   Assessment / Plan:  1.  CAD with prior DES of the LAD in the setting of acute MI in August. She has had follow up stress testing showing no evidence of scar or ischemia. EF has normalized. She is on DAPT for one year. Her recent symptoms of chest pressure sounds fairly atypical. We will monitor for now given her extensive evaluation this past fall.  2. HLD - on Crestor.

## 2012-05-19 NOTE — Telephone Encounter (Signed)
Spoke with patient Dr.Jordan advised ok to go to beach.Patient stated she also forgot to mention her right lower leg and foot swelling for the last two weeks.Stated she does not eat salt.Message sent to Dr.Jordan.

## 2012-05-20 ENCOUNTER — Ambulatory Visit
Admission: RE | Admit: 2012-05-20 | Discharge: 2012-05-20 | Disposition: A | Discharge status: Home / Self Care | Payer: Medicare Other | Source: Ambulatory Visit | Attending: Family Medicine | Admitting: Family Medicine

## 2012-05-20 ENCOUNTER — Encounter: Payer: Self-pay | Admitting: Family Medicine

## 2012-05-20 ENCOUNTER — Ambulatory Visit (INDEPENDENT_AMBULATORY_CARE_PROVIDER_SITE_OTHER): Payer: Medicare Other | Admitting: Family Medicine

## 2012-05-20 VITALS — BP 110/68 | HR 80 | Temp 98.5°F | Resp 16 | Ht <= 58 in | Wt 98.0 lb

## 2012-05-20 DIAGNOSIS — C069 Malignant neoplasm of mouth, unspecified: Secondary | ICD-10-CM | POA: Insufficient documentation

## 2012-05-20 DIAGNOSIS — R0600 Dyspnea, unspecified: Secondary | ICD-10-CM

## 2012-05-20 DIAGNOSIS — Z1239 Encounter for other screening for malignant neoplasm of breast: Secondary | ICD-10-CM

## 2012-05-20 DIAGNOSIS — E785 Hyperlipidemia, unspecified: Secondary | ICD-10-CM | POA: Insufficient documentation

## 2012-05-20 DIAGNOSIS — Z1231 Encounter for screening mammogram for malignant neoplasm of breast: Secondary | ICD-10-CM

## 2012-05-20 DIAGNOSIS — Z0000 Encounter for general adult medical examination without abnormal findings: Secondary | ICD-10-CM

## 2012-05-20 DIAGNOSIS — R0989 Other specified symptoms and signs involving the circulatory and respiratory systems: Secondary | ICD-10-CM

## 2012-05-20 LAB — BASIC METABOLIC PANEL
Calcium: 10.2 mg/dL (ref 8.4–10.5)
Sodium: 142 mEq/L (ref 135–145)

## 2012-05-20 LAB — CBC WITH DIFFERENTIAL/PLATELET
Basophils Absolute: 0 10*3/uL (ref 0.0–0.1)
HCT: 41.3 % (ref 36.0–46.0)
Hemoglobin: 13.7 g/dL (ref 12.0–15.0)
Lymphocytes Relative: 10 % — ABNORMAL LOW (ref 12–46)
Monocytes Absolute: 0.7 10*3/uL (ref 0.1–1.0)
Monocytes Relative: 7 % (ref 3–12)
Neutro Abs: 8.1 10*3/uL — ABNORMAL HIGH (ref 1.7–7.7)
Neutrophils Relative %: 82 % — ABNORMAL HIGH (ref 43–77)
WBC: 9.9 10*3/uL (ref 4.0–10.5)

## 2012-05-20 LAB — HEPATIC FUNCTION PANEL
ALT: 13 U/L (ref 0–35)
Bilirubin, Direct: 0.1 mg/dL (ref 0.0–0.3)
Indirect Bilirubin: 0.4 mg/dL (ref 0.0–0.9)
Total Bilirubin: 0.5 mg/dL (ref 0.3–1.2)

## 2012-05-20 LAB — LIPID PANEL
LDL Cholesterol: 52 mg/dL (ref 0–99)
Total CHOL/HDL Ratio: 2 Ratio
Triglycerides: 79 mg/dL (ref <150)
VLDL: 16 mg/dL (ref 0–40)

## 2012-05-20 LAB — TSH: TSH: 5.946 u[IU]/mL — ABNORMAL HIGH (ref 0.350–4.500)

## 2012-05-20 NOTE — Progress Notes (Signed)
Subjective:    Patient ID: Pamela Kidd, female    DOB: 1945/08/04, 67 y.o.   MRN: [id number]  HPI Patient is here today for her complete physical exam. I did review the office note from Pamela Kidd. The patient has a history of coronary artery disease and suffered STEMI of the anterior wall in August of 2013.  She underwent coronary artery stenting. She's currently taking Plavix. Repeat stress test in the fall revealed no evidence of ischemia.  The patient reports dyspnea on exertion. She also reports easy fatigability. Dr. Swaziland I feel this is cardiac in origin based on her normal stress test. That is her primary concern today in addition to her physical. She is due for her mammogram. Her colonoscopy is up-to-date. She had history of partial hysterectomy and therefore does no longer require Pap smear. Past Medical History  Diagnosis Date  . CAD (coronary artery disease)     a. 08/26/2011 Acute Ant STEMI/Cath/PCI: LM nl, LAD 100p -> 2.75x23 Xience Xpedition, LCX 20, minor irregs, RCA 20-57mid, EF 30-35%, anterior/apical AK  . Ischemic cardiomyopathy     a. 08/27/2011 Echo: EF 35-40%, mid-dist anteroseptal/apical akinesis, Gr 1 DD, mild-mod TR, PASP  . Hypotension     a. preventing use of ACEI  . Diverticulosis   . Oral cancer 2003    a. s/p radiation/chemotherapy  . Hyperlipidemia    Current Outpatient Prescriptions on File Prior to Visit  Medication Sig Dispense Refill  . aspirin EC 81 MG EC tablet Take 1 tablet (81 mg total) by mouth daily.      . Cholecalciferol (VITAMIN D) 2000 UNITS CAPS Take by mouth every morning.      . clopidogrel (PLAVIX) 75 MG tablet Take 1 tablet (75 mg total) by mouth daily.  90 tablet  3  . nitroGLYCERIN (NITROSTAT) 0.4 MG SL tablet Place 1 tablet (0.4 mg total) under the tongue every 5 (five) minutes x 3 doses as needed for chest pain.  30 tablet  3  . pantoprazole (PROTONIX) 20 MG tablet Take 1 tablet (20 mg total) by mouth daily.  90 tablet  3  .  polyethylene glycol (MIRALAX / GLYCOLAX) packet Take 17 g by mouth as needed.      . rosuvastatin (CRESTOR) 20 MG tablet Take 1 tablet (20 mg total) by mouth daily.  90 tablet  3   No current facility-administered medications on file prior to visit.   Allergies  Allergen Reactions  . Ampicillin Itching  . Codeine Itching and Nausea And Vomiting  . Nubain (Nalbuphine Hcl) Other (See Comments)    "headache & disoriented"     History   Social History  . Marital Status: Married    Spouse Name: N/A    Number of Children: N/A  . Years of Education: N/A   Occupational History  . Not on file.   Social History Main Topics  . Smoking status: Never Smoker   . Smokeless tobacco: Never Used  . Alcohol Use: No  . Drug Use: No  . Sexually Active: Yes   Other Topics Concern  . Not on file   Social History Narrative  . No narrative on file   Family History  Problem Relation Age of Onset  . Dementia Mother   . Heart disease Father   . Cancer Father     prostate  . Heart disease Paternal Grandfather       Review of Systems  All other systems reviewed and are  negative.       Objective:   Physical Exam  Constitutional: She is oriented to person, place, and time. She appears well-developed and well-nourished. No distress.  HENT:  Head: Normocephalic.  Right Ear: External ear normal.  Left Ear: External ear normal.  Nose: Nose normal.  Mouth/Throat: No oropharyngeal exudate.  Eyes: Conjunctivae and EOM are normal. Pupils are equal, round, and reactive to light. No scleral icterus.  Neck: Normal range of motion. Neck supple. No JVD present. No tracheal deviation present. No thyromegaly present.  Cardiovascular: Normal rate, regular rhythm, normal heart sounds and intact distal pulses.  Exam reveals no gallop and no friction rub.   No murmur heard. Pulmonary/Chest: Effort normal and breath sounds normal. No respiratory distress. She has no wheezes. She has no rales. She  exhibits no tenderness.  Abdominal: Soft. Bowel sounds are normal. She exhibits no distension and no mass. There is no tenderness. There is no rebound and no guarding.  Musculoskeletal: Normal range of motion. She exhibits no edema and no tenderness.  Lymphadenopathy:    She has no cervical adenopathy.  Neurological: She is alert and oriented to person, place, and time. She has normal reflexes. She displays normal reflexes. No cranial nerve deficit. She exhibits normal muscle tone. Coordination normal.  Skin: Skin is warm and dry. No rash noted. She is not diaphoretic. No erythema. No pallor.  Psychiatric: She has a normal mood and affect. Her behavior is normal. Judgment and thought content normal.   she has normal breast exam with no axillary lymphadenopathy        Assessment & Plan:  1. Routine general medical examination at a health care facility I will obtain screening labs.  She had a pneumonia vaccine in 2013.   - Basic Metabolic Panel - CBC with Differential - Hepatic Function Panel - Lipid Panel - TSH  2. Dyspnea Given her history of head and neck cancer, obtain a chest x-ray to evaluate for pulmonary pathology. I will also check a CBC to rule out anemia. This could be deconditioning due to to her relative inactivity [name] coupled with her recent heart attack. Her lab values and chest x-ray are normal I recommend general increase aerobic exercise. - DG Chest 2 View; Future  3. Breast cancer screening Schedule mammogram. - MM Digital Screening; Future

## 2012-05-31 ENCOUNTER — Ambulatory Visit: Payer: Medicare Other | Admitting: Cardiology

## 2012-06-08 ENCOUNTER — Encounter: Payer: Self-pay | Admitting: Nurse Practitioner

## 2012-06-08 ENCOUNTER — Telehealth: Payer: Self-pay | Admitting: Cardiology

## 2012-06-08 ENCOUNTER — Ambulatory Visit (INDEPENDENT_AMBULATORY_CARE_PROVIDER_SITE_OTHER): Payer: Medicare Other | Admitting: Nurse Practitioner

## 2012-06-08 ENCOUNTER — Observation Stay (HOSPITAL_COMMUNITY)
Admission: AD | Admit: 2012-06-08 | Discharge: 2012-06-10 | Disposition: A | Discharge status: Home / Self Care | Payer: Medicare Other | Source: Ambulatory Visit | Attending: Cardiology | Admitting: Cardiology

## 2012-06-08 ENCOUNTER — Encounter (HOSPITAL_COMMUNITY): Payer: Self-pay | Admitting: General Practice

## 2012-06-08 VITALS — BP 99/60 | HR 69 | Ht <= 58 in | Wt 94.8 lb

## 2012-06-08 DIAGNOSIS — Z23 Encounter for immunization: Secondary | ICD-10-CM | POA: Insufficient documentation

## 2012-06-08 DIAGNOSIS — E785 Hyperlipidemia, unspecified: Secondary | ICD-10-CM | POA: Insufficient documentation

## 2012-06-08 DIAGNOSIS — I959 Hypotension, unspecified: Secondary | ICD-10-CM | POA: Insufficient documentation

## 2012-06-08 DIAGNOSIS — R0789 Other chest pain: Secondary | ICD-10-CM

## 2012-06-08 DIAGNOSIS — Z79899 Other long term (current) drug therapy: Secondary | ICD-10-CM | POA: Insufficient documentation

## 2012-06-08 DIAGNOSIS — R0602 Shortness of breath: Secondary | ICD-10-CM | POA: Insufficient documentation

## 2012-06-08 DIAGNOSIS — I2510 Atherosclerotic heart disease of native coronary artery without angina pectoris: Secondary | ICD-10-CM | POA: Insufficient documentation

## 2012-06-08 DIAGNOSIS — R072 Precordial pain: Secondary | ICD-10-CM

## 2012-06-08 DIAGNOSIS — R079 Chest pain, unspecified: Secondary | ICD-10-CM

## 2012-06-08 DIAGNOSIS — E7800 Pure hypercholesterolemia, unspecified: Secondary | ICD-10-CM | POA: Insufficient documentation

## 2012-06-08 DIAGNOSIS — Z9861 Coronary angioplasty status: Secondary | ICD-10-CM | POA: Insufficient documentation

## 2012-06-08 HISTORY — DX: Anemia, unspecified: D64.9

## 2012-06-08 HISTORY — DX: ST elevation (STEMI) myocardial infarction of unspecified site: I21.3

## 2012-06-08 HISTORY — DX: Unspecified asthma, uncomplicated: J45.909

## 2012-06-08 HISTORY — DX: Personal history of other diseases of the digestive system: Z87.19

## 2012-06-08 LAB — COMPREHENSIVE METABOLIC PANEL
ALT: 10 U/L (ref 0–35)
AST: 18 U/L (ref 0–37)
Albumin: 3.5 g/dL (ref 3.5–5.2)
Alkaline Phosphatase: 62 U/L (ref 39–117)
BUN: 12 mg/dL (ref 6–23)
CO2: 26 mEq/L (ref 19–32)
Calcium: 9.5 mg/dL (ref 8.4–10.5)
Chloride: 105 mEq/L (ref 96–112)
Creatinine, Ser: 1.19 mg/dL — ABNORMAL HIGH (ref 0.50–1.10)
GFR calc Af Amer: 54 mL/min — ABNORMAL LOW (ref >90)
GFR calc non Af Amer: 47 mL/min — ABNORMAL LOW (ref >90)
Glucose, Bld: 99 mg/dL (ref 70–99)
Potassium: 4.3 mEq/L (ref 3.5–5.1)
Sodium: 142 mEq/L (ref 135–145)
Total Bilirubin: 0.4 mg/dL (ref 0.3–1.2)
Total Protein: 6.5 g/dL (ref 6.0–8.3)

## 2012-06-08 LAB — CBC WITH DIFFERENTIAL/PLATELET
Basophils Absolute: 0.1 10*3/uL (ref 0.0–0.1)
Basophils Relative: 1 % (ref 0–1)
Eosinophils Absolute: 0.1 10*3/uL (ref 0.0–0.7)
Eosinophils Relative: 2 % (ref 0–5)
HCT: 39.3 % (ref 36.0–46.0)
Hemoglobin: 13 g/dL (ref 12.0–15.0)
Lymphocytes Relative: 14 % (ref 12–46)
Lymphs Abs: 1 10*3/uL (ref 0.7–4.0)
MCH: 29.3 pg (ref 26.0–34.0)
MCHC: 33.1 g/dL (ref 30.0–36.0)
MCV: 88.7 fL (ref 78.0–100.0)
Monocytes Absolute: 0.6 10*3/uL (ref 0.1–1.0)
Monocytes Relative: 8 % (ref 3–12)
Neutro Abs: 5.3 10*3/uL (ref 1.7–7.7)
Neutrophils Relative %: 75 % (ref 43–77)
Platelets: 239 10*3/uL (ref 150–400)
RBC: 4.43 MIL/uL (ref 3.87–5.11)
RDW: 13.6 % (ref 11.5–15.5)
WBC: 7.1 10*3/uL (ref 4.0–10.5)

## 2012-06-08 LAB — PROTIME-INR
INR: 1.03 (ref 0.00–1.49)
Prothrombin Time: 13.4 seconds (ref 11.6–15.2)

## 2012-06-08 LAB — TSH: TSH: 3.279 u[IU]/mL (ref 0.350–4.500)

## 2012-06-08 LAB — APTT: aPTT: 27 seconds (ref 24–37)

## 2012-06-08 LAB — MAGNESIUM: Magnesium: 2.4 mg/dL (ref 1.5–2.5)

## 2012-06-08 LAB — TROPONIN I
Troponin I: <0.3 ng/mL (ref <0.30)
Troponin I: <0.3 ng/mL (ref <0.30)

## 2012-06-08 MED ORDER — SODIUM CHLORIDE 0.9 % IJ SOLN: 3.0000 mL | INTRAMUSCULAR | Status: DC | PRN

## 2012-06-08 MED ORDER — SODIUM CHLORIDE 0.9 % IV SOLN: 250.0000 mL | INTRAVENOUS | Status: DC | PRN

## 2012-06-08 MED ORDER — SODIUM CHLORIDE 0.9 % IJ SOLN
3.0000 mL | Freq: Two times a day (BID) | INTRAMUSCULAR | Status: DC
  Administered 2012-06-08: 3 mL via INTRAVENOUS

## 2012-06-08 MED ORDER — ENOXAPARIN SODIUM 40 MG/0.4ML ~~LOC~~ SOLN
40.0000 mg | SUBCUTANEOUS | Status: AC
  Administered 2012-06-08: 40 mg via SUBCUTANEOUS
  Filled 2012-06-08: qty 0.4

## 2012-06-08 MED ORDER — ASPIRIN EC 81 MG PO TBEC: 81.0000 mg | DELAYED_RELEASE_TABLET | Freq: Every day | ORAL | Status: DC

## 2012-06-08 MED ORDER — DIAZEPAM 5 MG PO TABS
5.0000 mg | ORAL_TABLET | ORAL | Status: AC
  Administered 2012-06-09: 5 mg via ORAL
  Filled 2012-06-08: qty 1

## 2012-06-08 MED ORDER — NITROGLYCERIN 0.4 MG SL SUBL: 0.4000 mg | SUBLINGUAL_TABLET | SUBLINGUAL | Status: DC | PRN

## 2012-06-08 MED ORDER — ASPIRIN 300 MG RE SUPP
300.0000 mg | RECTAL | Status: AC
  Filled 2012-06-08: qty 1

## 2012-06-08 MED ORDER — ROSUVASTATIN CALCIUM 20 MG PO TABS
20.0000 mg | ORAL_TABLET | Freq: Every day | ORAL | Status: DC
  Administered 2012-06-08 – 2012-06-09 (×2): 20 mg via ORAL
  Filled 2012-06-08 (×3): qty 1

## 2012-06-08 MED ORDER — ATORVASTATIN CALCIUM 80 MG PO TABS
80.0000 mg | ORAL_TABLET | Freq: Every day | ORAL | Status: DC
  Filled 2012-06-08: qty 1

## 2012-06-08 MED ORDER — ROSUVASTATIN CALCIUM 10 MG PO TABS
20.0000 mg | ORAL_TABLET | Freq: Every day | ORAL | Status: DC
  Filled 2012-06-08: qty 2

## 2012-06-08 MED ORDER — ENOXAPARIN SODIUM 40 MG/0.4ML ~~LOC~~ SOLN
40.0000 mg | Freq: Two times a day (BID) | SUBCUTANEOUS | Status: DC
  Filled 2012-06-08 (×3): qty 0.4

## 2012-06-08 MED ORDER — ACETAMINOPHEN 325 MG PO TABS: 650.0000 mg | ORAL_TABLET | ORAL | Status: DC | PRN

## 2012-06-08 MED ORDER — VITAMIN D3 25 MCG (1000 UNIT) PO TABS
2000.0000 [IU] | ORAL_TABLET | Freq: Every day | ORAL | Status: DC
  Administered 2012-06-08 – 2012-06-09 (×2): 2000 [IU] via ORAL
  Filled 2012-06-08 (×2): qty 2

## 2012-06-08 MED ORDER — POLYETHYLENE GLYCOL 3350 17 G PO PACK
17.0000 g | PACK | Freq: Every day | ORAL | Status: DC | PRN
  Filled 2012-06-08: qty 1

## 2012-06-08 MED ORDER — SODIUM CHLORIDE 0.9 % IV SOLN
1.0000 mL/kg/h | INTRAVENOUS | Status: DC
  Administered 2012-06-09: 1 mL/kg/h via INTRAVENOUS

## 2012-06-08 MED ORDER — PNEUMOCOCCAL VAC POLYVALENT 25 MCG/0.5ML IJ INJ
0.5000 mL | INJECTION | INTRAMUSCULAR | Status: AC
  Administered 2012-06-10: 0.5 mL via INTRAMUSCULAR
  Filled 2012-06-08: qty 0.5

## 2012-06-08 MED ORDER — ASPIRIN 81 MG PO CHEW
324.0000 mg | CHEWABLE_TABLET | ORAL | Status: AC
  Administered 2012-06-09: 324 mg via ORAL
  Filled 2012-06-08: qty 4

## 2012-06-08 MED ORDER — ONDANSETRON HCL 4 MG/2ML IJ SOLN: 4.0000 mg | Freq: Four times a day (QID) | INTRAMUSCULAR | Status: DC | PRN

## 2012-06-08 MED ORDER — CLOPIDOGREL BISULFATE 75 MG PO TABS
75.0000 mg | ORAL_TABLET | ORAL | Status: AC
  Administered 2012-06-09: 75 mg via ORAL
  Filled 2012-06-08: qty 1

## 2012-06-08 MED ORDER — ASPIRIN 81 MG PO CHEW
324.0000 mg | CHEWABLE_TABLET | ORAL | Status: AC
  Administered 2012-06-08: 324 mg via ORAL
  Filled 2012-06-08: qty 4

## 2012-06-08 MED ORDER — PANTOPRAZOLE SODIUM 20 MG PO TBEC
20.0000 mg | DELAYED_RELEASE_TABLET | Freq: Two times a day (BID) | ORAL | Status: DC
  Administered 2012-06-08 – 2012-06-09 (×2): 20 mg via ORAL
  Filled 2012-06-08 (×4): qty 1

## 2012-06-08 MED ORDER — METOPROLOL TARTRATE 12.5 MG HALF TABLET
12.5000 mg | ORAL_TABLET | Freq: Two times a day (BID) | ORAL | Status: DC
  Administered 2012-06-08: 12.5 mg via ORAL
  Filled 2012-06-08 (×4): qty 1

## 2012-06-08 NOTE — Telephone Encounter (Signed)
New problem    Pt stated something happen last night with her heart and she need to talk to you concerning this. Please call pt.

## 2012-06-08 NOTE — Progress Notes (Signed)
Pamela Kidd Date of Birth: 12-31-1945 Medical Record #[id number]  History of Present Illness: Pamela Kidd is seen back today for a work in visit. Seen for Dr. Swaziland. Has known CAD with anterior MI in August of 2013 treated with DES of the proximal LAD. EF has recovered. Negative Myoview in October of 2013. Other issues include HLD and GERD. Has rate PVCs on past event monitor. Chronically poor eater due to mouth issues. Is underweight.   Just seen earlier this month and felt to be doing ok.  Comes in today. She is here with her husband. She returned from the beach last night. Went to bed. Woke up with midsternal chest pain. This was fleeting but recurrent. Called to be seen today. While in the waiting room and in the exam room - has chest tightness with shortness of breath. Does not use NTG due to headache. Very anxious. She remains on chronic PPI therapy and is on Plavix.   Current Outpatient Prescriptions on File Prior to Visit  Medication Sig Dispense Refill  . aspirin EC 81 MG EC tablet Take 1 tablet (81 mg total) by mouth daily.      . Cholecalciferol (VITAMIN D) 2000 UNITS CAPS Take by mouth every morning.      . clopidogrel (PLAVIX) 75 MG tablet Take 1 tablet (75 mg total) by mouth daily.  90 tablet  3  . nitroGLYCERIN (NITROSTAT) 0.4 MG SL tablet Place 1 tablet (0.4 mg total) under the tongue every 5 (five) minutes x 3 doses as needed for chest pain.  30 tablet  3  . pantoprazole (PROTONIX) 20 MG tablet Take 1 tablet (20 mg total) by mouth daily.  90 tablet  3  . polyethylene glycol (MIRALAX / GLYCOLAX) packet Take 17 g by mouth as needed.      . rosuvastatin (CRESTOR) 20 MG tablet Take 1 tablet (20 mg total) by mouth daily.  90 tablet  3   No current facility-administered medications on file prior to visit.    Allergies  Allergen Reactions  . Ampicillin Itching  . Codeine Itching and Nausea And Vomiting  . Nubain (Nalbuphine Hcl) Other (See Comments)    "headache & disoriented"        Past Medical History  Diagnosis Date  . CAD (coronary artery disease)     a. 08/26/2011 Acute Ant STEMI/Cath/PCI: LM nl, LAD 100p -> 2.75x23 Xience Xpedition, LCX 20, minor irregs, RCA 20-75mid, EF 30-35%, anterior/apical AK  . Ischemic cardiomyopathy     a. 08/27/2011 Echo: EF 35-40%, mid-dist anteroseptal/apical akinesis, Gr 1 DD, mild-mod TR, PASP  . Hypotension     a. preventing use of ACEI  . Diverticulosis   . Oral cancer 2003    a. s/p radiation/chemotherapy  . Hyperlipidemia     Past Surgical History  Procedure Laterality Date  . Foot surgery    . Left shoulder    . Abdominal hysterectomy      partial    History  Smoking status  . Never Smoker   Smokeless tobacco  . Never Used    History  Alcohol Use No    Family History  Problem Relation Age of Onset  . Dementia Mother   . Heart disease Father   . Cancer Father     prostate  . Heart disease Paternal Grandfather     Review of Systems: The review of systems is per the HPI.  All other systems were reviewed and are negative.  Physical Exam:  BP 99/60  Pulse 69  Ht 4\' 10"  (1.473 m)  Wt 94 lb 12.8 oz (43.001 kg)  BMI 19.82 kg/m2 Patient is very pleasant and in no acute distress. She is anxious. Skin is warm and dry. Color is normal.  HEENT is unremarkable. Normocephalic/atraumatic. PERRL. Sclera are nonicteric. Neck is supple. No masses. No JVD. Lungs are clear. Cardiac exam shows a regular rate and rhythm. No palpable chest wall pain. She keeps rubbing her chest. Abdomen is soft. Extremities are without edema. Gait and ROM are intact. No gross neurologic deficits noted.  LABORATORY DATA: EKG shows sinus rhythm with no acute changes. Tracing reviewed with Dr. Elease Hashimoto.   Lab Results  Component Value Date   WBC 9.9 05/20/2012   HGB 13.7 05/20/2012   HCT 41.3 05/20/2012   PLT 255 05/20/2012   GLUCOSE 106* 05/20/2012   CHOL 138 05/20/2012   TRIG 79 05/20/2012   HDL 70 05/20/2012   LDLCALC 52 05/20/2012   ALT  13 05/20/2012   AST 21 05/20/2012   NA 142 05/20/2012   K 5.2 05/20/2012   CL 108 05/20/2012   CREATININE 1.12* 05/20/2012   BUN 11 05/20/2012   CO2 26 05/20/2012   TSH 5.946* 05/20/2012   INR 1.99* 08/26/2011   HGBA1C 5.8* 08/26/2011   Assessment / Plan: 1. Chest tightness - known CAD with prior anterior MI and DES to the LAD - negative Myoview in October. Recurrent chest tightness. Difficult for her to say if this is like her prior chest pain syndrome. I think cardiac cath is our only option at this point. I have discussed with Dr. Elease Hashimoto and he is in agreement. Will add low dose Metoprolol.   Patient is agreeable to this plan and will call if any problems develop in the interim.   Rosalio Macadamia, RN, ANP-C [facility] [address] [place], [place]  [address]

## 2012-06-08 NOTE — Plan of Care (Signed)
Problem: Consults Goal: Tobacco Cessation referral if indicated Outcome: Not Applicable Date Met:  06/08/12 Non Smoker

## 2012-06-08 NOTE — H&P (Signed)
Pamela Kidd  06/08/2012 11:30 AM   Office Visit  MRN:  [id number]   Description: 67 year old female  Provider: Rosalio Macadamia, NP  Department: [facility]        Referring Provider    Donita Brooks, MD      Diagnoses    Chest pain, mid sternal    -  Primary    786.51      Reason for Visit    Chest Pain    Work in visit. Seen for Dr. Swaziland.          Progress Notes    Rosalio Macadamia, NP at 06/08/2012 12:41 PM    Status: Signed                      [name] Date of Birth: 17-May-1945 Medical Record #[id number]   History of Present Illness: Pamela Kidd is seen back today for a work in visit. Seen for Dr. Swaziland. Has known CAD with anterior MI in August of 2013 treated with DES of the proximal LAD. EF has recovered. Negative Myoview in October of 2013. Other issues include HLD and GERD. Has rate PVCs on past event monitor. Chronically poor eater due to mouth issues. Is underweight.    Just seen earlier this month and felt to be doing ok.   Comes in today. She is here with her husband. She returned from the beach last night. Went to bed. Woke up with midsternal chest pain. This was fleeting but recurrent. Called to be seen today. While in the waiting room and in the exam room - has chest tightness with shortness of breath. Does not use NTG due to headache. Very anxious. She remains on chronic PPI therapy and is on Plavix.     Current Outpatient Prescriptions on File Prior to Visit   Medication  Sig  Dispense  Refill   .  aspirin EC 81 MG EC tablet  Take 1 tablet (81 mg total) by mouth daily.         .  Cholecalciferol (VITAMIN D) 2000 UNITS CAPS  Take by mouth every morning.         .  clopidogrel (PLAVIX) 75 MG tablet  Take 1 tablet (75 mg total) by mouth daily.   90 tablet   3   .  nitroGLYCERIN (NITROSTAT) 0.4 MG SL tablet  Place 1 tablet (0.4 mg total) under the tongue every 5 (five) minutes x 3 doses as needed for chest pain.   30 tablet   3   .   pantoprazole (PROTONIX) 20 MG tablet  Take 1 tablet (20 mg total) by mouth daily.   90 tablet   3   .  polyethylene glycol (MIRALAX / GLYCOLAX) packet  Take 17 g by mouth as needed.         .  rosuvastatin (CRESTOR) 20 MG tablet  Take 1 tablet (20 mg total) by mouth daily.   90 tablet   3       No current facility-administered medications on file prior to visit.         Allergies   Allergen  Reactions   .  Ampicillin  Itching   .  Codeine  Itching and Nausea And Vomiting   .  Nubain (Nalbuphine Hcl)  Other (See Comments)       "headache & disoriented"  Past Medical History   Diagnosis  Date   .  CAD (coronary artery disease)         a. 08/26/2011 Acute Ant STEMI/Cath/PCI: LM nl, LAD 100p -> 2.75x23 Xience Xpedition, LCX 20, minor irregs, RCA 20-79mid, EF 30-35%, anterior/apical AK   .  Ischemic cardiomyopathy         a. 08/27/2011 Echo: EF 35-40%, mid-dist anteroseptal/apical akinesis, Gr 1 DD, mild-mod TR, PASP   .  Hypotension         a. preventing use of ACEI   .  Diverticulosis     .  Oral cancer  2003       a. s/p radiation/chemotherapy   .  Hyperlipidemia           Past Surgical History   Procedure  Laterality  Date   .  Foot surgery       .  Left shoulder       .  Abdominal hysterectomy           partial         History   Smoking status   .  Never Smoker    Smokeless tobacco   .  Never Used         History   Alcohol Use  No         Family History   Problem  Relation  Age of Onset   .  Dementia  Mother     .  Heart disease  Father     .  Cancer  Father         prostate   .  Heart disease  Paternal Grandfather          Review of Systems: The review of systems is per the HPI.  All other systems were reviewed and are negative.   Physical Exam: BP 99/60  Pulse 69  Ht 4\' 10"  (1.473 m)  Wt 94 lb 12.8 oz (43.001 kg)  BMI 19.82 kg/m2 Patient is very pleasant and in no acute distress. She is anxious. Skin is warm and dry.  Color is normal.  HEENT is unremarkable. Normocephalic/atraumatic. PERRL. Sclera are nonicteric. Neck is supple. No masses. No JVD. Lungs are clear. Cardiac exam shows a regular rate and rhythm. No palpable chest wall pain. She keeps rubbing her chest. Abdomen is soft. Extremities are without edema. Gait and ROM are intact. No gross neurologic deficits noted.   LABORATORY DATA: EKG shows sinus rhythm with no acute changes. Tracing reviewed with Dr. Elease Hashimoto.      Lab Results   Component  Value  Date     WBC  9.9  05/20/2012     HGB  13.7  05/20/2012     HCT  41.3  05/20/2012     PLT  255  05/20/2012     GLUCOSE  106*  05/20/2012     CHOL  138  05/20/2012     TRIG  79  05/20/2012     HDL  70  05/20/2012     LDLCALC  52  05/20/2012     ALT  13  05/20/2012     AST  21  05/20/2012     NA  142  05/20/2012     K  5.2  05/20/2012     CL  108  05/20/2012     CREATININE  1.12*  05/20/2012     BUN  11  05/20/2012     CO2  26  05/20/2012     TSH  5.946*  05/20/2012     INR  1.99*  08/26/2011     HGBA1C  5.8*  08/26/2011      Assessment / Plan: 1. Chest tightness - known CAD with prior anterior MI and DES to the LAD - negative Myoview in October. Recurrent chest tightness. Difficult for her to say if this is like her prior chest pain syndrome. I think cardiac cath is our only option at this point to further define.  I have discussed with Dr. Elease Hashimoto and he is in agreement. Will add low dose Metoprolol.    Patient is agreeable to this plan and will call if any problems develop in the interim.    Rosalio Macadamia, RN, ANP-C [facility] [address] [place], [place]  [address]             Encounter-Level Documents:           Electronic signature on 06/08/2012 11:27 AM          Vitals - Last Recorded    BP Pulse Ht Wt BMI      99/60 69 4\' 10"  (1.473 m) 94 lb 12.8 oz (43.001 kg) 19.82 kg/m2         Vitals History Recorded             Level of Service    PR OFFICE OUTPATIENT VISIT 25  MINUTES [99214]      Follow-up and Disposition    Routing History Recorded        All Charges for This Encounter    Code Description Service Date Service Provider Modifiers Qty    93000 PR ELECTROCARDIOGRAM, COMPLETE 06/08/2012 Rosalio Macadamia, NP   1    [phone] PR OFFICE OUTPATIENT VISIT 25 MINUTES 06/08/2012 Rosalio Macadamia, NP   1      Patient Instructions    We are going to admit you to the hospital today.      Previous Visit      Provider Department Encounter #    06/08/2012  8:38 AM Norma Fredrickson, NP [facility] [id number]     Attending Note:   The patient was seen and examined.  Agree with assessment and plan as noted above.  Changes made to the above note as needed.  Agree with plans for admission.   Vesta Mixer, Montez Hageman., MD, [facility] 06/09/2012, 9:53 AM

## 2012-06-08 NOTE — Patient Instructions (Signed)
We are going to admit you to the hospital today. 

## 2012-06-08 NOTE — Telephone Encounter (Signed)
Returned call to patient she stated when she went to bed last night she had 4 episodes of a sharp chest pain.Stated each episode lasted less than 1 min.Stated pain started in mid chest and radiated under left breast.Stated she has not had any pain this morning just is sob.Stated she is always a little sob.Appointment scheduled with Norma Fredrickson NP today 06/08/12 at 11:30.

## 2012-06-08 NOTE — Progress Notes (Addendum)
ANTICOAGULATION CONSULT NOTE - Initial Consult  Pharmacy Consult for Lovenox Indication: chest pain/ACS  Allergies  Allergen Reactions  . Ampicillin Itching  . Codeine Itching and Nausea And Vomiting  . Nubain (Nalbuphine Hcl) Other (See Comments)    "headache & disoriented"      Patient Measurements: Height: 4\' 10"  (147.3 cm) Weight: 93 lb 4.1 oz (42.3 kg) IBW/kg (Calculated) : 40.9  Vital Signs: Temp: 98.3 F (36.8 C) (05/21 1330) Temp src: Oral (05/21 1330) BP: 108/63 mmHg (05/21 1330) Pulse Rate: 58 (05/21 1330)  Labs (from 05/20/12): SCr 1.12 WBC 9.9 H/H 13.7/41.3 Platelets 255  No results found for this basename: HGB, HCT, PLT, APTT, LABPROT, INR, HEPARINUNFRC, CREATININE, CKTOTAL, CKMB, TROPONINI,  in the last 72 hours  Estimated Creatinine Clearance: 31.9 ml/min (by C-G formula based on Cr of 1.12).   Medical History: Past Medical History  Diagnosis Date  . CAD (coronary artery disease)     a. 08/26/2011 Acute Ant STEMI/Cath/PCI: LM nl, LAD 100p -> 2.75x23 Xience Xpedition, LCX 20, minor irregs, RCA 20-74mid, EF 30-35%, anterior/apical AK  . Ischemic cardiomyopathy     a. 08/27/2011 Echo: EF 35-40%, mid-dist anteroseptal/apical akinesis, Gr 1 DD, mild-mod TR, PASP  . Hypotension     a. preventing use of ACEI  . Diverticulosis   . Oral cancer 2003    a. s/p radiation/chemotherapy  . Hyperlipidemia    Medications:  Prescriptions prior to admission  Medication Sig Dispense Refill  . aspirin EC 81 MG EC tablet Take 1 tablet (81 mg total) by mouth daily.      . Cholecalciferol (VITAMIN D) 2000 UNITS CAPS Take by mouth every morning.      . clopidogrel (PLAVIX) 75 MG tablet Take 1 tablet (75 mg total) by mouth daily.  90 tablet  3  . nitroGLYCERIN (NITROSTAT) 0.4 MG SL tablet Place 1 tablet (0.4 mg total) under the tongue every 5 (five) minutes x 3 doses as needed for chest pain.  30 tablet  3  . pantoprazole (PROTONIX) 20 MG tablet Take 1 tablet (20 mg  total) by mouth daily.  90 tablet  3  . polyethylene glycol (MIRALAX / GLYCOLAX) packet Take 17 g by mouth as needed.      . rosuvastatin (CRESTOR) 20 MG tablet Take 1 tablet (20 mg total) by mouth daily.  90 tablet  3    Assessment: 67 y/o female with a history of CAD (DES to LAD in 08/2011) who woke up today with midsternal chest pain. She had a negative myoview in October 2013. Pharmacy consulted to begin Lovenox for chest pain. Plan is for cath.  Goal of Therapy:  Anti-Xa level 0.6-1.2 units/ml 4hrs after LMWH dose given Monitor platelets by anticoagulation protocol: Yes   Plan:  -Lovenox 40 mg SQ q12h -CBC q72h while on Lovenox -F/U labs being drawn today -Monitor renal function and for s/sx of bleeding  [facility] [place].D., BCPS Clinical Pharmacist Pager: [phone] 06/08/2012 2:35 PM   Addendum: Baseline labs below are wnl. Continue plan as above.  SCr 1.19 H/H 13/39.3 Platelets [address].D., BCPS Clinical Pharmacist Pager: [phone] 06/08/2012 3:25 PM

## 2012-06-08 NOTE — Progress Notes (Signed)
Utilization Review Completed.Pamela Kidd T5/21/2014  

## 2012-06-09 ENCOUNTER — Ambulatory Visit (HOSPITAL_COMMUNITY): Admission: AD | Admit: 2012-06-09 | Payer: Medicare Other | Source: Ambulatory Visit | Admitting: Cardiovascular Disease

## 2012-06-09 ENCOUNTER — Encounter (HOSPITAL_COMMUNITY): Payer: Self-pay | Admitting: Physician Assistant

## 2012-06-09 ENCOUNTER — Encounter (HOSPITAL_COMMUNITY)
Admission: AD | Disposition: A | Discharge status: Home / Self Care | Payer: Self-pay | Source: Ambulatory Visit | Attending: Cardiology

## 2012-06-09 DIAGNOSIS — R072 Precordial pain: Principal | ICD-10-CM | POA: Diagnosis present

## 2012-06-09 DIAGNOSIS — I2510 Atherosclerotic heart disease of native coronary artery without angina pectoris: Secondary | ICD-10-CM

## 2012-06-09 HISTORY — PX: CARDIAC CATHETERIZATION: SHX172

## 2012-06-09 HISTORY — PX: LEFT HEART CATHETERIZATION WITH CORONARY ANGIOGRAM: SHX5451

## 2012-06-09 LAB — LIPID PANEL
Cholesterol: 112 mg/dL (ref 0–200)
HDL: 56 mg/dL (ref >39)
LDL Cholesterol: 35 mg/dL (ref 0–99)
Total CHOL/HDL Ratio: 2 RATIO
Triglycerides: 104 mg/dL (ref <150)
VLDL: 21 mg/dL (ref 0–40)

## 2012-06-09 LAB — TROPONIN I: Troponin I: <0.3 ng/mL (ref <0.30)

## 2012-06-09 SURGERY — LEFT HEART CATHETERIZATION WITH CORONARY ANGIOGRAM
Anesthesia: LOCAL

## 2012-06-09 MED ORDER — FENTANYL CITRATE 0.05 MG/ML IJ SOLN
INTRAMUSCULAR | Status: AC
  Filled 2012-06-09: qty 2

## 2012-06-09 MED ORDER — LIDOCAINE HCL (PF) 1 % IJ SOLN
INTRAMUSCULAR | Status: AC
  Filled 2012-06-09: qty 30

## 2012-06-09 MED ORDER — MIDAZOLAM HCL 5 MG/5ML IJ SOLN
INTRAMUSCULAR | Status: AC
  Filled 2012-06-09: qty 5

## 2012-06-09 MED ORDER — HEPARIN (PORCINE) IN NACL 2-0.9 UNIT/ML-% IJ SOLN
INTRAMUSCULAR | Status: AC
  Filled 2012-06-09: qty 1000

## 2012-06-09 MED ORDER — ACETAMINOPHEN 325 MG PO TABS
650.0000 mg | ORAL_TABLET | Freq: Once | ORAL | Status: AC
  Administered 2012-06-09: 650 mg via ORAL
  Filled 2012-06-09: qty 2

## 2012-06-09 MED ORDER — SODIUM CHLORIDE 0.9 % IV SOLN
1.0000 mL/kg/h | INTRAVENOUS | Status: DC
  Administered 2012-06-09 (×2): 1 mL/kg/h via INTRAVENOUS

## 2012-06-09 NOTE — Discharge Summary (Deleted)
Discharge Summary   Patient ID: Pamela Kidd,  MRN: [id number], DOB/AGE: [birth year] 67 y.o.  Admit date: 06/08/2012 Discharge date: 06/09/2012  Primary Physician: Leo Grosser, MD Primary Cardiologist: P. [place], MD  Discharge Diagnoses Principal Problem:   Precordial pain Active Problems:   Hypercholesterolemia   CAD (coronary artery disease), native coronary artery   Hypotension   Hyperlipidemia  Allergies Allergies  Allergen Reactions  . Ampicillin Itching  . Codeine Itching and Nausea And Vomiting  . Nubain (Nalbuphine Hcl) Other (See Comments)    "headache & disoriented"      Diagnostic Studies/Procedures  CARDIAC CATHETERIZATION - 06/09/12  Hemodynamics:  AO 91/52  LV 92/2  Coronary angiography:  Coronary dominance: right  Left mainstem: Small but no obstructive disease  Left anterior descending (LAD): Widely patent proximal LAD stent. Otherwise minor nonobstructive disease with patent diagonal and mid/distal LAD  Left circumflex (LCx): Mild ostial stenosis (30%), otherwise no disease noted. Large OM1 and moderate OM 2/3.  Right coronary artery (RCA): Dominant, widely patent throughout  Left ventriculography: Left ventricular systolic function is normal, LVEF is estimated at 65%, there is no significant mitral regurgitation  Final Conclusions:  1. Widely patent proximal LAD stent  2. Minor nonobstructive LCx stenosis  3. Normal RCA  4. Normal LV function  History of Present Illness Pamela Kidd is a 67 y.o. female who was transferred from the [facility] office to [facility] hospital yesterday to undergo diagnostic cardiac catheterization for precordial pain concerning for unstable angina.   She has a history of CAD (anterior MI in 08/2011 s/p DES-prox LAD, normal Myoview 10/2011), HLD and GERD. She had returned from the beach the day before yesterday and fell asleep at home. She woke up with midsternal chest discomfort which was fleeting but  recurred throughout the night. She called the office to be seen the following day. She reported associated shortness of breath. Given her cardiac history and fairly recent negative stress test, the decision was made to proceed with diagnostic cardiac catheterization. The risks and benefits of the procedure were discussed with the patient who agreed to proceed.  Hospital Course   She was transferred and arrived in stable condition. Low-dose BB was suggested, however BPs remained labile (SBP 80-90s) in the hospital, and this was held. BMET, CBC and TSH returned WNL. Serial troponins x 3 returned WNL. Lipid panel revealed LDL 35, HDL 56, TG 104, TC 112. She remained asymptomatic overnight and was evaluated by Dr. Excell Seltzer the following morning who agreed with cardiac catheterization.   She was informed, consented and prepped for the procedure which was accessed via the R femoral artery. As above, this revealed patent prox LAD stent, minor nonobstructive LAD disease, 30% ostial LCx, and otherwise widely patent cors; LVEF 65%. The recommendation was made to continue medical management. Noncardiac etiologies were suspected to represent her chest discomfort. Dr. Excell Seltzer evaluated the patient and deemed her stable for discharge post-cath. She will follow-up in 2-4 weeks. She will be called with the appointment date and time. She will be discharged on the medication regimen outlined below. Beta blocker was noted started due to labile blood pressures while hospitalized. She was advised to follow-up with her PCP in 1 week to evaluate for alternative causes to her chest discomfort. Given her history of GERD, reflux/esophageal etiologies should be considered. This information, including post cath instructions, has been clearly outlined in the discharge AVS. She has been asked to suspend travel for 1 week to allow  for recovery post-cath and evaluation of chest pain in the outpatient setting.   Discharge Vitals:  Blood  pressure 104/71, pulse 77, temperature 98.8 F (37.1 C), temperature source Oral, resp. rate 16, height 4\' 10"  (1.473 m), weight 42.3 kg (93 lb 4.1 oz), SpO2 99.00%.   Labs: Recent Labs     06/08/12  1432  WBC  7.1  HGB  13.0  HCT  39.3  MCV  88.7  PLT  239   Recent Labs Lab 06/08/12 1432  NA 142  K 4.3  CL 105  CO2 26  BUN 12  CREATININE 1.19*  CALCIUM 9.5  PROT 6.5  BILITOT 0.4  ALKPHOS 62  ALT 10  AST 18  GLUCOSE 99    Recent Labs     06/08/12  1432  06/08/12  2030  06/09/12  0345  TROPONINI  <0.30  <0.30  <0.30   Recent Labs     06/09/12  0345  CHOL  112  HDL  56  LDLCALC  35  TRIG  104  CHOLHDL  2.0    Recent Labs  06/08/12 1432  TSH 3.279    Disposition:  Discharge Orders   Future Appointments Provider Department Dept Phone   06/28/2012 11:20 AM [facility] [phone]   Patient should wear two piece clothing and wear no powder or deodorant. Patient should arrive 15 minutes early.   Future Orders Complete By Expires     Diet - low sodium heart healthy  As directed     Increase activity slowly  As directed         Discharge Medications:    Medication List    TAKE these medications       aspirin 81 MG EC tablet  Take 81 mg by mouth at bedtime.     clopidogrel 75 MG tablet  Commonly known as:  PLAVIX  Take 75 mg by mouth at bedtime.     nitroGLYCERIN 0.4 MG SL tablet  Commonly known as:  NITROSTAT  Place 1 tablet (0.4 mg total) under the tongue every 5 (five) minutes x 3 doses as needed for chest pain.     pantoprazole 20 MG tablet  Commonly known as:  PROTONIX  Take 20 mg by mouth at bedtime.     polyethylene glycol packet  Commonly known as:  MIRALAX / GLYCOLAX  Take 17 g by mouth as needed (for constipation).     rosuvastatin 20 MG tablet  Commonly known as:  CRESTOR  Take 20 mg by mouth at bedtime.     TYLENOL PO  Take 1 capsule by mouth daily as needed (for headache).      Vitamin D 2000 UNITS Caps  Take 1 capsule by mouth every morning.       Outstanding Labs/Studies: None  Duration of Discharge Encounter: Greater than 30 minutes including physician time.  Signed, R. Hurman Horn, PA-C 06/09/2012, 6:06 PM

## 2012-06-09 NOTE — Progress Notes (Signed)
Nutrition Brief Note  Patient identified on the Malnutrition Screening Tool (MST) Report.  Per records, patient has had a 7% weight loss in the past 5 months; not significant for time frame.  Wt Readings from Last 10 Encounters:  06/09/12 93 lb 4.1 oz (42.3 kg)  06/09/12 93 lb 4.1 oz (42.3 kg)  06/08/12 94 lb 12.8 oz (43.001 kg)  05/20/12 98 lb (44.453 kg)  05/19/12 96 lb 12 oz (43.886 kg)  02/11/12 101 lb 12.8 oz (46.176 kg)  12/31/11 102 lb 6.4 oz (46.448 kg)  12/08/11 102 lb (46.267 kg)  11/10/11 100 lb (45.36 kg)  11/04/11 99 lb 3.3 oz (45 kg)     Body mass index is 19.5 kg/(m^2). Patient meets criteria for Normal based on current BMI.   Current diet order is NPO; for cardiac cath procedure.  Labs and medications reviewed.   No nutrition interventions warranted at this time. If nutrition issues arise, please consult RD.   Maureen Chatters, RD, LDN Pager #: [phone] After-Hours Pager #: [phone]

## 2012-06-09 NOTE — Interval H&P Note (Signed)
History and Physical Interval Note:  06/09/2012 4:38 PM  Pamela Kidd  has presented today for surgery, with the diagnosis of unstable angia  The various methods of treatment have been discussed with the patient and family. After consideration of risks, benefits and other options for treatment, the patient has consented to  Procedure(s): LEFT HEART CATHETERIZATION WITH CORONARY ANGIOGRAM (N/A) as a surgical intervention .  The patient's history has been reviewed, patient examined, no change in status, stable for surgery.  I have reviewed the patient's chart and labs.  Questions were answered to the patient's satisfaction.     Tonny Bollman

## 2012-06-09 NOTE — Progress Notes (Signed)
 Patient Name: Pamela Kidd Date of Encounter: 06/09/2012  Principal Problem:   Precordial pain Active Problems:   Hypercholesterolemia   CAD (coronary artery disease), native coronary artery    SUBJECTIVE: Patient had no more chest pain overnight and denies shortness of breath or dizziness  OBJECTIVE Filed Vitals:   06/08/12 1330 06/08/12 2006 06/09/12 0306  BP: 108/63 97/59 89/55  Pulse: 58 65 55  Temp: 98.3 F (36.8 C) 98.3 F (36.8 C) 98.3 F (36.8 C)  TempSrc: Oral Oral Oral  Resp: 16 18 18  Height: 4' 10" (1.473 m)    Weight: 93 lb 4.1 oz (42.3 kg)  93 lb 4.1 oz (42.3 kg)  SpO2: 100% 99% 100%    Intake/Output Summary (Last 24 hours) at 06/09/12 0910 Last data filed at 06/08/12 2200  Gross per 24 hour  Intake    120 ml  Output      0 ml  Net    120 ml   Filed Weights   06/08/12 1330 06/09/12 0306  Weight: 93 lb 4.1 oz (42.3 kg) 93 lb 4.1 oz (42.3 kg)    PHYSICAL EXAM General: Well developed, well nourished, female in no acute distress. Head: Normocephalic, atraumatic.  Neck: Supple without bruits, JVD not elevated. Lungs:  Resp regular and unlabored, CTA bilaterally. Heart: RRR, S1, S2, no S3, S4, or murmur; no rub. Abdomen: Soft, non-tender, non-distended, BS + x 4.  Extremities: No clubbing, cyanosis, no edema.  Neuro: Alert and oriented X 3. Moves all extremities spontaneously. Psych: Normal affect.  LABS: CBC:  Recent Labs  06/08/12 1432  WBC 7.1  NEUTROABS 5.3  HGB 13.0  HCT 39.3  MCV 88.7  PLT 239   INR:  Recent Labs  06/08/12 1432  INR 1.03   Basic Metabolic Panel:  Recent Labs  06/08/12 1432  NA 142  K 4.3  CL 105  CO2 26  GLUCOSE 99  BUN 12  CREATININE 1.19*  CALCIUM 9.5  MG 2.4   Liver Function Tests:  Recent Labs  06/08/12 1432  AST 18  ALT 10  ALKPHOS 62  BILITOT 0.4  PROT 6.5  ALBUMIN 3.5   Cardiac Enzymes:  Recent Labs  06/08/12 1432 06/08/12 2030 06/09/12 0345  TROPONINI <0.30 <0.30 <0.30     Fasting Lipid Panel:  Recent Labs  06/09/12 0345  CHOL 112  HDL 56  LDLCALC 35  TRIG 104  CHOLHDL 2.0   Thyroid Function Tests:  Recent Labs  06/08/12 1432  TSH 3.279    TELE:  Sinus rhythm, sinus bradycardia   Radiology/Studies: No results found.   Current Medications:  . [START ON 06/10/2012] aspirin EC  81 mg Oral Daily  . cholecalciferol  2,000 Units Oral Daily  . diazepam  5 mg Oral On Call  . enoxaparin (LOVENOX) injection  40 mg Subcutaneous Q12H  . metoprolol tartrate  12.5 mg Oral BID  . pantoprazole  20 mg Oral BID AC  . pneumococcal 23 valent vaccine  0.5 mL Intramuscular Tomorrow-1000  . rosuvastatin  20 mg Oral q1800  . sodium chloride  3 mL Intravenous Q12H  . sodium chloride  3 mL Intravenous Q12H   . sodium chloride 1 mL/kg/hr (06/09/12 0428)    ASSESSMENT AND PLAN: Principal Problem:   Precordial pain Active Problems:   Hypercholesterolemia   CAD (coronary artery disease), native coronary artery  66-year-old female with a history of coronary artery disease who was admitted for chest pain. Cardiac enzymes remained   negative and her ECG is not acute. Cardiac catheterization is scheduled today. Patient was given a small amount of clear liquids as her procedures scheduled until later this afternoon. The patient and husband aware.  Signed, Rhonda Barrett , PA-C 9:10 AM 06/09/2012  Patient seen, examined. Available data reviewed. Agree with findings, assessment, and plan as outlined by Rhonda Barrett, PA-C. Explained procedural risks, indication, and alternatives with patient. She understands and agrees to proceed.  Karis Rilling, M.D. 06/09/2012 2:00 PM   

## 2012-06-09 NOTE — CV Procedure (Signed)
   Cardiac Catheterization Procedure Note  Name: Pamela Kidd MRN: [id number] DOB: 01/05/1946  Procedure: Left Heart Cath, Selective Coronary Angiography, LV angiography  Indication: Recurrent Chest Pain, Known CAD with anterior MI 2013  Procedural details: The right groin was prepped, draped, and anesthetized with 1% lidocaine. Using modified Seldinger technique, a 5 French sheath was introduced into the right femoral artery. Standard Judkins catheters were used for coronary angiography and left ventriculography. Catheter exchanges were performed over a guidewire. There were no immediate procedural complications. The patient was transferred to the post catheterization recovery area for further monitoring.  Procedural Findings: Hemodynamics:  AO 91/52 LV 92/2   Coronary angiography: Coronary dominance: right  Left mainstem: Small but no obstructive disease  Left anterior descending (LAD): Widely patent proximal LAD stent. Otherwise minor nonobstructive disease with patent diagonal and mid/distal LAD  Left circumflex (LCx): Mild ostial stenosis (30%), otherwise no disease noted. Large OM1 and moderate OM 2/3.  Right coronary artery (RCA): Dominant, widely patent throughout  Left ventriculography: Left ventricular systolic function is normal, LVEF is estimated at 65%, there is no significant mitral regurgitation   Final Conclusions:   1. Widely patent proximal LAD stent 2. Minor nonobstructive LCx stenosis 3. Normal RCA 4. Normal LV function  Recommendations: Continue med management. Suspect noncardiac symptoms.  Tonny Bollman 06/09/2012, 5:08 PM

## 2012-06-09 NOTE — Discharge Summary (Signed)
Discharge Summary   Patient ID: Pamela Kidd,  MRN: [id number], DOB/AGE: 1945/08/16 67 y.o.  Admit date: 06/08/2012 Discharge date: 06/10/2012  Primary Physician: Leo Grosser, MD Primary Cardiologist: P. Swaziland, MD  Discharge Diagnoses Principal Problem:   Precordial pain Active Problems:   Hypercholesterolemia   CAD (coronary artery disease), native coronary artery   Hypotension   Hyperlipidemia   Allergies Allergies  Allergen Reactions  . Ampicillin Itching  . Codeine Itching and Nausea And Vomiting  . Nubain (Nalbuphine Hcl) Other (See Comments)    "headache & disoriented"      Diagnostic Studies/Procedures  CARDIAC CATHETERIZATION - 06/09/12  Hemodynamics:  AO 91/52  LV 92/2  Coronary angiography:  Coronary dominance: right  Left mainstem: Small but no obstructive disease  Left anterior descending (LAD): Widely patent proximal LAD stent. Otherwise minor nonobstructive disease with patent diagonal and mid/distal LAD  Left circumflex (LCx): Mild ostial stenosis (30%), otherwise no disease noted. Large OM1 and moderate OM 2/3.  Right coronary artery (RCA): Dominant, widely patent throughout  Left ventriculography: Left ventricular systolic function is normal, LVEF is estimated at 65%, there is no significant mitral regurgitation  Final Conclusions:  1. Widely patent proximal LAD stent  2. Minor nonobstructive LCx stenosis  3. Normal RCA  4. Normal LV function   History of Present Illness Pamela Kidd is a 67 y.o. female Pamela Kidd is a 67 y.o. female who was transferred from the [facility] office to [facility] hospital yesterday to undergo diagnostic cardiac catheterization for precordial pain concerning for unstable angina.   She has a history of CAD (anterior MI in 08/2011 s/p DES-prox LAD, normal Myoview 10/2011), HLD and GERD. She had returned from the beach the day before yesterday and fell asleep at home. She woke up with midsternal chest  discomfort which was fleeting but recurred throughout the night. She called the office to be seen the following day. She reported associated shortness of breath. Given her cardiac history and fairly recent negative stress test, the decision was made to proceed with diagnostic cardiac catheterization. The risks and benefits of the procedure were discussed with the patient who agreed to proceed.   Hospital Course  She was transferred and arrived in stable condition. Low-dose BB was suggested, however BPs remained labile (SBP 80-90s) in the hospital, and this was held. BMET, CBC and TSH returned WNL. Serial troponins x 3 returned WNL. Lipid panel revealed LDL 35, HDL 56, TG 104, TC 112. She remained asymptomatic overnight and was evaluated by Dr. Excell Seltzer the following morning who agreed with cardiac catheterization.   She was informed, consented and prepped for the procedure which was accessed via the R femoral artery. As above, this revealed patent prox LAD stent, minor nonobstructive LAD disease, 30% ostial LCx, and otherwise widely patent cors; LVEF 65%. The recommendation was made to continue medical management. Noncardiac etiologies were suspected to represent her chest discomfort. Dr. Excell Seltzer evaluated the patient and deemed her stable for discharge post-cath. She will follow-up in 2-4 weeks. She will be called with the appointment date and time. She will be discharged on the medication regimen outlined below. Beta blocker was noted started due to labile blood pressures while hospitalized. She was advised to follow-up with her PCP in 1 week to evaluate for alternative causes to her chest discomfort. Given her history of GERD, reflux/esophageal etiologies should be considered. This information, including post cath instructions, has been clearly outlined in the discharge AVS. She has  been asked to suspend travel for 1 week to allow for recovery post-cath and evaluation of chest pain in the outpatient setting.    Discharge Vitals:  Blood pressure 102/67, pulse 66, temperature 98.4 F (36.9 C), temperature source Oral, resp. rate 18, height 4\' 10"  (1.473 m), weight 93 lb 4.1 oz (42.3 kg), SpO2 98.00%.   Labs: Recent Labs     06/08/12  1432  WBC  7.1  HGB  13.0  HCT  39.3  MCV  88.7  PLT  239    Recent Labs Lab 06/08/12 1432 06/10/12 0415  NA 142 139  K 4.3 3.9  CL 105 107  CO2 26 24  BUN 12 14  CREATININE 1.19* 1.06  CALCIUM 9.5 8.8  PROT 6.5  --   BILITOT 0.4  --   ALKPHOS 62  --   ALT 10  --   AST 18  --   GLUCOSE 99 75    Recent Labs     06/08/12  1432  06/08/12  2030  06/09/12  0345  TROPONINI  <0.30  <0.30  <0.30   Recent Labs     06/09/12  0345  CHOL  112  HDL  56  LDLCALC  35  TRIG  104  CHOLHDL  2.0    Recent Labs  06/08/12 1432  TSH 3.279    Disposition:  Discharge Orders   Future Appointments Provider Department Dept Phone   06/28/2012 11:20 AM [facility] [place]  IMAGING [phone]   Patient should wear two piece clothing and wear no powder or deodorant. Patient should arrive 15 minutes early.   Future Orders Complete By Expires     Diet - low sodium heart healthy  As directed     Increase activity slowly  As directed           Follow-up Information   Follow up with [facility]. (Office will call you with an appointment date and time. To be scheduled in approximately 2-4 weeks. )    Contact information:   [address] [place] [address]       Follow up with [facility] TOM, MD. Schedule an appointment as soon as possible for a visit in 1 week. (For post-hospital follow-up and evaluation of alternative causes of chest pain. )    Contact information:   [address] [place] [address] [phone]       Discharge Medications:    Medication List    TAKE these medications       aspirin 81 MG EC tablet  Take 81 mg by mouth at bedtime.     clopidogrel 75 MG tablet   Commonly known as:  PLAVIX  Take 75 mg by mouth at bedtime.     nitroGLYCERIN 0.4 MG SL tablet  Commonly known as:  NITROSTAT  Place 1 tablet (0.4 mg total) under the tongue every 5 (five) minutes x 3 doses as needed for chest pain.     pantoprazole 20 MG tablet  Commonly known as:  PROTONIX  Take 20 mg by mouth at bedtime.     polyethylene glycol packet  Commonly known as:  MIRALAX / GLYCOLAX  Take 17 g by mouth as needed (for constipation).     rosuvastatin 20 MG tablet  Commonly known as:  CRESTOR  Take 20 mg by mouth at bedtime.     TYLENOL PO  Take 1 capsule by mouth daily as needed (for  headache).     Vitamin D 2000 UNITS Caps  Take 1 capsule by mouth every morning.       Outstanding Labs/Studies: None  Duration of Discharge Encounter: Greater than 30 minutes including physician time.  Signed, Nicolasa Ducking, NP  06/10/2012, 7:08 AM

## 2012-06-09 NOTE — H&P (View-Only) (Signed)
Patient Name: Pamela Kidd Date of Encounter: 06/09/2012  Principal Problem:   Precordial pain Active Problems:   Hypercholesterolemia   CAD (coronary artery disease), native coronary artery    SUBJECTIVE: Patient had no more chest pain overnight and denies shortness of breath or dizziness  OBJECTIVE Filed Vitals:   06/08/12 1330 06/08/12 2006 06/09/12 0306  BP: 108/63 97/59 89/55   Pulse: 58 65 55  Temp: 98.3 F (36.8 C) 98.3 F (36.8 C) 98.3 F (36.8 C)  TempSrc: Oral Oral Oral  Resp: 16 18 18   Height: 4\' 10"  (1.473 m)    Weight: 93 lb 4.1 oz (42.3 kg)  93 lb 4.1 oz (42.3 kg)  SpO2: 100% 99% 100%    Intake/Output Summary (Last 24 hours) at 06/09/12 0910 Last data filed at 06/08/12 2200  Gross per 24 hour  Intake    120 ml  Output      0 ml  Net    120 ml   Filed Weights   06/08/12 1330 06/09/12 0306  Weight: 93 lb 4.1 oz (42.3 kg) 93 lb 4.1 oz (42.3 kg)    PHYSICAL EXAM General: Well developed, well nourished, female in no acute distress. Head: Normocephalic, atraumatic.  Neck: Supple without bruits, JVD not elevated. Lungs:  Resp regular and unlabored, CTA bilaterally. Heart: RRR, S1, S2, no S3, S4, or murmur; no rub. Abdomen: Soft, non-tender, non-distended, BS + x 4.  Extremities: No clubbing, cyanosis, no edema.  Neuro: Alert and oriented X 3. Moves all extremities spontaneously. Psych: Normal affect.  LABS: CBC:  Recent Labs  06/08/12 1432  WBC 7.1  NEUTROABS 5.3  HGB 13.0  HCT 39.3  MCV 88.7  PLT 239   INR:  Recent Labs  06/08/12 1432  INR 1.03   Basic Metabolic Panel:  Recent Labs  16/10/96 1432  NA 142  K 4.3  CL 105  CO2 26  GLUCOSE 99  BUN 12  CREATININE 1.19*  CALCIUM 9.5  MG 2.4   Liver Function Tests:  Recent Labs  06/08/12 1432  AST 18  ALT 10  ALKPHOS 62  BILITOT 0.4  PROT 6.5  ALBUMIN 3.5   Cardiac Enzymes:  Recent Labs  06/08/12 1432 06/08/12 2030 06/09/12 0345  TROPONINI <0.30 <0.30 <0.30     Fasting Lipid Panel:  Recent Labs  06/09/12 0345  CHOL 112  HDL 56  LDLCALC 35  TRIG 104  CHOLHDL 2.0   Thyroid Function Tests:  Recent Labs  06/08/12 1432  TSH 3.279    TELE:  Sinus rhythm, sinus bradycardia   Radiology/Studies: No results found.   Current Medications:  . [START ON 06/10/2012] aspirin EC  81 mg Oral Daily  . cholecalciferol  2,000 Units Oral Daily  . diazepam  5 mg Oral On Call  . enoxaparin (LOVENOX) injection  40 mg Subcutaneous Q12H  . metoprolol tartrate  12.5 mg Oral BID  . pantoprazole  20 mg Oral BID AC  . pneumococcal 23 valent vaccine  0.5 mL Intramuscular Tomorrow-1000  . rosuvastatin  20 mg Oral q1800  . sodium chloride  3 mL Intravenous Q12H  . sodium chloride  3 mL Intravenous Q12H   . sodium chloride 1 mL/kg/hr (06/09/12 0428)    ASSESSMENT AND PLAN: Principal Problem:   Precordial pain Active Problems:   Hypercholesterolemia   CAD (coronary artery disease), native coronary artery  67 year old female with a history of coronary artery disease who was admitted for chest pain. Cardiac enzymes remained  negative and her ECG is not acute. Cardiac catheterization is scheduled today. Patient was given a small amount of clear liquids as her procedures scheduled until later this afternoon. The patient and husband aware.  Signed, Theodore Demark , PA-C 9:10 AM 06/09/2012  Patient seen, examined. Available data reviewed. Agree with findings, assessment, and plan as outlined by Theodore Demark, PA-C. Explained procedural risks, indication, and alternatives with patient. She understands and agrees to proceed.  Tonny Bollman, M.D. 06/09/2012 2:00 PM

## 2012-06-10 ENCOUNTER — Encounter (HOSPITAL_COMMUNITY): Payer: Self-pay | Admitting: Nurse Practitioner

## 2012-06-10 DIAGNOSIS — I2510 Atherosclerotic heart disease of native coronary artery without angina pectoris: Secondary | ICD-10-CM

## 2012-06-10 LAB — BASIC METABOLIC PANEL
BUN: 14 mg/dL (ref 6–23)
Calcium: 8.8 mg/dL (ref 8.4–10.5)
Creatinine, Ser: 1.06 mg/dL (ref 0.50–1.10)
GFR calc Af Amer: 62 mL/min — ABNORMAL LOW (ref >90)
GFR calc non Af Amer: 53 mL/min — ABNORMAL LOW (ref >90)
Glucose, Bld: 75 mg/dL (ref 70–99)
Potassium: 3.9 mEq/L (ref 3.5–5.1)

## 2012-06-10 NOTE — Progress Notes (Signed)
freq post cath VS completed.  VS stable, R groin remains a level 0.  Pt not wanting to get up to ambulate yet.  States will ambulate when has to get up to void.  Will continue to assess groin site throughout the night. Pamela Kidd

## 2012-06-10 NOTE — Progress Notes (Signed)
    Subjective:  No CP or dyspnea. No c/o this am.   Objective:  Vital Signs in the last 24 hours: Temp:  [98 F (36.7 C)-98.9 F (37.2 C)] 98.4 F (36.9 C) (05/23 0500) Pulse Rate:  [58-79] 66 (05/23 0500) Resp:  [16-18] 18 (05/22 1855) BP: (88-113)/(53-72) 102/67 mmHg (05/23 0500) SpO2:  [98 %-100 %] 98 % (05/23 0500)  Intake/Output from previous day: 05/22 0701 - 05/23 0700 In: 571.2 [P.O.:120; I.V.:451.2] Out: -   Physical Exam: Pt is alert and oriented, NAD HEENT: normal Neck: JVP - normal, carotids 2+= without bruits Lungs: CTA bilaterally CV: RRR without murmur or gallop Abd: soft, NT, Positive BS, no hepatomegaly Ext: no C/C/E, distal pulses intact and equal, right groin clear Skin: warm/dry no rash  Lab Results:  Recent Labs  06/08/12 1432  WBC 7.1  HGB 13.0  PLT 239    Recent Labs  06/08/12 1432 06/10/12 0415  NA 142 139  K 4.3 3.9  CL 105 107  CO2 26 24  GLUCOSE 99 75  BUN 12 14  CREATININE 1.19* 1.06    Recent Labs  06/08/12 2030 06/09/12 0345  TROPONINI <0.30 <0.30    Cardiac Studies: Cath yesterday - as outlined  Tele: Sinus rhythm  Assessment/Plan:  Chest pain - patent stent. Normal LV function. Continue current medical therapy. Home this am. Follow-up as planned.  Tonny Bollman, M.D. 06/10/2012, 6:58 AM

## 2012-06-10 NOTE — Progress Notes (Signed)
Assessment unchanged. Discussed D/C instructions with pt. Verbalized understanding. No new RX. IV and tele removed. Pt left via W/C accompanied by [employer].

## 2012-06-16 ENCOUNTER — Encounter: Payer: Self-pay | Admitting: Family Medicine

## 2012-06-16 ENCOUNTER — Ambulatory Visit (INDEPENDENT_AMBULATORY_CARE_PROVIDER_SITE_OTHER): Payer: Medicare Other | Admitting: Family Medicine

## 2012-06-16 VITALS — BP 110/70 | HR 64 | Temp 98.4°F | Resp 18 | Wt 95.0 lb

## 2012-06-16 DIAGNOSIS — R109 Unspecified abdominal pain: Secondary | ICD-10-CM

## 2012-06-16 LAB — CBC WITH DIFFERENTIAL/PLATELET
Basophils Absolute: 0 10*3/uL (ref 0.0–0.1)
Eosinophils Absolute: 0.1 10*3/uL (ref 0.0–0.7)
Eosinophils Relative: 2 % (ref 0–5)
HCT: 39.7 % (ref 36.0–46.0)
Lymphs Abs: 1.2 10*3/uL (ref 0.7–4.0)
MCH: 29.2 pg (ref 26.0–34.0)
MCV: 87.8 fL (ref 78.0–100.0)
Monocytes Absolute: 0.5 10*3/uL (ref 0.1–1.0)
Platelets: 264 10*3/uL (ref 150–400)
RDW: 14.3 % (ref 11.5–15.5)

## 2012-06-16 LAB — COMPLETE METABOLIC PANEL WITH GFR
ALT: 10 U/L (ref 0–35)
AST: 18 U/L (ref 0–37)
Albumin: 4.2 g/dL (ref 3.5–5.2)
Alkaline Phosphatase: 66 U/L (ref 39–117)
Calcium: 9.5 mg/dL (ref 8.4–10.5)
Chloride: 106 mEq/L (ref 96–112)
Potassium: 3.8 mEq/L (ref 3.5–5.3)
Sodium: 143 mEq/L (ref 135–145)

## 2012-06-16 NOTE — Progress Notes (Signed)
Subjective:    Patient ID: Pamela Kidd, female    DOB: [birth year], 67 y.o.   MRN: [id number]  HPI Patient was recently admitted from  May 21 of May 23. She had substernal chest pain. The pain was colicky in nature. It was sharp and intense and lasted moments. It began in the middle the night while she was lying in bed. She then awoke the following morning and with her cardiologist's office. While at the office she developed a pressure-like sensation in her chest. She was then admitted to the hospital. A catheterization was performed of her coronary arteries which was normal. She was then discharged home to followup here to evaluate for other sources of her chest pain. She is also complaining of some pain in her right upper quadrant. She's been taking protonix 40 mg by mouth daily for over 3 months. She denies any melena or hematochezia. However, I am concerned because she has lost 3 pounds in over a month due to lack of appetite.  She had a right upper quadrant ultrasound in April 2013 which was normal. Past Medical History  Diagnosis Date  . CAD (coronary artery disease)     a. 08/26/2011 Acute Ant STEMI/Cath/PCI: LM nl, LAD 100p -> 2.75x23 Xience Xpedition, LCX 20, minor irregs, RCA 20-38mid, EF 30-35%, anterior/apical AK;  b. 05/2012 Cath: LM nl, LAD patent LAD stent, LCX 30ost, RCA dom, nl, EF 65%.  . Ischemic cardiomyopathy     a. 08/27/2011 Echo: EF 35-40%, mid-dist anteroseptal/apical akinesis, Gr 1 DD, mild-mod TR, PASP  . Hypotension     a. preventing use of ACEI  . Diverticulosis   . Hyperlipidemia   . STEMI (ST elevation myocardial infarction) 08/2011    anterior/notes 09/07/2011 (06/08/2012)  . Anginal pain 08/28/2011; 06/07/2012    "woke up twice last night w/ chest pain" (06/08/2012)  . Asthmatic bronchitis     "as a child; haven't had any since ~ I was 50 yr old" (06/08/2012)  . Shortness of breath     "mostly when I'm just sitting; related to heart pain" (06/08/2012)  . Anemia    "I've always been anemic; never had a transfusion" (06/08/2012)  . H/O hiatal hernia   . Oral cancer 2003    a. s/p radiation/chemotherapy   Current Outpatient Prescriptions on File Prior to Visit  Medication Sig Dispense Refill  . Acetaminophen (TYLENOL PO) Take 1 capsule by mouth daily as needed (for headache).      Marland Kitchen aspirin 81 MG EC tablet Take 81 mg by mouth at bedtime.      . Cholecalciferol (VITAMIN D) 2000 UNITS CAPS Take 1 capsule by mouth every morning.       . clopidogrel (PLAVIX) 75 MG tablet Take 75 mg by mouth at bedtime.      . nitroGLYCERIN (NITROSTAT) 0.4 MG SL tablet Place 1 tablet (0.4 mg total) under the tongue every 5 (five) minutes x 3 doses as needed for chest pain.  30 tablet  3  . pantoprazole (PROTONIX) 20 MG tablet Take 20 mg by mouth at bedtime.      . polyethylene glycol (MIRALAX / GLYCOLAX) packet Take 17 g by mouth as needed (for constipation).       . rosuvastatin (CRESTOR) 20 MG tablet Take 20 mg by mouth at bedtime.       No current facility-administered medications on file prior to visit.   Allergies  Allergen Reactions  . Ampicillin Itching  . Codeine Itching and  Nausea And Vomiting  . Nubain (Nalbuphine Hcl) Other (See Comments)    "headache & disoriented"     History   Social History  . Marital Status: Married    Spouse Name: N/A    Number of Children: N/A  . Years of Education: N/A   Occupational History  . Not on file.   Social History Main Topics  . Smoking status: Never Smoker   . Smokeless tobacco: Never Used  . Alcohol Use: No  . Drug Use: No  . Sexually Active: Not Currently   Other Topics Concern  . Not on file   Social History Narrative  . No narrative on file      Review of Systems  All other systems reviewed and are negative.       Objective:   Physical Exam  Vitals reviewed. Constitutional: She appears well-developed and well-nourished.  Neck: Neck supple. No thyromegaly present.  Cardiovascular: Normal  rate, regular rhythm and normal heart sounds.  Exam reveals no gallop and no friction rub.   No murmur heard. Pulmonary/Chest: Effort normal and breath sounds normal. No respiratory distress. She has no wheezes. She has no rales. She exhibits no tenderness.  Abdominal: Soft. Bowel sounds are normal. She exhibits no distension. There is no tenderness. There is no rebound and no guarding.  Lymphadenopathy:    She has no cervical adenopathy.          Assessment & Plan:  1. Abdominal  pain, other specified site Her pain sounds like biliary colic. I must start with an ultrasound of the gallbladder. If this is normal as it was last year, I would recommend proceeding with an HIDA scan to look for biliary dyskinesia.  If both tests are normal GI for an EGD, especially given her weight loss. - COMPLETE METABOLIC PANEL WITH GFR - CBC with Differential - Helicobacter pylori abs-IgG+IgA, bld - US Abdomen Limited RUQ; Future - Ambulatory referral to Gastroenterology

## 2012-06-17 ENCOUNTER — Ambulatory Visit
Admission: RE | Admit: 2012-06-17 | Discharge: 2012-06-17 | Disposition: A | Discharge status: Home / Self Care | Payer: Medicare Other | Source: Ambulatory Visit | Attending: Family Medicine | Admitting: Family Medicine

## 2012-06-17 ENCOUNTER — Other Ambulatory Visit: Payer: Self-pay | Admitting: Family Medicine

## 2012-06-17 DIAGNOSIS — R109 Unspecified abdominal pain: Secondary | ICD-10-CM

## 2012-06-24 ENCOUNTER — Ambulatory Visit (INDEPENDENT_AMBULATORY_CARE_PROVIDER_SITE_OTHER): Payer: Medicare Other | Admitting: Nurse Practitioner

## 2012-06-24 ENCOUNTER — Encounter: Payer: Self-pay | Admitting: Nurse Practitioner

## 2012-06-24 VITALS — BP 98/68 | HR 64 | Ht <= 58 in | Wt 95.8 lb

## 2012-06-24 DIAGNOSIS — I2510 Atherosclerotic heart disease of native coronary artery without angina pectoris: Secondary | ICD-10-CM

## 2012-06-24 NOTE — Patient Instructions (Addendum)
Stay on your current medicines  We have gotten you an appointment with Dr. Luisa Hart to discuss your gallbladder  I will talk with Dr. Swaziland about your plavix and possible surgery.  See Dr. Swaziland in 3 months  Call the [facility] office at [phone] if you have any questions, problems or concerns.

## 2012-06-24 NOTE — Progress Notes (Addendum)
[name] Date of Birth: Apr 27, 1945 Medical Record #[id number]  History of Present Illness:  [name] is seen back today for a post cath visit. Seen for Dr. Swaziland. Has known CAD with anterior MI in August of 2013 treated with DES of the proximal LAD. EF has recovered. Negative Myoview in October of 2013. Other issues include HLD and GERD. Has rate PVCs on past event monitor. Chronically poor eater due to mouth issues. Is underweight.   Seen about 3 weeks ago with chest pain. Was admitted from the office and referred for cardiac cath. This turned out to be ok. Has been back to her PCP - had a limited abdominal US which is positive for gall stones and sludge. She is to see general surgery.   Comes back today. She is here with her husband. Doing ok. Doesn't have an appointment with the surgeon yet. She is anxious. No chest pain. Feels ok. Primary issues still center around eating. Weight is up a pound.    Current Outpatient Prescriptions on File Prior to Visit  Medication Sig Dispense Refill  . Acetaminophen (TYLENOL PO) Take 1 capsule by mouth daily as needed (for headache).      Marland Kitchen aspirin 81 MG EC tablet Take 81 mg by mouth at bedtime.      . Cholecalciferol (VITAMIN D) 2000 UNITS CAPS Take 1 capsule by mouth every morning.       . clopidogrel (PLAVIX) 75 MG tablet Take 75 mg by mouth at bedtime.      . nitroGLYCERIN (NITROSTAT) 0.4 MG SL tablet Place 1 tablet (0.4 mg total) under the tongue every 5 (five) minutes x 3 doses as needed for chest pain.  30 tablet  3  . pantoprazole (PROTONIX) 20 MG tablet Take 20 mg by mouth at bedtime.      . polyethylene glycol (MIRALAX / GLYCOLAX) packet Take 17 g by mouth as needed (for constipation).       . rosuvastatin (CRESTOR) 20 MG tablet Take 20 mg by mouth at bedtime.       No current facility-administered medications on file prior to visit.    Allergies  Allergen Reactions  . Ampicillin Itching  . Codeine Itching and Nausea And Vomiting    . Nubain (Nalbuphine Hcl) Other (See Comments)    "headache & disoriented"      Past Medical History  Diagnosis Date  . CAD (coronary artery disease)     a. 08/26/2011 Acute Ant STEMI/Cath/PCI: LM nl, LAD 100p -> 2.75x23 Xience Xpedition, LCX 20, minor irregs, RCA 20-52mid, EF 30-35%, anterior/apical AK;  b. 05/2012 Cath: LM nl, LAD patent LAD stent, LCX 30ost, RCA dom, nl, EF 65%.  . Ischemic cardiomyopathy     a. 08/27/2011 Echo: EF 35-40%, mid-dist anteroseptal/apical akinesis, Gr 1 DD, mild-mod TR, PASP  . Hypotension     a. preventing use of ACEI  . Diverticulosis   . Hyperlipidemia   . STEMI (ST elevation myocardial infarction) 08/2011    anterior/notes 09/07/2011 (06/08/2012)  . Anginal pain 08/28/2011; 06/07/2012    "woke up twice last night w/ chest pain" (06/08/2012)  . Asthmatic bronchitis     "as a child; haven't had any since ~ I was 38 yr old" (06/08/2012)  . Shortness of breath     "mostly when I'm just sitting; related to heart pain" (06/08/2012)  . Anemia     "I've always been anemic; never had a transfusion" (06/08/2012)  . H/O hiatal hernia   .  Oral cancer 2003    a. s/p radiation/chemotherapy    Past Surgical History  Procedure Laterality Date  . Bunionectomy Bilateral 1970's  . Shoulder open rotator cuff repair Left 1990's  . Tonsillectomy  ~ 1951  . Vaginal hysterectomy  ~ 1989    partial  . Tubal ligation  1975  . Coronary angioplasty with stent placement  08/2011    "1" (06/08/2012)  . Cardiac catheterization  06/09/2012    patent prox LAD stent, minor nonobstructive LAD disease, 30% ostial LCx, and otherwise widely patent cors; LVEF 65%    History  Smoking status  . Never Smoker   Smokeless tobacco  . Never Used    History  Alcohol Use No    Family History  Problem Relation Age of Onset  . Dementia Mother   . Heart disease Father   . Cancer Father     prostate  . Heart disease Paternal Grandfather     Review of Systems: The review of  systems is per the HPI.  All other systems were reviewed and are negative.  Physical Exam: BP 98/68  Pulse 64  Ht 4\' 10"  (1.473 m)  Wt 95 lb 12.8 oz (43.455 kg)  BMI 20.03 kg/m2 Patient is very pleasant and in no acute distress. Skin is warm and dry. Color is normal.  HEENT is unremarkable. Normocephalic/atraumatic. PERRL. Sclera are nonicteric. Neck is supple. No masses. No JVD. Lungs are clear. Cardiac exam shows a regular rate and rhythm. Abdomen is soft. Extremities are without edema. Gait and ROM are intact. No gross neurologic deficits noted.  LABORATORY DATA:  Lab Results  Component Value Date   WBC 6.4 06/16/2012   HGB 13.2 06/16/2012   HCT 39.7 06/16/2012   PLT 264 06/16/2012   GLUCOSE 128* 06/16/2012   CHOL 112 06/09/2012   TRIG 104 06/09/2012   HDL 56 06/09/2012   LDLCALC 35 06/09/2012   ALT 10 06/16/2012   AST 18 06/16/2012   NA 143 06/16/2012   K 3.8 06/16/2012   CL 106 06/16/2012   CREATININE 1.13* 06/16/2012   BUN 11 06/16/2012   CO2 26 06/16/2012   TSH 3.279 06/08/2012   INR 1.03 06/08/2012   HGBA1C 5.8* 08/26/2011   Coronary angiography:   Left mainstem: Small but no obstructive disease  Left anterior descending (LAD): Widely patent proximal LAD stent. Otherwise minor nonobstructive disease with patent diagonal and mid/distal LAD  Left circumflex (LCx): Mild ostial stenosis (30%), otherwise no disease noted. Large OM1 and moderate OM 2/3.  Right coronary artery (RCA): Dominant, widely patent throughout  Left ventriculography: Left ventricular systolic function is normal, LVEF is estimated at 65%, there is no significant mitral regurgitation   Final Conclusions:  1. Widely patent proximal LAD stent  2. Minor nonobstructive LCx stenosis  3. Normal RCA  4. Normal LV function   Recommendations: Continue med management. Suspect noncardiac symptoms.  Tonny Bollman  06/09/2012, 5:08 PM   Echo Study Conclusions from November 2013  - Left ventricle: The cavity size was  normal. Wall thickness was normal. Systolic function was normal. The estimated ejection fraction was in the range of 60% to 65%. - Tricuspid valve: Moderate regurgitation.   Assessment / Plan: 1. Chest pain/known CAD - with recent cardiac cath - her stent is patent. Symptoms most likely gallbladder in origin. To see general surgery. I have called [facility] and gotten her an appointment.   2. Past anterior MI in August of 2013 treated with DES of  the proximal LAD. EF has recovered per last echo. Recent cath shows stent patency. Should be an acceptable candidate for cholecystectomy. Will ask Dr. Swaziland to review regarding her Plavix therapy. She is 10 months out from her MI/stent.   3. HLD - on statin.  Patient is agreeable to this plan and will call if any problems develop in the interim.    Rosalio Macadamia, RN, ANP-C  [facility] [address] [place], [place]  [address]   Addendum: Dr. Swaziland has advised stopping Plavix 5 days prior to surgery if she does indeed proceed on with gallbladder removal.

## 2012-06-28 ENCOUNTER — Ambulatory Visit
Admission: RE | Admit: 2012-06-28 | Discharge: 2012-06-28 | Disposition: A | Discharge status: Home / Self Care | Payer: Medicare Other | Source: Ambulatory Visit | Attending: Family Medicine | Admitting: Family Medicine

## 2012-06-28 DIAGNOSIS — Z1231 Encounter for screening mammogram for malignant neoplasm of breast: Secondary | ICD-10-CM

## 2012-07-04 ENCOUNTER — Encounter (INDEPENDENT_AMBULATORY_CARE_PROVIDER_SITE_OTHER): Payer: Self-pay | Admitting: Surgery

## 2012-07-04 ENCOUNTER — Ambulatory Visit (INDEPENDENT_AMBULATORY_CARE_PROVIDER_SITE_OTHER): Payer: Medicare Other | Admitting: Surgery

## 2012-07-04 VITALS — BP 90/60 | HR 68 | Temp 99.1°F | Resp 15 | Ht <= 58 in | Wt 95.0 lb

## 2012-07-04 DIAGNOSIS — R1013 Epigastric pain: Secondary | ICD-10-CM | POA: Insufficient documentation

## 2012-07-04 NOTE — Progress Notes (Signed)
Patient ID: Pamela Kidd, female   DOB: 02/22/1945, 67 y.o.   MRN: [id number]  Chief Complaint  Patient presents with  . New Evaluation    eval gb    HPI Pamela Kidd is a 67 y.o. female.  Patient sent at the request of Dr. Tanya Nones for epigastric pain. The pain is dull in nature, constant located in her epigastrium and intermittent. It is a 2-3/10. He does not make it better or worse. No nausea or vomiting. No radiation of pain. It never completely goes away and when it flares up the pain is not severe. 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 CAD (coronary artery disease)     a. 08/26/2011 Acute Ant STEMI/Cath/PCI: LM nl, LAD 100p -> 2.75x23 Xience Xpedition, LCX 20, minor irregs, RCA 20-4mid, EF 30-35%, anterior/apical AK;  b. 05/2012 Cath: LM nl, LAD patent LAD stent, LCX 30ost, RCA dom, nl, EF 65%.  . Ischemic cardiomyopathy     a. 08/27/2011 Echo: EF 35-40%, mid-dist anteroseptal/apical akinesis, Gr 1 DD, mild-mod TR, PASP  . Hypotension     a. preventing use of ACEI  . Diverticulosis   . Hyperlipidemia   . STEMI (ST elevation myocardial infarction) 08/2011    anterior/notes 09/07/2011 (06/08/2012)  . Anginal pain 08/28/2011; 06/07/2012    "woke up twice last night w/ chest pain" (06/08/2012)  . Asthmatic bronchitis     "as a child; haven't had any since ~ I was 21 yr old" (06/08/2012)  . Shortness of breath     "mostly when I'm just sitting; related to heart pain" (06/08/2012)  . Anemia     "I've always been anemic; never had a transfusion" (06/08/2012)  . H/O hiatal hernia   . Oral cancer 2003    a. s/p radiation/chemotherapy  . Emphysema of lung     Past Surgical History  Procedure Laterality Date  . Bunionectomy Bilateral 1970's  . Shoulder open rotator cuff repair Left 1990's  . Tonsillectomy  ~ 1951  . Vaginal  hysterectomy  ~ 1989    partial  . Tubal ligation  1975  . Coronary angioplasty with stent placement  08/2011    "1" (06/08/2012)  . Cardiac catheterization  06/09/2012    patent prox LAD stent, minor nonobstructive LAD disease, 30% ostial LCx, and otherwise widely patent cors; LVEF 65%  . Rotator cuff repair    . Foot arthrodesis, modified mcbride      Family History  Problem Relation Age of Onset  . Dementia Mother   . Heart disease Father   . Cancer Father     prostate  . Heart disease Paternal Grandfather     Social History History  Substance Use Topics  . Smoking status: Never Smoker   . Smokeless tobacco: Never Used  . Alcohol Use: No    Allergies  Allergen Reactions  . Ampicillin Itching  . Codeine Itching and Nausea And Vomiting  . Nubain (Nalbuphine Hcl) Other (See Comments)    "headache & disoriented"      Current Outpatient Prescriptions  Medication Sig Dispense Refill  . Acetaminophen (TYLENOL PO) Take 1 capsule by mouth daily as needed (for headache).      Marland Kitchen aspirin 81 MG EC tablet Take 81 mg by mouth at bedtime.      Marland Kitchen  Cholecalciferol (VITAMIN D) 2000 UNITS CAPS Take 1 capsule by mouth every morning.       . clopidogrel (PLAVIX) 75 MG tablet Take 75 mg by mouth at bedtime.      . nitroGLYCERIN (NITROSTAT) 0.4 MG SL tablet Place 1 tablet (0.4 mg total) under the tongue every 5 (five) minutes x 3 doses as needed for chest pain.  30 tablet  3  . pantoprazole (PROTONIX) 20 MG tablet Take 20 mg by mouth at bedtime.      . polyethylene glycol (MIRALAX / GLYCOLAX) packet Take 17 g by mouth as needed (for constipation).       . rosuvastatin (CRESTOR) 20 MG tablet Take 20 mg by mouth at bedtime.       No current facility-administered medications for this visit.    Review of Systems Review of Systems  Constitutional: Positive for fatigue.  HENT: Negative for facial swelling.   Eyes: Negative.   Respiratory: Negative.   Cardiovascular: Negative.     Gastrointestinal: Positive for abdominal pain.  Endocrine: Negative.   Genitourinary: Negative.   Musculoskeletal: Negative.   Skin: Negative.   Allergic/Immunologic: Negative.   Neurological: Negative.   Hematological: Negative.   Psychiatric/Behavioral: Negative.     Blood pressure 90/60, pulse 68, temperature 99.1 F (37.3 C), temperature source Temporal, resp. rate 15, height 4\' 10"  (1.473 m), weight 95 lb (43.092 kg).  Physical Exam Physical Exam  Constitutional: She is oriented to person, place, and time. She appears well-developed and well-nourished.  HENT:  Head: Normocephalic.  Eyes: EOM are normal. Pupils are equal, round, and reactive to light.  Neck: Normal range of motion. Neck supple.  Cardiovascular: Normal rate.   Pulmonary/Chest: Effort normal and breath sounds normal.  Abdominal: Soft. Normal appearance. There is no tenderness. There is no rebound and negative Murphy's sign. No hernia.    Musculoskeletal: Normal range of motion.  Neurological: She is alert and oriented to person, place, and time.  Skin: Skin is warm and dry.  Psychiatric: She has a normal mood and affect. Her behavior is normal. Judgment and thought content normal.    Data Reviewed [place]/S REPORTS 2013 AND 2014 sludge and small stones questionable.  Assessment    Abdominal pain with ultrasound that shows sludge and possibly small stones    Plan    Her symptoms are uncharacteristic of biliary colic. Her pain is atypical for biliary colic. I reviewed her ultrasound and there is sludge. There may be small stones but this is not clear to me looking at the study. Recommend HIDA to further evaluate gallbladder. Once this is done we can decide if cholecystectomy is of benefit to the patient. Discussed with patient and husband. May need upper endoscopy to exclude other etiologies for pain.       Gini Caputo A. 07/04/2012, 11:44 AM

## 2012-07-04 NOTE — Patient Instructions (Signed)
Will check HIDA study to further evaluate gallbladder.  Return after test if surgery indicated.

## 2012-07-05 ENCOUNTER — Other Ambulatory Visit (INDEPENDENT_AMBULATORY_CARE_PROVIDER_SITE_OTHER): Payer: Self-pay | Admitting: Surgery

## 2012-07-05 ENCOUNTER — Telehealth (INDEPENDENT_AMBULATORY_CARE_PROVIDER_SITE_OTHER): Payer: Self-pay

## 2012-07-05 DIAGNOSIS — R1013 Epigastric pain: Secondary | ICD-10-CM

## 2012-07-05 NOTE — Telephone Encounter (Signed)
Pt notified HIDA scheduled at [facility] 07/12/12 w/ 6:45am arrival.  Will get confirmation from Dr. Luisa Hart on when or if to schedule upper endoscopy.

## 2012-07-12 ENCOUNTER — Encounter (HOSPITAL_COMMUNITY)
Admission: RE | Admit: 2012-07-12 | Discharge: 2012-07-12 | Disposition: A | Discharge status: Home / Self Care | Payer: Medicare Other | Source: Ambulatory Visit | Attending: Surgery | Admitting: Surgery

## 2012-07-12 DIAGNOSIS — R1013 Epigastric pain: Secondary | ICD-10-CM | POA: Insufficient documentation

## 2012-07-12 MED ORDER — TECHNETIUM TC 99M MEBROFENIN IV KIT
5.0000 | PACK | Freq: Once | INTRAVENOUS | Status: AC | PRN
  Administered 2012-07-12: 5 via INTRAVENOUS

## 2012-07-12 MED ORDER — SINCALIDE 5 MCG IJ SOLR
INTRAMUSCULAR | Status: AC
  Administered 2012-07-12: 0.85 ug via INTRAVENOUS
  Filled 2012-07-12: qty 5

## 2012-07-12 MED ORDER — SINCALIDE 5 MCG IJ SOLR: 0.0200 ug/kg | Freq: Once | INTRAMUSCULAR | Status: AC

## 2012-07-18 ENCOUNTER — Telehealth (INDEPENDENT_AMBULATORY_CARE_PROVIDER_SITE_OTHER): Payer: Self-pay

## 2012-07-18 ENCOUNTER — Other Ambulatory Visit (INDEPENDENT_AMBULATORY_CARE_PROVIDER_SITE_OTHER): Payer: Self-pay | Admitting: Surgery

## 2012-07-18 ENCOUNTER — Encounter (INDEPENDENT_AMBULATORY_CARE_PROVIDER_SITE_OTHER): Payer: Self-pay

## 2012-07-18 NOTE — Telephone Encounter (Signed)
Called patient and let her know hida came back with ejection fracture of 0.8%. Told her she will need her gallbladder out. I let her know we have sent cardiac clearance request to Dr Swaziland and once I receive it we can get her scheduled.

## 2012-07-18 NOTE — Telephone Encounter (Signed)
Message copied by Brennan Bailey on Mon Jul 18, 2012 12:47 PM ------      Message from: Larry Sierras      Created: Mon Jul 18, 2012  9:57 AM      Regarding: TESTS RESULTS      Contact: [phone]       [phone] (SP) CALL PT REQ TESTS RESULTS FROM SCAN?/07-11-12/TC  PT/CALL/GM ------

## 2012-07-18 NOTE — Progress Notes (Signed)
HIDA shows low EF and known gallstone disease.  Will benefit from lap chole IOC. Will need cardiac clearance.  The procedure has been discussed with the patient. Operative and non operative treatments have been discussed. Risks of surgery include bleeding, infection,  Common bile duct injury,  Injury to the stomach,liver, colon,small intestine, abdominal wall,  Diaphragm,  Major blood vessels,  And the need for an open procedure.  Other risks include worsening of medical problems, death,  DVT and pulmonary embolism, and cardiovascular events.   Medical options have also been discussed. The patient has been informed of long term expectations of surgery and non surgical options,  The patient agrees to proceed.

## 2012-07-20 ENCOUNTER — Telehealth: Payer: Self-pay

## 2012-07-20 ENCOUNTER — Encounter: Payer: Self-pay | Admitting: Family Medicine

## 2012-07-20 NOTE — Telephone Encounter (Signed)
Received a fax surgical clearance note from [facility].Spoke to Dr.Jordan he advised ok for surgery.Advised to continue aspirin,may hold plavix 5 days prior to surgery.Faxed to [facility] at fax # [phone].

## 2012-07-25 ENCOUNTER — Other Ambulatory Visit: Payer: Self-pay | Admitting: Family Medicine

## 2012-07-25 ENCOUNTER — Telehealth (INDEPENDENT_AMBULATORY_CARE_PROVIDER_SITE_OTHER): Payer: Self-pay

## 2012-07-25 DIAGNOSIS — R1013 Epigastric pain: Secondary | ICD-10-CM

## 2012-07-25 NOTE — Telephone Encounter (Signed)
This encounter was created in error - please disregard.

## 2012-07-25 NOTE — Telephone Encounter (Signed)
Message copied by Brennan Bailey on Mon Jul 25, 2012 10:29 AM ------      Message from: Marin Shutter      Created: Mon Jul 25, 2012 10:16 AM      Regarding: Dr. Luisa Hart       Pt called and wanted to let you know that she did not want to go through sx right now.  Thx ------

## 2012-07-25 NOTE — Telephone Encounter (Signed)
Please see msg below

## 2012-08-01 ENCOUNTER — Encounter (INDEPENDENT_AMBULATORY_CARE_PROVIDER_SITE_OTHER): Payer: Self-pay | Admitting: Surgery

## 2012-08-01 ENCOUNTER — Ambulatory Visit (INDEPENDENT_AMBULATORY_CARE_PROVIDER_SITE_OTHER): Payer: Medicare Other | Admitting: Surgery

## 2012-08-01 ENCOUNTER — Encounter (INDEPENDENT_AMBULATORY_CARE_PROVIDER_SITE_OTHER): Payer: Self-pay

## 2012-08-01 VITALS — BP 130/80 | HR 70 | Temp 97.7°F | Ht <= 58 in | Wt 94.6 lb

## 2012-08-01 DIAGNOSIS — K8010 Calculus of gallbladder with chronic cholecystitis without obstruction: Secondary | ICD-10-CM | POA: Insufficient documentation

## 2012-08-01 NOTE — Patient Instructions (Addendum)
Stop your plavix 5 days before surgery Continue your aspirin through surgery.

## 2012-08-01 NOTE — Progress Notes (Signed)
Patient ID: Pamela Kidd, female   DOB: 11/03/1945, 67 y.o.   MRN: [id number]   PCP - [facility] - Dr. Jordan  HPI  Pamela Kidd is a 67 y.o. female. Patient sent at the request of Dr. [facility] for epigastric pain. The pain is dull in nature, constant located in her epigastrium and RUQ and intermittent. Eating does not make it better or worse. No nausea or vomiting. No radiation of pain. It never completely goes away and when it flares up the pain is not severe. 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  She does admit to a lot of abdominal bloating, flatulence, and belching, which is new for her.     HPI  Past Medical History   Diagnosis  Date   .  CAD (coronary artery disease)      a. 08/26/2011 Acute Ant STEMI/Cath/PCI: LM nl, LAD 100p -> 2.75x23 Xience Xpedition, LCX 20, minor irregs, RCA 20-30mid, EF 30-35%, anterior/apical AK; b. 05/2012 Cath: LM nl, LAD patent LAD stent, LCX 30ost, RCA dom, nl, EF 65%.   .  Ischemic cardiomyopathy      a. 08/27/2011 Echo: EF 35-40%, mid-dist anteroseptal/apical akinesis, Gr 1 DD, mild-mod TR, PASP 33mmHg   .  Hypotension      a. preventing use of ACEI   .  Diverticulosis    .  Hyperlipidemia    .  STEMI (ST elevation myocardial infarction)  08/2011     anterior/notes 09/07/2011 (06/08/2012)   .  Anginal pain  08/28/2011; 06/07/2012     "woke up twice last night w/ chest pain" (06/08/2012)   .  Asthmatic bronchitis      "as a child; haven't had any since ~ I was 12 yr old" (06/08/2012)   .  Shortness of breath      "mostly when I'm just sitting; related to heart pain" (06/08/2012)   .  Anemia      "I've always been anemic; never had a transfusion" (06/08/2012)   .  H/O hiatal hernia    .  Oral cancer  2003     a. s/p radiation/chemotherapy   .  Emphysema of lung     Past Surgical History   Procedure  Laterality  Date   .  Bunionectomy   Bilateral  1970's   .  Shoulder open rotator cuff repair  Left  1990's   .  Tonsillectomy   ~ 1951   .  Vaginal hysterectomy   ~ 1989     partial   .  Tubal ligation   1975   .  Coronary angioplasty with stent placement   08/2011     "1" (06/08/2012)   .  Cardiac catheterization   06/09/2012     patent prox LAD stent, minor nonobstructive LAD disease, 30% ostial LCx, and otherwise widely patent cors; LVEF 65%   .  Rotator cuff repair     .  Foot arthrodesis, modified mcbride      Family History   Problem  Relation  Age of Onset   .  Dementia  Mother    .  Heart disease  Father    .  Cancer  Father      prostate   .  Heart disease  Paternal Grandfather    Social History  History   Substance Use Topics   .    Smoking status:  Never Smoker   .  Smokeless tobacco:  Never Used   .  Alcohol Use:  No    Allergies   Allergen  Reactions   .  Ampicillin  Itching   .  Codeine  Itching and Nausea And Vomiting   .  Nubain (Nalbuphine Hcl)  Other (See Comments)     "headache & disoriented"    Current Outpatient Prescriptions   Medication  Sig  Dispense  Refill   .  Acetaminophen (TYLENOL PO)  Take 1 capsule by mouth daily as needed (for headache).     .  aspirin 81 MG EC tablet  Take 81 mg by mouth at bedtime.     .  Cholecalciferol (VITAMIN D) 2000 UNITS CAPS  Take 1 capsule by mouth every morning.     .  clopidogrel (PLAVIX) 75 MG tablet  Take 75 mg by mouth at bedtime.     .  nitroGLYCERIN (NITROSTAT) 0.4 MG SL tablet  Place 1 tablet (0.4 mg total) under the tongue every 5 (five) minutes x 3 doses as needed for chest pain.  30 tablet  3   .  pantoprazole (PROTONIX) 20 MG tablet  Take 20 mg by mouth at bedtime.     .  polyethylene glycol (MIRALAX / GLYCOLAX) packet  Take 17 g by mouth as needed (for constipation).     .  rosuvastatin (CRESTOR) 20 MG tablet  Take 20 mg by mouth at bedtime.      No current facility-administered medications for this visit.   Review of Systems  Review of  Systems  Constitutional: Positive for fatigue.  HENT: Negative for facial swelling.  Eyes: Negative.  Respiratory: Negative.  Cardiovascular: Negative.  Gastrointestinal: Positive for abdominal pain.  Endocrine: Negative.  Genitourinary: Negative.  Musculoskeletal: Negative.  Skin: Negative.  Allergic/Immunologic: Negative.  Neurological: Negative.  Hematological: Negative.  Psychiatric/Behavioral: Negative.  Filed Vitals:   08/01/12 1122  BP: 130/80  Pulse: 70  Temp: 97.7 F (36.5 C)    Physical Exam  Physical Exam  Constitutional: She is oriented to person, place, and time. She appears well-developed and well-nourished.  HENT:  Head: Normocephalic.  Eyes: EOM are normal. Pupils are equal, round, and reactive to light.  Neck: Normal range of motion. Neck supple.  Cardiovascular: Normal rate.  Pulmonary/Chest: Effort normal and breath sounds normal.  Abdominal: Soft. Normal appearance. Mild RUQ tenderness. There is no rebound and negative Murphy's sign. No hernia.    Musculoskeletal: Normal range of motion.  Neurological: She is alert and oriented to person, place, and time.  Skin: Skin is warm and dry.  Psychiatric: She has a normal mood and affect. Her behavior is normal. Judgment and thought content normal.  Data Reviewed  RADIOLOGY REPORT*  Clinical Data: Epigastric abdominal pain  LIMITED ABDOMINAL ULTRASOUND - RIGHT UPPER QUADRANT  Comparison: 04/24/2011  Findings:  Gallbladder: Gallbladder contains a small amount of intraluminal  sludge and small mobile stones. Wall thickness is normal measuring  2.3 mm. No Murphy's sign.  Common bile duct: 6.5 mm diameter. Mildly prominent but no  biliary obstruction or dilatation.  Liver: Mildly heterogeneous but no focal abnormality or biliary  dilatation. Hepatic and portal veins are patent with normal  directional flow.  No upper abdominal free fluid.  IMPRESSION:  Small amount of gallbladder sludge and incidental  small gallstones.  No other acute finding.  Original Report Authenticated By: M. Shick, M.D.  RADIOLOGY REPORT*  Clinical Data: History of   abdominal pain and epigastric pain.  History of cholelithiasis.  NUCLEAR MEDICINE HEPATOBILIARY IMAGING WITH GALLBLADDER EF  Technique: Sequential images of the abdomen were obtained out to 60  minutes following intravenous administration of  radiopharmaceutical. After slow intravenous infusion of 0.85 uCg  Cholecystokinin, gallbladder ejection fraction was determined.  Radiopharmaceutical: 5.0 mCi Tc-99m Choletec  Comparison: Ultrasound 06/17/2012  Findings: There is prompt visualization of hepatic activity. There  is prompt visualization of the common bile duct. Subsequently  intestinal activity was identified.  The gallbladder began to visualize at 21 minutes.  During CCK infusion the gallbladder contracted 0.8%. This is  abnormally low. 30% or greater is normal range.  During CCK infusion the patient reported experiencing no symptoms.  IMPRESSION:  There is demonstration of patency of the common bile duct and the  cystic duct. There is no evidence of cholecystitis.  During CCK infusion the gallbladder contracted 0.8%. This is  abnormally low. 30% or greater is normal range.  During CCK infusion the patient reported experiencing no symptoms.  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 The surgical procedure has been discussed with the patient.  Potential risks, benefits, alternative treatments, and expected outcomes have been explained.  All of the patient's questions at this time have been answered.  The likelihood of reaching the patient's treatment goal is good.  The patient understand the proposed surgical procedure and wishes to proceed.     

## 2012-08-05 ENCOUNTER — Encounter (HOSPITAL_COMMUNITY): Payer: Self-pay | Admitting: Pharmacy Technician

## 2012-08-11 ENCOUNTER — Encounter (HOSPITAL_COMMUNITY): Payer: Self-pay

## 2012-08-11 ENCOUNTER — Telehealth (INDEPENDENT_AMBULATORY_CARE_PROVIDER_SITE_OTHER): Payer: Self-pay | Admitting: *Deleted

## 2012-08-11 ENCOUNTER — Encounter (HOSPITAL_COMMUNITY)
Admission: RE | Admit: 2012-08-11 | Discharge: 2012-08-11 | Disposition: A | Discharge status: Home / Self Care | Payer: Medicare Other | Source: Ambulatory Visit | Attending: Surgery | Admitting: Surgery

## 2012-08-11 DIAGNOSIS — K8010 Calculus of gallbladder with chronic cholecystitis without obstruction: Secondary | ICD-10-CM | POA: Insufficient documentation

## 2012-08-11 DIAGNOSIS — Z01812 Encounter for preprocedural laboratory examination: Secondary | ICD-10-CM | POA: Insufficient documentation

## 2012-08-11 HISTORY — DX: Spontaneous ecchymoses: R23.3

## 2012-08-11 HISTORY — DX: Personal history of colonic polyps: Z86.010

## 2012-08-11 HISTORY — DX: Other complications of anesthesia, initial encounter: T88.59XA

## 2012-08-11 HISTORY — DX: Dizziness and giddiness: R42

## 2012-08-11 HISTORY — DX: Constipation, unspecified: K59.00

## 2012-08-11 HISTORY — DX: Other skin changes: R23.8

## 2012-08-11 HISTORY — DX: Adverse effect of unspecified anesthetic, initial encounter: T41.45XA

## 2012-08-11 HISTORY — DX: Dry mouth, unspecified: R68.2

## 2012-08-11 HISTORY — DX: Personal history of colon polyps, unspecified: Z86.0100

## 2012-08-11 LAB — CBC
MCHC: 34.5 g/dL (ref 30.0–36.0)
Platelets: 223 10*3/uL (ref 150–400)
RDW: 13.6 % (ref 11.5–15.5)
WBC: 6.3 10*3/uL (ref 4.0–10.5)

## 2012-08-11 LAB — BASIC METABOLIC PANEL
Calcium: 9.6 mg/dL (ref 8.4–10.5)
Chloride: 107 mEq/L (ref 96–112)
Creatinine, Ser: 1.18 mg/dL — ABNORMAL HIGH (ref 0.50–1.10)
GFR calc Af Amer: 54 mL/min — ABNORMAL LOW (ref >90)
GFR calc non Af Amer: 47 mL/min — ABNORMAL LOW (ref >90)

## 2012-08-11 MED ORDER — CHLORHEXIDINE GLUCONATE 4 % EX LIQD: 1.0000 "application " | Freq: Once | CUTANEOUS | Status: DC

## 2012-08-11 NOTE — Progress Notes (Signed)
Called and requested cardiac clearance note from [facility].

## 2012-08-11 NOTE — Progress Notes (Addendum)
Cardiologist is Dr.Peter Swaziland with [facility]-appointment is in Aug 2014--sees him every 3-4 months  Echo report in epic from 2013  Stress test report in epic from 2013  Heart cath reports in epic from 2013 and 2014  MEdical Md is Dr.Warren [facility]  EKG report in epic from 06-08-12  CXR report in epic from 05-20-12

## 2012-08-11 NOTE — Telephone Encounter (Signed)
This RN spoke to [place] CMA regarding the cardiac clearance who states when patient was transferred over to Dr. Corliss Skains they were using the telephone message from [facility] dated 07/20/12 as the cardiac clearance.  No letter is here in the office.  Lonia Chimera with Pre-Admit was notified of this at this time.

## 2012-08-11 NOTE — Pre-Procedure Instructions (Signed)
SANI LOISEAU  08/11/2012   Your procedure is scheduled on:  Thurs, July 31 @ 1:15 PM  Report to [facility] at 11:15 AM.  Call this number if you have problems the morning of surgery: [phone]   Remember:   Do not eat food or drink liquids after midnight.   Take these medicines the morning of surgery with A SIP OF WATER: Pantoprazole(Protonix)               Stop taking your Aspirin and Plavix 5 Days prior to surgery.No Goody's,BC's,Aleve,Ibuprofen,Fish Oil,or any Herbal Medications   Do not wear jewelry, make-up or nail polish.  Do not wear lotions, powders, or perfumes. You may wear deodorant.  Do not shave 48 hours prior to surgery.   Do not bring valuables to the hospital.  [facility] is not responsible                   for any belongings or valuables.  Contacts, dentures or bridgework may not be worn into surgery.  Leave suitcase in the car. After surgery it may be brought to your room.  For patients admitted to the hospital, checkout time is 11:00 AM the day of  discharge.   Patients discharged the day of surgery will not be allowed to drive  home.    Special Instructions: Shower using CHG 2 nights before surgery and the night before surgery.  If you shower the day of surgery use CHG.  Use special wash - you have one bottle of CHG for all showers.  You should use approximately 1/3 of the bottle for each shower.   Please read over the following fact sheets that you were given: Pain Booklet, Coughing and Deep Breathing and Surgical Site Infection Prevention

## 2012-08-11 NOTE — Progress Notes (Signed)
Spoke with pt and notified her that Dr.Jordan wants her to continue her baby Aspirin but stop Plavix 5days prior to surgery

## 2012-08-11 NOTE — Progress Notes (Signed)
Hasn't had to take Nitroglycerin in the last 10months

## 2012-08-17 MED ORDER — CIPROFLOXACIN IN D5W 400 MG/200ML IV SOLN
400.0000 mg | INTRAVENOUS | Status: AC
  Administered 2012-08-18: 400 mg via INTRAVENOUS
  Filled 2012-08-17: qty 200

## 2012-08-18 ENCOUNTER — Observation Stay (HOSPITAL_COMMUNITY)
Admission: RE | Admit: 2012-08-18 | Discharge: 2012-08-19 | Disposition: A | Discharge status: Home / Self Care | Payer: Medicare Other | Source: Ambulatory Visit | Attending: Surgery | Admitting: Surgery

## 2012-08-18 ENCOUNTER — Ambulatory Visit (HOSPITAL_COMMUNITY): Payer: Medicare Other

## 2012-08-18 ENCOUNTER — Encounter (HOSPITAL_COMMUNITY): Payer: Self-pay | Admitting: Anesthesiology

## 2012-08-18 ENCOUNTER — Encounter (HOSPITAL_COMMUNITY)
Admission: RE | Disposition: A | Discharge status: Home / Self Care | Payer: Self-pay | Source: Ambulatory Visit | Attending: Surgery

## 2012-08-18 ENCOUNTER — Ambulatory Visit (HOSPITAL_COMMUNITY): Payer: Medicare Other | Admitting: Anesthesiology

## 2012-08-18 ENCOUNTER — Encounter (HOSPITAL_COMMUNITY): Payer: Self-pay | Admitting: *Deleted

## 2012-08-18 DIAGNOSIS — K8010 Calculus of gallbladder with chronic cholecystitis without obstruction: Principal | ICD-10-CM | POA: Insufficient documentation

## 2012-08-18 DIAGNOSIS — R1013 Epigastric pain: Secondary | ICD-10-CM | POA: Insufficient documentation

## 2012-08-18 HISTORY — DX: Other specified postprocedural states: Z98.890

## 2012-08-18 HISTORY — PX: CHOLECYSTECTOMY: SHX55

## 2012-08-18 HISTORY — DX: Other specified postprocedural states: R11.2

## 2012-08-18 LAB — CREATININE, SERUM
Creatinine, Ser: 0.95 mg/dL (ref 0.50–1.10)
GFR calc non Af Amer: 61 mL/min — ABNORMAL LOW (ref >90)

## 2012-08-18 LAB — CBC
MCH: 29.3 pg (ref 26.0–34.0)
MCHC: 33.2 g/dL (ref 30.0–36.0)
RDW: 13.6 % (ref 11.5–15.5)

## 2012-08-18 SURGERY — LAPAROSCOPIC CHOLECYSTECTOMY WITH INTRAOPERATIVE CHOLANGIOGRAM
Anesthesia: General | Site: Abdomen | Wound class: Clean Contaminated

## 2012-08-18 MED ORDER — PANTOPRAZOLE SODIUM 20 MG PO TBEC
20.0000 mg | DELAYED_RELEASE_TABLET | Freq: Every day | ORAL | Status: DC
  Administered 2012-08-18: 20 mg via ORAL
  Filled 2012-08-18 (×3): qty 1

## 2012-08-18 MED ORDER — PROPOFOL 10 MG/ML IV BOLUS
INTRAVENOUS | Status: DC | PRN
  Administered 2012-08-18: 110 mg via INTRAVENOUS

## 2012-08-18 MED ORDER — LIDOCAINE HCL (CARDIAC) 20 MG/ML IV SOLN
INTRAVENOUS | Status: DC | PRN
  Administered 2012-08-18: 50 mg via INTRAVENOUS

## 2012-08-18 MED ORDER — FENTANYL CITRATE 0.05 MG/ML IJ SOLN
INTRAMUSCULAR | Status: DC | PRN
  Administered 2012-08-18 (×4): 50 ug via INTRAVENOUS

## 2012-08-18 MED ORDER — LACTATED RINGERS IV SOLN
INTRAVENOUS | Status: DC | PRN
  Administered 2012-08-18: 13:00:00 via INTRAVENOUS

## 2012-08-18 MED ORDER — SODIUM CHLORIDE 0.9 % IR SOLN
Status: DC | PRN
  Administered 2012-08-18: 1000 mL

## 2012-08-18 MED ORDER — MORPHINE SULFATE 2 MG/ML IJ SOLN
2.0000 mg | INTRAMUSCULAR | Status: DC | PRN
  Administered 2012-08-18 (×2): 4 mg via INTRAVENOUS
  Filled 2012-08-18 (×2): qty 2

## 2012-08-18 MED ORDER — ONDANSETRON HCL 4 MG/2ML IJ SOLN: 4.0000 mg | Freq: Four times a day (QID) | INTRAMUSCULAR | Status: DC | PRN

## 2012-08-18 MED ORDER — ARTIFICIAL TEARS OP OINT
TOPICAL_OINTMENT | OPHTHALMIC | Status: DC | PRN
  Administered 2012-08-18: 1 via OPHTHALMIC

## 2012-08-18 MED ORDER — BUPIVACAINE-EPINEPHRINE 0.25% -1:200000 IJ SOLN
INTRAMUSCULAR | Status: DC | PRN
  Administered 2012-08-18: 12 mL

## 2012-08-18 MED ORDER — BUPIVACAINE-EPINEPHRINE PF 0.25-1:200000 % IJ SOLN
INTRAMUSCULAR | Status: AC
  Filled 2012-08-18: qty 30

## 2012-08-18 MED ORDER — ROCURONIUM BROMIDE 100 MG/10ML IV SOLN
INTRAVENOUS | Status: DC | PRN
  Administered 2012-08-18: 30 mg via INTRAVENOUS

## 2012-08-18 MED ORDER — OXYCODONE-ACETAMINOPHEN 5-325 MG PO TABS: 1.0000 | ORAL_TABLET | ORAL | Status: DC | PRN

## 2012-08-18 MED ORDER — ONDANSETRON HCL 4 MG/2ML IJ SOLN: 4.0000 mg | Freq: Once | INTRAMUSCULAR | Status: DC | PRN

## 2012-08-18 MED ORDER — LACTATED RINGERS IV SOLN
INTRAVENOUS | Status: DC
  Administered 2012-08-18: 12:00:00 via INTRAVENOUS

## 2012-08-18 MED ORDER — ATORVASTATIN CALCIUM 40 MG PO TABS
40.0000 mg | ORAL_TABLET | Freq: Every day | ORAL | Status: DC
  Filled 2012-08-18 (×2): qty 1

## 2012-08-18 MED ORDER — ACETAMINOPHEN 325 MG PO TABS: 650.0000 mg | ORAL_TABLET | ORAL | Status: DC | PRN

## 2012-08-18 MED ORDER — HYDROMORPHONE HCL PF 1 MG/ML IJ SOLN: 0.2500 mg | INTRAMUSCULAR | Status: DC | PRN

## 2012-08-18 MED ORDER — CLOPIDOGREL BISULFATE 75 MG PO TABS
75.0000 mg | ORAL_TABLET | Freq: Every day | ORAL | Status: DC
  Administered 2012-08-18: 75 mg via ORAL
  Filled 2012-08-18 (×2): qty 1

## 2012-08-18 MED ORDER — NITROGLYCERIN 0.4 MG SL SUBL: 0.4000 mg | SUBLINGUAL_TABLET | SUBLINGUAL | Status: DC | PRN

## 2012-08-18 MED ORDER — ENOXAPARIN SODIUM 40 MG/0.4ML ~~LOC~~ SOLN
40.0000 mg | SUBCUTANEOUS | Status: DC
  Administered 2012-08-19: 40 mg via SUBCUTANEOUS
  Filled 2012-08-18: qty 0.4

## 2012-08-18 MED ORDER — SODIUM CHLORIDE 0.9 % IV SOLN
INTRAVENOUS | Status: DC | PRN
  Administered 2012-08-18: 14:00:00

## 2012-08-18 MED ORDER — ONDANSETRON HCL 4 MG PO TABS: 4.0000 mg | ORAL_TABLET | Freq: Four times a day (QID) | ORAL | Status: DC | PRN

## 2012-08-18 MED ORDER — PROPOFOL INFUSION 10 MG/ML OPTIME
INTRAVENOUS | Status: DC | PRN
  Administered 2012-08-18: 50 ug/kg/min via INTRAVENOUS
  Administered 2012-08-18: 15:00:00

## 2012-08-18 MED ORDER — 0.9 % SODIUM CHLORIDE (POUR BTL) OPTIME
TOPICAL | Status: DC | PRN
  Administered 2012-08-18: 1000 mL

## 2012-08-18 MED ORDER — POTASSIUM CHLORIDE IN NACL 20-0.9 MEQ/L-% IV SOLN
INTRAVENOUS | Status: DC
  Administered 2012-08-19: 01:00:00 via INTRAVENOUS
  Filled 2012-08-18 (×3): qty 1000

## 2012-08-18 MED ORDER — KETOROLAC TROMETHAMINE 30 MG/ML IJ SOLN: 15.0000 mg | Freq: Once | INTRAMUSCULAR | Status: DC | PRN

## 2012-08-18 MED ORDER — EPHEDRINE SULFATE 50 MG/ML IJ SOLN
INTRAMUSCULAR | Status: DC | PRN
  Administered 2012-08-18: 10 mg via INTRAVENOUS
  Administered 2012-08-18: 15 mg via INTRAVENOUS

## 2012-08-18 MED ORDER — ONDANSETRON HCL 4 MG/2ML IJ SOLN
INTRAMUSCULAR | Status: DC | PRN
  Administered 2012-08-18: 4 mg via INTRAVENOUS

## 2012-08-18 MED ORDER — SUCCINYLCHOLINE CHLORIDE 20 MG/ML IJ SOLN
INTRAMUSCULAR | Status: DC | PRN
  Administered 2012-08-18: 80 mg via INTRAVENOUS

## 2012-08-18 SURGICAL SUPPLY — 47 items
APL SKNCLS STERI-STRIP NONHPOA (GAUZE/BANDAGES/DRESSINGS) ×1
APPLIER CLIP ROT 10 11.4 M/L (STAPLE) ×2
APR CLP MED LRG 11.4X10 (STAPLE) ×1
BAG SPEC RTRVL LRG 6X4 10 (ENDOMECHANICALS) ×1
BENZOIN TINCTURE PRP APPL 2/3 (GAUZE/BANDAGES/DRESSINGS) ×2 IMPLANT
BLADE SURG ROTATE 9660 (MISCELLANEOUS) IMPLANT
CANISTER SUCTION 2500CC (MISCELLANEOUS) ×2 IMPLANT
CHLORAPREP W/TINT 26ML (MISCELLANEOUS) ×2 IMPLANT
CLIP APPLIE ROT 10 11.4 M/L (STAPLE) ×1 IMPLANT
CLOTH BEACON ORANGE TIMEOUT ST (SAFETY) ×2 IMPLANT
COVER MAYO STAND STRL (DRAPES) ×2 IMPLANT
COVER SURGICAL LIGHT HANDLE (MISCELLANEOUS) ×2 IMPLANT
DECANTER SPIKE VIAL GLASS SM (MISCELLANEOUS) ×4 IMPLANT
DRAPE C-ARM 42X72 X-RAY (DRAPES) ×2 IMPLANT
DRAPE UTILITY 15X26 W/TAPE STR (DRAPE) ×4 IMPLANT
DRSG TEGADERM 4X4.75 (GAUZE/BANDAGES/DRESSINGS) ×2 IMPLANT
ELECT REM PT RETURN 9FT ADLT (ELECTROSURGICAL) ×2
ELECTRODE REM PT RTRN 9FT ADLT (ELECTROSURGICAL) ×1 IMPLANT
FILTER SMOKE EVAC LAPAROSHD (FILTER) ×2 IMPLANT
GAUZE SPONGE 2X2 8PLY STRL LF (GAUZE/BANDAGES/DRESSINGS) ×1 IMPLANT
GLOVE BIO SURGEON STRL SZ7 (GLOVE) ×2 IMPLANT
GLOVE BIO SURGEON STRL SZ7.5 (GLOVE) ×1 IMPLANT
GLOVE BIOGEL PI IND STRL 7.0 (GLOVE) IMPLANT
GLOVE BIOGEL PI IND STRL 7.5 (GLOVE) ×1 IMPLANT
GLOVE BIOGEL PI INDICATOR 7.0 (GLOVE) ×2
GLOVE BIOGEL PI INDICATOR 7.5 (GLOVE) ×2
GLOVE SURG SS PI 7.0 STRL IVOR (GLOVE) ×1 IMPLANT
GOWN STRL NON-REIN LRG LVL3 (GOWN DISPOSABLE) ×8 IMPLANT
KIT BASIN OR (CUSTOM PROCEDURE TRAY) ×2 IMPLANT
KIT ROOM TURNOVER OR (KITS) ×2 IMPLANT
NS IRRIG 1000ML POUR BTL (IV SOLUTION) ×2 IMPLANT
PAD ARMBOARD 7.5X6 YLW CONV (MISCELLANEOUS) ×2 IMPLANT
POUCH SPECIMEN RETRIEVAL 10MM (ENDOMECHANICALS) ×2 IMPLANT
SCISSORS LAP 5X35 DISP (ENDOMECHANICALS) IMPLANT
SET CHOLANGIOGRAPH 5 50 .035 (SET/KITS/TRAYS/PACK) ×2 IMPLANT
SET IRRIG TUBING LAPAROSCOPIC (IRRIGATION / IRRIGATOR) ×2 IMPLANT
SLEEVE ENDOPATH XCEL 5M (ENDOMECHANICALS) ×2 IMPLANT
SPECIMEN JAR SMALL (MISCELLANEOUS) ×2 IMPLANT
SPONGE GAUZE 2X2 STER 10/PKG (GAUZE/BANDAGES/DRESSINGS) ×1
SUT MNCRL AB 4-0 PS2 18 (SUTURE) ×2 IMPLANT
TOWEL OR 17X24 6PK STRL BLUE (TOWEL DISPOSABLE) ×2 IMPLANT
TOWEL OR 17X26 10 PK STRL BLUE (TOWEL DISPOSABLE) ×2 IMPLANT
TOWEL OR NON WOVEN STRL DISP B (DISPOSABLE) ×2 IMPLANT
TRAY LAPAROSCOPIC (CUSTOM PROCEDURE TRAY) ×2 IMPLANT
TROCAR XCEL BLUNT TIP 100MML (ENDOMECHANICALS) ×2 IMPLANT
TROCAR XCEL NON-BLD 11X100MML (ENDOMECHANICALS) ×2 IMPLANT
TROCAR XCEL NON-BLD 5MMX100MML (ENDOMECHANICALS) ×2 IMPLANT

## 2012-08-18 NOTE — OR Nursing (Signed)
Extubated post volumes 1069mls/o2 by simple mask

## 2012-08-18 NOTE — OR Nursing (Signed)
Reversal given by dr Kandee Keen stopped

## 2012-08-18 NOTE — Anesthesia Procedure Notes (Signed)
Procedure Name: Intubation Date/Time: 08/18/2012 1:13 PM Performed by: Gayla Medicus Pre-anesthesia Checklist: Patient identified, Timeout performed, Emergency Drugs available, Suction available and Patient being monitored Patient Re-evaluated:Patient Re-evaluated prior to inductionOxygen Delivery Method: Circle system utilized Preoxygenation: Pre-oxygenation with 100% oxygen Intubation Type: IV induction Ventilation: Mask ventilation without difficulty and Oral airway inserted - appropriate to patient size Laryngoscope Size: Mac and 3 Grade View: Grade III Tube type: Oral Tube size: 7.0 mm Number of attempts: 1 Airway Equipment and Method: Stylet and Video-laryngoscopy Placement Confirmation: ETT inserted through vocal cords under direct vision,  positive ETCO2 and breath sounds checked- equal and bilateral Secured at: 21 cm Tube secured with: Tape Dental Injury: Teeth and Oropharynx as per pre-operative assessment  Difficulty Due To: Difficulty was anticipated

## 2012-08-18 NOTE — Op Note (Signed)
Laparoscopic Cholecystectomy with IOC Procedure Note  Indications: This patient presents with symptomatic gallbladder disease and will undergo laparoscopic cholecystectomy.  Pre-operative Diagnosis: Calculus of gallbladder with other cholecystitis, without mention of obstruction  Post-operative Diagnosis: Same  Surgeon: Jamesa Tedrick K.   Assistants: none  Anesthesia: General endotracheal anesthesia  ASA Class: 3   Procedure Details  The patient was seen again in the Holding Room. The risks, benefits, complications, treatment options, and expected outcomes were discussed with the patient. The possibilities of reaction to medication, pulmonary aspiration, perforation of viscus, bleeding, recurrent infection, finding a normal gallbladder, the need for additional procedures, failure to diagnose a condition, the possible need to convert to an open procedure, and creating a complication requiring transfusion or operation were discussed with the patient. The likelihood of improving the patient's symptoms with return to their baseline status is good.  The patient and/or family concurred with the proposed plan, giving informed consent. The site of surgery properly noted. The patient was taken to Operating Room, identified as Pamela Kidd and the procedure verified as Laparoscopic Cholecystectomy with Intraoperative Cholangiogram. A Time Out was held and the above information confirmed.  Prior to the induction of general anesthesia, antibiotic prophylaxis was administered. General endotracheal anesthesia was then administered and tolerated well. After the induction, the abdomen was prepped with Chloraprep and draped in the sterile fashion. The patient was positioned in the supine position.  Local anesthetic agent was injected into the skin below the umbilicus and an incision made. We dissected down to the abdominal fascia with blunt dissection.  The fascia was incised vertically and we entered the  peritoneal cavity bluntly.  A pursestring suture of 0-Vicryl was placed around the fascial opening.  The Hasson cannula was inserted and secured with the stay suture.  Pneumoperitoneum was then created with CO2 and tolerated well without any adverse changes in the patient's vital signs. An 11-mm port was placed in the subxiphoid position.  Two 5-mm ports were placed in the right upper quadrant. All skin incisions were infiltrated with a local anesthetic agent before making the incision and placing the trocars.   We positioned the patient in reverse Trendelenburg, tilted slightly to the patient's left.  The gallbladder was identified, the fundus grasped and retracted cephalad.  There were adhesions to the fundus of the gallbladder. Adhesions were lysed bluntly and with the electrocautery where indicated, taking care not to injure any adjacent organs or viscus. The infundibulum was grasped and retracted laterally, exposing the peritoneum overlying the triangle of Calot. This was then divided and exposed in a blunt fashion. A critical view of the cystic duct and cystic artery was obtained.  The cystic duct was clearly identified and bluntly dissected circumferentially. The cystic duct was ligated with a clip distally.   An incision was made in the cystic duct and the [facility] cholangiogram catheter introduced. The catheter was secured using a clip. A cholangiogram was then obtained which showed good visualization of the distal and proximal biliary tree with no sign of filling defects or obstruction.  Contrast flowed easily into the duodenum. The catheter was then removed.   The cystic duct was then ligated with clips and divided. The cystic artery was identified, dissected free, ligated with clips and divided as well.   The gallbladder was dissected from the liver bed in retrograde fashion with the electrocautery. The gallbladder was removed and placed in an Endocatch sac. The liver bed was irrigated and inspected.  Hemostasis was achieved with the  electrocautery. Copious irrigation was utilized and was repeatedly aspirated until clear.  The gallbladder and Endocatch sac were then removed through the umbilical port site.  The pursestring suture was used to close the umbilical fascia.    We again inspected the right upper quadrant for hemostasis.  Pneumoperitoneum was released as we removed the trocars.  4-0 Monocryl was used to close the skin.   Benzoin, steri-strips, and clean dressings were applied. The patient was then extubated and brought to the recovery room in stable condition. Instrument, sponge, and needle counts were correct at closure and at the conclusion of the case.   Findings: Cholecystitis with Cholelithiasis  Estimated Blood Loss: Minimal         Drains: none         Specimens: Gallbladder           Complications: None; patient tolerated the procedure well.         Disposition: PACU - hemodynamically stable.         Condition: stable  Wilmon Arms. Corliss Skains, MD, [facility] Surgery  General/ Trauma Surgery  08/18/2012 2:18 PM

## 2012-08-18 NOTE — OR Nursing (Signed)
Per dr Noreene Larsson she has "barely four" twiches and "we'll try again in "

## 2012-08-18 NOTE — Anesthesia Preprocedure Evaluation (Addendum)
Anesthesia Evaluation  Patient identified by MRN, date of birth, ID band Patient awake    Reviewed: Allergy & Precautions, H&P , NPO status , Patient's Chart, lab work & pertinent test results  Airway Mallampati: II TM Distance: >3 FB Neck ROM: Full  Mouth opening: Limited Mouth Opening  Dental  (+) Dental Advisory Given and Edentulous Lower   Pulmonary  breath sounds clear to auscultation        Cardiovascular Rhythm:Regular Rate:Normal     Neuro/Psych    GI/Hepatic   Endo/Other    Renal/GU      Musculoskeletal   Abdominal   Peds  Hematology   Anesthesia Other Findings   Reproductive/Obstetrics                          Anesthesia Physical Anesthesia Plan  ASA: III  Anesthesia Plan: General   Post-op Pain Management:    Induction: Intravenous  Airway Management Planned: Oral ETT  Additional Equipment:   Intra-op Plan:   Post-operative Plan: Extubation in OR  Informed Consent: I have reviewed the patients History and Physical, chart, labs and discussed the procedure including the risks, benefits and alternatives for the proposed anesthesia with the patient or authorized representative who has indicated his/her understanding and acceptance.   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

## 2012-08-18 NOTE — Preoperative (Signed)
Beta Blockers   Reason not to administer Beta Blockers:Not Applicable 

## 2012-08-18 NOTE — OR Nursing (Signed)
Dr Noreene Larsson at bedside/ at has 2 1 twitch/ will retest in 20-30mins per dr Noreene Larsson

## 2012-08-18 NOTE — Transfer of Care (Signed)
Immediate Anesthesia Transfer of Care Note  Patient: Pamela Kidd  Procedure(s) Performed: Procedure(s): LAPAROSCOPIC CHOLECYSTECTOMY WITH INTRAOPERATIVE CHOLANGIOGRAM (N/A)  Patient Location: PACU  Anesthesia Type:General  Level of Consciousness: sedated and Patient remains intubated per anesthesia plan  Airway & Oxygen Therapy: Patient placed on Ventilator (see vital sign flow sheet for setting)  Post-op Assessment: Report given to PACU RN, Post -op Vital signs reviewed and stable and Post -op Vital signs reviewed and unstable, Anesthesiologist notified  Post vital signs: Reviewed and stable  Complications: prolonged neuromuscular blockade

## 2012-08-18 NOTE — Interval H&P Note (Signed)
History and Physical Interval Note:  08/18/2012 12:53 PM  Pamela Kidd  has presented today for surgery, with the diagnosis of Chronic calculus cholecystitis  The various methods of treatment have been discussed with the patient and family. After consideration of risks, benefits and other options for treatment, the patient has consented to  Procedure(s): LAPAROSCOPIC CHOLECYSTECTOMY WITH INTRAOPERATIVE CHOLANGIOGRAM (N/A) as a surgical intervention .  The patient's history has been reviewed, patient examined, no change in status, stable for surgery.  I have reviewed the patient's chart and labs.  Questions were answered to the patient's satisfaction.     [name] K.

## 2012-08-18 NOTE — H&P (View-Only) (Signed)
Patient ID: Pamela Kidd, female   DOB: 1945-11-29, 67 y.o.   MRN: [id number]   PCP - [facility] - Dr. Sw[place]  HPI  Pamela Kidd is a 67 y.o. female. Patient sent at the request of Dr. Tanya Nones for epigastric pain. The pain is dull in nature, constant located in her epigastrium and RUQ and intermittent. Eating does not make it better or worse. No nausea or vomiting. No radiation of pain. It never completely goes away and when it flares up the pain is not severe. 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  She does admit to a lot of abdominal bloating, flatulence, and belching, which is new for her.     HPI  Past Medical History   Diagnosis  Date   .  CAD (coronary artery disease)      a. 08/26/2011 Acute Ant STEMI/Cath/PCI: LM nl, LAD 100p -> 2.75x23 Xience Xpedition, LCX 20, minor irregs, RCA 20-32mid, EF 30-35%, anterior/apical AK; b. 05/2012 Cath: LM nl, LAD patent LAD stent, LCX 30ost, RCA dom, nl, EF 65%.   .  Ischemic cardiomyopathy      a. 08/27/2011 Echo: EF 35-40%, mid-dist anteroseptal/apical akinesis, Gr 1 DD, mild-mod TR, PASP   .  Hypotension      a. preventing use of ACEI   .  Diverticulosis    .  Hyperlipidemia    .  STEMI (ST elevation myocardial infarction)  08/2011     anterior/notes 09/07/2011 (06/08/2012)   .  Anginal pain  08/28/2011; 06/07/2012     "woke up twice last night w/ chest pain" (06/08/2012)   .  Asthmatic bronchitis      "as a child; haven't had any since ~ I was 65 yr old" (06/08/2012)   .  Shortness of breath      "mostly when I'm just sitting; related to heart pain" (06/08/2012)   .  Anemia      "I've always been anemic; never had a transfusion" (06/08/2012)   .  H/O hiatal hernia    .  Oral cancer  2003     a. s/p radiation/chemotherapy   .  Emphysema of lung     Past Surgical History   Procedure  Laterality  Date   .  Bunionectomy   Bilateral  1970's   .  Shoulder open rotator cuff repair  Left  1990's   .  Tonsillectomy   ~ 1951   .  Vaginal hysterectomy   ~ 1989     partial   .  Tubal ligation   1975   .  Coronary angioplasty with stent placement   08/2011     "1" (06/08/2012)   .  Cardiac catheterization   06/09/2012     patent prox LAD stent, minor nonobstructive LAD disease, 30% ostial LCx, and otherwise widely patent cors; LVEF 65%   .  Rotator cuff repair     .  Foot arthrodesis, modified mcbride      Family History   Problem  Relation  Age of Onset   .  Dementia  Mother    .  Heart disease  Father    .  Cancer  Father      prostate   .  Heart disease  Paternal Grandfather    Social History  History   Substance Use Topics   .  Smoking status:  Never Smoker   .  Smokeless tobacco:  Never Used   .  Alcohol Use:  No    Allergies   Allergen  Reactions   .  Ampicillin  Itching   .  Codeine  Itching and Nausea And Vomiting   .  Nubain (Nalbuphine Hcl)  Other (See Comments)     "headache & disoriented"    Current Outpatient Prescriptions   Medication  Sig  Dispense  Refill   .  Acetaminophen (TYLENOL PO)  Take 1 capsule by mouth daily as needed (for headache).     Marland Kitchen  aspirin 81 MG EC tablet  Take 81 mg by mouth at bedtime.     .  Cholecalciferol (VITAMIN D) 2000 UNITS CAPS  Take 1 capsule by mouth every morning.     .  clopidogrel (PLAVIX) 75 MG tablet  Take 75 mg by mouth at bedtime.     .  nitroGLYCERIN (NITROSTAT) 0.4 MG SL tablet  Place 1 tablet (0.4 mg total) under the tongue every 5 (five) minutes x 3 doses as needed for chest pain.  30 tablet  3   .  pantoprazole (PROTONIX) 20 MG tablet  Take 20 mg by mouth at bedtime.     .  polyethylene glycol (MIRALAX / GLYCOLAX) packet  Take 17 g by mouth as needed (for constipation).     .  rosuvastatin (CRESTOR) 20 MG tablet  Take 20 mg by mouth at bedtime.      No current facility-administered medications for this visit.   Review of Systems  Review of  Systems  Constitutional: Positive for fatigue.  HENT: Negative for facial swelling.  Eyes: Negative.  Respiratory: Negative.  Cardiovascular: Negative.  Gastrointestinal: Positive for abdominal pain.  Endocrine: Negative.  Genitourinary: Negative.  Musculoskeletal: Negative.  Skin: Negative.  Allergic/Immunologic: Negative.  Neurological: Negative.  Hematological: Negative.  Psychiatric/Behavioral: Negative.  Filed Vitals:   08/01/12 1122  BP: 130/80  Pulse: 70  Temp: 97.7 F (36.5 C)    Physical Exam  Physical Exam  Constitutional: She is oriented to person, place, and time. She appears well-developed and well-nourished.  HENT:  Head: Normocephalic.  Eyes: EOM are normal. Pupils are equal, round, and reactive to light.  Neck: Normal range of motion. Neck supple.  Cardiovascular: Normal rate.  Pulmonary/Chest: Effort normal and breath sounds normal.  Abdominal: Soft. Normal appearance. Mild RUQ tenderness. There is no rebound and negative Murphy's sign. No hernia.    Musculoskeletal: Normal range of motion.  Neurological: She is alert and oriented to person, place, and time.  Skin: Skin is warm and dry.  Psychiatric: She has a normal mood and affect. Her behavior is normal. Judgment and thought content normal.  Data Reviewed  RADIOLOGY REPORT*  Clinical Data: Epigastric abdominal pain  LIMITED ABDOMINAL ULTRASOUND - RIGHT UPPER QUADRANT  Comparison: 04/24/2011  Findings:  Gallbladder: Gallbladder contains a small amount of intraluminal  sludge and small mobile stones. Wall thickness is normal measuring  2.3 mm. No Murphy's sign.  Common bile duct: 6.5 mm diameter. Mildly prominent but no  biliary obstruction or dilatation.  Liver: Mildly heterogeneous but no focal abnormality or biliary  dilatation. Hepatic and portal veins are patent with normal  directional flow.  No upper abdominal free fluid.  IMPRESSION:  Small amount of gallbladder sludge and incidental  small gallstones.  No other acute finding.  Original Report Authenticated By: Judie Petit. Shick, M.D.  RADIOLOGY REPORT*  Clinical Data: History of  abdominal pain and epigastric pain.  History of cholelithiasis.  NUCLEAR MEDICINE HEPATOBILIARY IMAGING WITH GALLBLADDER EF  Technique: Sequential images of the abdomen were obtained out to 60  minutes following intravenous administration of  radiopharmaceutical. After slow intravenous infusion of 0.85 uCg  Cholecystokinin, gallbladder ejection fraction was determined.  Radiopharmaceutical: 5.0 mCi Tc-86m Choletec  Comparison: Ultrasound 06/17/2012  Findings: There is prompt visualization of hepatic activity. There  is prompt visualization of the common bile duct. Subsequently  intestinal activity was identified.  The gallbladder began to visualize at 21 minutes.  During CCK infusion the gallbladder contracted 0.8%. This is  abnormally low. 30% or greater is normal range.  During CCK infusion the patient reported experiencing no symptoms.  IMPRESSION:  There is demonstration of patency of the common bile duct and the  cystic duct. There is no evidence of cholecystitis.  During CCK infusion the gallbladder contracted 0.8%. This is  abnormally low. 30% or greater is normal range.  During CCK infusion the patient reported experiencing no symptoms.  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 The surgical procedure has been discussed with the patient.  Potential risks, benefits, alternative treatments, and expected outcomes have been explained.  All of the patient's questions at this time have been answered.  The likelihood of reaching the patient's treatment goal is good.  The patient understand the proposed surgical procedure and wishes to proceed.

## 2012-08-18 NOTE — Anesthesia Postprocedure Evaluation (Signed)
  Anesthesia Post-op Note  Patient: Pamela Kidd  Procedure(s) Performed: Procedure(s): LAPAROSCOPIC CHOLECYSTECTOMY WITH INTRAOPERATIVE CHOLANGIOGRAM (N/A)  Patient Location: PACU  Anesthesia Type:General  Level of Consciousness: awake, alert  and oriented  Airway and Oxygen Therapy: Patient Spontanous Breathing and Patient connected to nasal cannula oxygen  Post-op Pain: mild  Post-op Assessment: Post-op Vital signs reviewed, Patient's Cardiovascular Status Stable, Respiratory Function Stable, Patent Airway, No signs of Nausea or vomiting and Pain level controlled  Post-op Vital Signs: stable  Complications: No apparent anesthesia complications

## 2012-08-19 ENCOUNTER — Encounter (HOSPITAL_COMMUNITY): Payer: Self-pay | Admitting: Surgery

## 2012-08-19 MED ORDER — OXYCODONE-ACETAMINOPHEN 5-325 MG PO TABS: 1.0000 | ORAL_TABLET | ORAL | Status: DC | PRN

## 2012-08-19 NOTE — Progress Notes (Signed)
Patient discharged to home with instructions, verbalized understanding. 

## 2012-08-19 NOTE — Discharge Summary (Signed)
Physician Discharge Summary  Patient ID: Pamela Kidd MRN: [id number] DOB/AGE: 67/21/1947 67 y.o.  Admit date: 08/18/2012 Discharge date: 08/19/2012  Admission Diagnoses:  Chronic calculus cholecystitis    Ischemic cardiomyopathy    Coronary artery disease    Oral cancer Discharge Diagnoses: Same Active Problems:   * No active hospital problems. *   Discharged Condition: good  Hospital Course: Laparoscopic cholecystectomy with intraoperative cholangiogram on 08/18/12.  Kept for observation overnight due to cardiac history.  Did well.  Ready for discharge.  Consults: None  Significant Diagnostic Studies: none  Treatments: surgery: lap chole with IOC  Discharge Exam: Blood pressure 104/65, pulse 86, temperature 98.4 F (36.9 C), temperature source Oral, resp. rate 18, height 4\' 10"  (1.473 m), weight 93 lb 8 oz (42.411 kg), SpO2 98.00%. General appearance: alert, cooperative and no distress GI: soft, incisional tenderness Dressings c/d/i  Disposition: 01-Home or Self Care  Discharge Orders   Future Appointments Provider Department Dept Phone   09/01/2012 2:40 PM Wilmon Arms. Charmaine Placido, MD [facility], [place] [phone]   09/08/2012 1:45 PM Peter M Swazil[place], MD [facility] Main Office [place]) [phone]   11/17/2012 11:30 AM Peter M Swaz[place], MD [place] [facility] Main Office [place]) [phone]   Future Orders Complete By Expires     Call MD for:  persistant nausea and vomiting  As directed     Call MD for:  redness, tenderness, or signs of infection (pain, swelling, redness, odor or green/yellow discharge around incision site)  As directed     Call MD for:  severe uncontrolled pain  As directed     Call MD for:  temperature >100.4  As directed     Diet general  As directed     Driving Restrictions  As directed     Comments:      Do not drive while taking pain medications    Increase activity slowly  As directed     May shower / Bathe  As directed     May walk up  steps  As directed         Medication List         acetaminophen 325 MG tablet  Commonly known as:  TYLENOL  Take 325 mg by mouth every 6 (six) hours as needed for pain.     aspirin 81 MG EC tablet  Take 81 mg by mouth at bedtime.     clopidogrel 75 MG tablet  Commonly known as:  PLAVIX  Take 75 mg by mouth at bedtime.     nitroGLYCERIN 0.4 MG SL tablet  Commonly known as:  NITROSTAT  Place 1 tablet (0.4 mg total) under the tongue every 5 (five) minutes x 3 doses as needed for chest pain.     oxyCODONE-acetaminophen 5-325 MG per tablet  Commonly known as:  PERCOCET/ROXICET  Take 1-2 tablets by mouth every 4 (four) hours as needed.     pantoprazole 20 MG tablet  Commonly known as:  PROTONIX  Take 20 mg by mouth at bedtime.     polyethylene glycol packet  Commonly known as:  MIRALAX / GLYCOLAX  Take 17 g by mouth daily as needed (for constipation).     rosuvastatin 20 MG tablet  Commonly known as:  CRESTOR  Take 20 mg by mouth at bedtime.     Vitamin D 2000 UNITS Caps  Take 1 capsule by mouth every morning.           Follow-up Information  Follow up with Wynona Luna., MD. Schedule an appointment as soon as possible for a visit in 3 weeks.   Contact information:   [address] [place] [address] [phone]       Signed: Wynona Luna. 08/19/2012, 6:49 AM

## 2012-08-30 ENCOUNTER — Telehealth: Payer: Self-pay | Admitting: Family Medicine

## 2012-08-30 NOTE — Telephone Encounter (Signed)
No, I would not recommend testing her for MRSA unless she has an active infection.  You cannot be cured from being a carrier.  His treatment is only done around the time of surgery to reduce the chances of him infecting his own wound.

## 2012-08-30 NOTE — Telephone Encounter (Signed)
lmtrc

## 2012-08-30 NOTE — Telephone Encounter (Signed)
Pt (husband Pamela Kidd) having surgery.  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  Is being pretreated with antibiotics.  Is concerned if wife may have also, should she be tested and treated because of being around him post surgery?  Please advise

## 2012-08-30 NOTE — Telephone Encounter (Signed)
Pt aware of provider recommendations  

## 2012-09-01 ENCOUNTER — Encounter (INDEPENDENT_AMBULATORY_CARE_PROVIDER_SITE_OTHER): Payer: Self-pay | Admitting: Surgery

## 2012-09-01 ENCOUNTER — Ambulatory Visit (INDEPENDENT_AMBULATORY_CARE_PROVIDER_SITE_OTHER): Payer: Medicare Other | Admitting: Surgery

## 2012-09-01 VITALS — BP 102/64 | HR 70 | Resp 16 | Ht <= 58 in | Wt 95.6 lb

## 2012-09-01 DIAGNOSIS — K8010 Calculus of gallbladder with chronic cholecystitis without obstruction: Secondary | ICD-10-CM

## 2012-09-01 NOTE — Progress Notes (Signed)
Status post laparoscopic cholecystectomy with intraoperative collection also/31/14. Pathology showed chronic calculus cholecystitis. Patient doing quite well. Prior surgery she does have chronic constipation but this has improved. She has some soreness in her right upper quadrant at her port sites. This is improving. All of her incisions looked good without any sign of infection. She may resume full a to E. Followup as needed.  Wilmon Arms. Corliss Skains, MD, [facility] Surgery  General/ Trauma Surgery  09/01/2012 3:20 PM

## 2012-09-08 ENCOUNTER — Encounter: Payer: Self-pay | Admitting: Cardiology

## 2012-09-08 ENCOUNTER — Ambulatory Visit (INDEPENDENT_AMBULATORY_CARE_PROVIDER_SITE_OTHER): Payer: Medicare Other | Admitting: Cardiology

## 2012-09-08 VITALS — BP 110/72 | HR 71 | Ht <= 58 in | Wt 92.1 lb

## 2012-09-08 DIAGNOSIS — I2510 Atherosclerotic heart disease of native coronary artery without angina pectoris: Secondary | ICD-10-CM

## 2012-09-08 DIAGNOSIS — E7800 Pure hypercholesterolemia, unspecified: Secondary | ICD-10-CM

## 2012-09-08 MED ORDER — ASPIRIN EC 81 MG PO TBEC: 81.0000 mg | DELAYED_RELEASE_TABLET | Freq: Every day | ORAL | Status: DC

## 2012-09-08 MED ORDER — NITROGLYCERIN 0.4 MG SL SUBL: 0.4000 mg | SUBLINGUAL_TABLET | SUBLINGUAL | Status: DC | PRN

## 2012-09-08 NOTE — Patient Instructions (Signed)
Stop taking Plavix. Continue your other therapy  I will see you in 6 months.    

## 2012-09-08 NOTE — Progress Notes (Signed)
Pamela Kidd Date of Birth: 10-Dec-1945 Medical Record #[id number]  History of Present Illness:  [name] is seen back today for a follow up visit. She has known CAD with anterior MI in August of 2013 treated with DES of the proximal LAD. EF has recovered. Negative Myoview in October of 2013. Repeat cath 5/14 showed nonobstructive disease. Other issues include HLD and GERD. Has rate PVCs on past event monitor. She reports she underwent cholecystectomy about 4 weeks ago. Denies any current chest pain or SOB.   Current Outpatient Prescriptions on File Prior to Visit  Medication Sig Dispense Refill  . acetaminophen (TYLENOL) 325 MG tablet Take 325 mg by mouth every 6 (six) hours as needed for pain.      . Cholecalciferol (VITAMIN D) 2000 UNITS CAPS Take 1 capsule by mouth every morning.       . pantoprazole (PROTONIX) 20 MG tablet Take 20 mg by mouth at bedtime.      . polyethylene glycol (MIRALAX / GLYCOLAX) packet Take 17 g by mouth daily as needed (for constipation).       . rosuvastatin (CRESTOR) 20 MG tablet Take 20 mg by mouth at bedtime.       No current facility-administered medications on file prior to visit.    Allergies  Allergen Reactions  . Ampicillin Itching  . Codeine Itching and Nausea And Vomiting  . Nubain [Nalbuphine Hcl] Other (See Comments)    "headache & disoriented"      Past Medical History  Diagnosis Date  . Ischemic cardiomyopathy     a. 08/27/2011 Echo: EF 35-40%, mid-dist anteroseptal/apical akinesis, Gr 1 DD, mild-mod TR, PASP  . Hypotension     a. preventing use of ACEI  . Diverticulosis   . Hyperlipidemia     takes Crestor nightly  . Asthmatic bronchitis     "as a child; haven't had any since ~ I was 43 yr old" (06/08/2012)  . Anemia     "I've always been anemic; never had a transfusion" (06/08/2012)  . H/O hiatal hernia   . Oral cancer 2003    a. s/p radiation/chemotherapy  . Emphysema of lung   . GERD (gastroesophageal reflux disease)       takes Protonix daily  . Constipation     takes Miralax daily prn  . CAD (coronary artery disease)     a. 08/26/2011 Acute Ant STEMI/Cath/PCI: LM nl, LAD 100p -> 2.75x23 Xience Xpedition, LCX 20, minor irregs, RCA 20-11mid, EF 30-35%, anterior/apical AK;  b. 05/2012 Cath: LM nl, LAD patent LAD stent, LCX 30ost, RCA dom, nl, EF 65%.  . Complication of anesthesia     slow to wake up  . Vertigo     occasionally  . Bruises easily     d/t taking Plavix and ASA  . History of colon polyps   . Dry mouth   . PONV (postoperative nausea and vomiting)   . STEMI (ST elevation myocardial infarction) 08/2011    anterior/notes 09/07/2011 (06/08/2012)    Past Surgical History  Procedure Laterality Date  . Bunionectomy Bilateral 1970's  . Shoulder open rotator cuff repair Left 1990's  . Tonsillectomy  ~ 1951  . Vaginal hysterectomy  ~ 1989    partial  . Tubal ligation  1975  . Coronary angioplasty with stent placement  08/2011    "1" (06/08/2012)  . Cardiac catheterization  06/09/2012    patent prox LAD stent, minor nonobstructive LAD disease, 30% ostial LCx, and otherwise  widely patent cors; LVEF 65%  . Colonoscopy    . Peg placement      in 2003 and then removed  . Cholecystectomy  08/18/2012  . Cholecystectomy N/A 08/18/2012    Procedure: LAPAROSCOPIC CHOLECYSTECTOMY WITH INTRAOPERATIVE CHOLANGIOGRAM;  Surgeon: Wilmon Arms. Corliss Skains, MD;  Location: [facility] OR;  Service: General;  Laterality: N/A;    History  Smoking status  . Never Smoker   Smokeless tobacco  . Never Used    History  Alcohol Use No    Family History  Problem Relation Age of Onset  . Dementia Mother   . Heart disease Father   . Cancer Father     prostate  . Heart disease Paternal Grandfather     Review of Systems: The review of systems is per the HPI.  All other systems were reviewed and are negative.  Physical Exam: BP 110/72  Pulse 71  Ht 4\' 10"  (1.473 m)  Wt 92 lb 1.9 oz (41.785 kg)  BMI 19.26 kg/m2  SpO2  99% Patient is very pleasant and in no acute distress. Skin is warm and dry. Color is normal.  HEENT is unremarkable. Normocephalic/atraumatic. PERRL. Sclera are nonicteric. Neck is supple. No masses. No JVD. Lungs are clear. Cardiac exam shows a regular rate and rhythm. Abdomen is soft. Extremities are without edema. Gait and ROM are intact. No gross neurologic deficits noted.  LABORATORY DATA:  Lab Results  Component Value Date   WBC 14.5* 08/18/2012   HGB 12.5 08/18/2012   HCT 37.7 08/18/2012   PLT 200 08/18/2012   GLUCOSE 92 08/11/2012   CHOL 112 06/09/2012   TRIG 104 06/09/2012   HDL 56 06/09/2012   LDLCALC 35 06/09/2012   ALT 10 06/16/2012   AST 18 06/16/2012   NA 141 08/11/2012   K 4.1 08/11/2012   CL 107 08/11/2012   CREATININE 0.95 08/18/2012   BUN 17 08/11/2012   CO2 24 08/11/2012   TSH 3.279 06/08/2012   INR 1.03 06/08/2012   HGBA1C 5.8* 08/26/2011   Assessment / Plan: 1. CAD - s/p DES of LAD one year ago. Cath in May showed nonobstructive disease. OK to stop Plavix now. Continue ASA 81 mg. Follow up in 6 months.  2. Past anterior MI in August of 2013 treated with DES of the proximal LAD. EF has recovered per last echo.  3. HLD - excellent control on statin.

## 2012-10-21 ENCOUNTER — Encounter: Payer: Self-pay | Admitting: Family Medicine

## 2012-10-21 ENCOUNTER — Ambulatory Visit (INDEPENDENT_AMBULATORY_CARE_PROVIDER_SITE_OTHER): Payer: Medicare Other | Admitting: Family Medicine

## 2012-10-21 VITALS — BP 100/62 | HR 72 | Temp 98.4°F | Resp 16 | Ht <= 58 in | Wt 92.0 lb

## 2012-10-21 DIAGNOSIS — R109 Unspecified abdominal pain: Secondary | ICD-10-CM

## 2012-10-21 MED ORDER — LIDOCAINE 5 % EX PTCH: 1.0000 | MEDICATED_PATCH | CUTANEOUS | Status: DC

## 2012-10-21 NOTE — Progress Notes (Signed)
Subjective:    Patient ID: Pamela Kidd, female    DOB: 1945-02-03, 67 y.o.   MRN: [id number]  HPI  Patient underwent a cholecystectomy earlier this summer. However ever since her surgery which was performed laparoscopically she has had tenderness to palpation in the right upper quadrant. The skin itself is tender to palpation. There is no palpable masses in the area. There is no hernias in the right upper quadrant. There is no evidence of saline as an abscess or seroma. The patient denies nausea vomiting diarrhea constipation hematuria dysuria hematochezia or melena. She has no indigestion. The pain is reproducible on deep palpation. It seems to the surgical site pain. She has not tried any medication for it at the present time. Past Medical History  Diagnosis Date  . Ischemic cardiomyopathy     a. 08/27/2011 Echo: EF 35-40%, mid-dist anteroseptal/apical akinesis, Gr 1 DD, mild-mod TR, PASP  . Hypotension     a. preventing use of ACEI  . Diverticulosis   . Hyperlipidemia     takes Crestor nightly  . Asthmatic bronchitis     "as a child; haven't had any since ~ I was 66 yr old" (06/08/2012)  . Anemia     "I've always been anemic; never had a transfusion" (06/08/2012)  . H/O hiatal hernia   . Oral cancer 2003    a. s/p radiation/chemotherapy  . Emphysema of lung   . GERD (gastroesophageal reflux disease)     takes Protonix daily  . Constipation     takes Miralax daily prn  . CAD (coronary artery disease)     a. 08/26/2011 Acute Ant STEMI/Cath/PCI: LM nl, LAD 100p -> 2.75x23 Xience Xpedition, LCX 20, minor irregs, RCA 20-26mid, EF 30-35%, anterior/apical AK;  b. 05/2012 Cath: LM nl, LAD patent LAD stent, LCX 30ost, RCA dom, nl, EF 65%.  . Complication of anesthesia     slow to wake up  . Vertigo     occasionally  . Bruises easily     d/t taking Plavix and ASA  . History of colon polyps   . Dry mouth   . PONV (postoperative nausea and vomiting)   . STEMI (ST elevation myocardial  infarction) 08/2011    anterior/notes 09/07/2011 (06/08/2012)   Past Surgical History  Procedure Laterality Date  . Bunionectomy Bilateral 1970's  . Shoulder open rotator cuff repair Left 1990's  . Tonsillectomy  ~ 1951  . Vaginal hysterectomy  ~ 1989    partial  . Tubal ligation  1975  . Coronary angioplasty with stent placement  08/2011    "1" (06/08/2012)  . Cardiac catheterization  06/09/2012    patent prox LAD stent, minor nonobstructive LAD disease, 30% ostial LCx, and otherwise widely patent cors; LVEF 65%  . Colonoscopy    . Peg placement      in 2003 and then removed  . Cholecystectomy  08/18/2012  . Cholecystectomy N/A 08/18/2012    Procedure: LAPAROSCOPIC CHOLECYSTECTOMY WITH INTRAOPERATIVE CHOLANGIOGRAM;  Surgeon: Wilmon Arms. Corliss Skains, MD;  Location: [facility] OR;  Service: General;  Laterality: N/A;   Current Outpatient Prescriptions on File Prior to Visit  Medication Sig Dispense Refill  . aspirin EC 81 MG tablet Take 1 tablet (81 mg total) by mouth daily.  90 tablet  3  . nitroGLYCERIN (NITROSTAT) 0.4 MG SL tablet Place 1 tablet (0.4 mg total) under the tongue every 5 (five) minutes x 3 doses as needed for chest pain.  30 tablet  3  .  pantoprazole (PROTONIX) 20 MG tablet Take 20 mg by mouth at bedtime.      . rosuvastatin (CRESTOR) 20 MG tablet Take 20 mg by mouth at bedtime.      . Cholecalciferol (VITAMIN D) 2000 UNITS CAPS Take 1 capsule by mouth every morning.        No current facility-administered medications on file prior to visit.   Allergies  Allergen Reactions  . Ampicillin Itching  . Codeine Itching and Nausea And Vomiting  . Nubain [Nalbuphine Hcl] Other (See Comments)    "headache & disoriented"     History   Social History  . Marital Status: Married    Spouse Name: N/A    Number of Children: N/A  . Years of Education: N/A   Occupational History  . Not on file.   Social History Main Topics  . Smoking status: Never Smoker   . Smokeless tobacco: Never Used   . Alcohol Use: No  . Drug Use: No  . Sexual Activity: No   Other Topics Concern  . Not on file   Social History Narrative  . No narrative on file     Review of Systems  All other systems reviewed and are negative.       Objective:   Physical Exam  Vitals reviewed. Constitutional: She appears well-developed and well-nourished.  Cardiovascular: Normal rate, regular rhythm and normal heart sounds.   Pulmonary/Chest: Effort normal and breath sounds normal. No respiratory distress. She has no wheezes. She has no rales.  Abdominal: Soft. Bowel sounds are normal. She exhibits no distension and no mass. There is tenderness (tender to palpation in the right upper quadrant). There is no rebound and no guarding.          Assessment & Plan:  1. Abdominal  pain, other specified site I believe the patient likely has surgical site pain. I recommended Lidoderm patches. She can cut the patch to the appropriate size and apply it  to the affected area for 12 hours a day. I like to recheck the patient in 2 weeks to see if her symptoms are improving. If not I would consider gabapentin for neuropathic pain.

## 2012-11-03 ENCOUNTER — Telehealth: Payer: Self-pay | Admitting: Cardiology

## 2012-11-03 ENCOUNTER — Telehealth: Payer: Self-pay | Admitting: *Deleted

## 2012-11-03 MED ORDER — ROSUVASTATIN CALCIUM 20 MG PO TABS: 20.0000 mg | ORAL_TABLET | Freq: Every day | ORAL | Status: DC

## 2012-11-03 NOTE — Telephone Encounter (Signed)
Refill complete 

## 2012-11-03 NOTE — Telephone Encounter (Signed)
New message    Refill crestor

## 2012-11-17 ENCOUNTER — Ambulatory Visit (INDEPENDENT_AMBULATORY_CARE_PROVIDER_SITE_OTHER): Payer: Medicare Other | Admitting: Cardiology

## 2012-11-17 ENCOUNTER — Encounter: Payer: Self-pay | Admitting: Cardiology

## 2012-11-17 VITALS — BP 104/68 | HR 74 | Ht <= 58 in | Wt 92.0 lb

## 2012-11-17 DIAGNOSIS — I2589 Other forms of chronic ischemic heart disease: Secondary | ICD-10-CM

## 2012-11-17 DIAGNOSIS — E7800 Pure hypercholesterolemia, unspecified: Secondary | ICD-10-CM

## 2012-11-17 DIAGNOSIS — I2510 Atherosclerotic heart disease of native coronary artery without angina pectoris: Secondary | ICD-10-CM

## 2012-11-17 DIAGNOSIS — I255 Ischemic cardiomyopathy: Secondary | ICD-10-CM

## 2012-11-17 NOTE — Addendum Note (Signed)
Addended by: Meda Klinefelter D on: 11/17/2012 11:59 AM   Modules accepted: Orders

## 2012-11-17 NOTE — Patient Instructions (Signed)
Continue your current therapy  I will see you in 6 months with fasting lab work.   

## 2012-11-17 NOTE — Progress Notes (Signed)
Pamela Kidd Date of Birth: 1945/08/11 Medical Record #[id number]  History of Present Illness:  Pamela Kidd is seen back today for a follow up visit. She has known CAD with anterior MI in August of 2013 treated with DES of the proximal LAD. EF has recovered. Negative Myoview in October of 2013. Repeat cath 5/14 showed nonobstructive disease. Other issues include HLD and GERD. Has rare PVCs on past event monitor. She reports she had cholecystectomy earlier this year. Denies any current chest pain or SOB. The only thing she notes is occasional strange sensation in her chest that feels empty. If she takes deep breaths it goes away. This does not happen with exertion.   Current Outpatient Prescriptions on File Prior to Visit  Medication Sig Dispense Refill  . aspirin EC 81 MG tablet Take 1 tablet (81 mg total) by mouth daily.  90 tablet  3  . Cholecalciferol (VITAMIN D) 2000 UNITS CAPS Take 1 capsule by mouth every morning.       . lidocaine (LIDODERM) 5 % Place 1 patch onto the skin daily. Remove & Discard patch within 12 hours or as directed by MD  30 patch  0  . nitroGLYCERIN (NITROSTAT) 0.4 MG SL tablet Place 1 tablet (0.4 mg total) under the tongue every 5 (five) minutes x 3 doses as needed for chest pain.  30 tablet  3  . pantoprazole (PROTONIX) 20 MG tablet Take 20 mg by mouth at bedtime.      . rosuvastatin (CRESTOR) 20 MG tablet Take 1 tablet (20 mg total) by mouth at bedtime.  30 tablet  0   No current facility-administered medications on file prior to visit.    Allergies  Allergen Reactions  . Ampicillin Itching  . Codeine Itching and Nausea And Vomiting  . Nubain [Nalbuphine Hcl] Other (See Comments)    "headache & disoriented"      Past Medical History  Diagnosis Date  . Ischemic cardiomyopathy     a. 08/27/2011 Echo: EF 35-40%, mid-dist anteroseptal/apical akinesis, Gr 1 DD, mild-mod TR, PASP  . Hypotension     a. preventing use of ACEI  . Diverticulosis   .  Hyperlipidemia     takes Crestor nightly  . Asthmatic bronchitis     "as a child; haven't had any since ~ I was 43 yr old" (06/08/2012)  . Anemia     "I've always been anemic; never had a transfusion" (06/08/2012)  . H/O hiatal hernia   . Oral cancer 2003    a. s/p radiation/chemotherapy  . Emphysema of lung   . GERD (gastroesophageal reflux disease)     takes Protonix daily  . Constipation     takes Miralax daily prn  . CAD (coronary artery disease)     a. 08/26/2011 Acute Ant STEMI/Cath/PCI: LM nl, LAD 100p -> 2.75x23 Xience Xpedition, LCX 20, minor irregs, RCA 20-18mid, EF 30-35%, anterior/apical AK;  b. 05/2012 Cath: LM nl, LAD patent LAD stent, LCX 30ost, RCA dom, nl, EF 65%.  . Complication of anesthesia     slow to wake up  . Vertigo     occasionally  . Bruises easily     d/t taking Plavix and ASA  . History of colon polyps   . Dry mouth   . PONV (postoperative nausea and vomiting)   . STEMI (ST elevation myocardial infarction) 08/2011    anterior/notes 09/07/2011 (06/08/2012)    Past Surgical History  Procedure Laterality Date  . Bunionectomy Bilateral  1970's  . Shoulder open rotator cuff repair Left 1990's  . Tonsillectomy  ~ 1951  . Vaginal hysterectomy  ~ 1989    partial  . Tubal ligation  1975  . Coronary angioplasty with stent placement  08/2011    "1" (06/08/2012)  . Cardiac catheterization  06/09/2012    patent prox LAD stent, minor nonobstructive LAD disease, 30% ostial LCx, and otherwise widely patent cors; LVEF 65%  . Colonoscopy    . Peg placement      in 2003 and then removed  . Cholecystectomy  08/18/2012  . Cholecystectomy N/A 08/18/2012    Procedure: LAPAROSCOPIC CHOLECYSTECTOMY WITH INTRAOPERATIVE CHOLANGIOGRAM;  Surgeon: Wilmon Arms. Corliss Skains, MD;  Location: [facility] OR;  Service: General;  Laterality: N/A;    History  Smoking status  . Never Smoker   Smokeless tobacco  . Never Used    History  Alcohol Use No    Family History  Problem Relation Age of  Onset  . Dementia Mother   . Heart disease Father   . Cancer Father     prostate  . Heart disease Paternal Grandfather     Review of Systems: The review of systems is per the HPI.  All other systems were reviewed and are negative.  Physical Exam: BP 104/68  Pulse 74  Ht 4\' 10"  (1.473 m)  Wt 92 lb (41.731 kg)  BMI 19.23 kg/m2  SpO2 97% Patient is very pleasant and in no acute distress. Skin is warm and dry. Color is normal.  HEENT is unremarkable. Normocephalic/atraumatic. PERRL. Sclera are nonicteric. Neck is supple. No masses. No JVD. Lungs are clear. Cardiac exam shows a regular rate and rhythm. Abdomen is soft. Extremities are without edema. Gait and ROM are intact. No gross neurologic deficits noted.  LABORATORY DATA:  Lab Results  Component Value Date   WBC 14.5* 08/18/2012   HGB 12.5 08/18/2012   HCT 37.7 08/18/2012   PLT 200 08/18/2012   GLUCOSE 92 08/11/2012   CHOL 112 06/09/2012   TRIG 104 06/09/2012   HDL 56 06/09/2012   LDLCALC 35 06/09/2012   ALT 10 06/16/2012   AST 18 06/16/2012   NA 141 08/11/2012   K 4.1 08/11/2012   CL 107 08/11/2012   CREATININE 0.95 08/18/2012   BUN 17 08/11/2012   CO2 24 08/11/2012   TSH 3.279 06/08/2012   INR 1.03 06/08/2012   HGBA1C 5.8* 08/26/2011   Assessment / Plan: 1. CAD - s/p DES of LAD August 2013. Cath in May showed nonobstructive disease.Continue ASA 81 mg. Follow up in 6 months.  2. Past anterior MI in August of 2013 treated with DES of the proximal LAD. EF has recovered per last echo.  3. HLD - excellent control on statin.  I will followup again in 6 months and we'll check fasting lab work at that time.

## 2012-12-05 ENCOUNTER — Ambulatory Visit (INDEPENDENT_AMBULATORY_CARE_PROVIDER_SITE_OTHER): Payer: Medicare Other | Admitting: Family Medicine

## 2012-12-05 DIAGNOSIS — Z23 Encounter for immunization: Secondary | ICD-10-CM

## 2012-12-06 ENCOUNTER — Other Ambulatory Visit: Payer: Self-pay

## 2012-12-06 MED ORDER — ROSUVASTATIN CALCIUM 20 MG PO TABS: 20.0000 mg | ORAL_TABLET | Freq: Every day | ORAL | Status: DC

## 2012-12-23 ENCOUNTER — Encounter (HOSPITAL_COMMUNITY): Payer: Self-pay | Admitting: Emergency Medicine

## 2012-12-23 ENCOUNTER — Emergency Department (HOSPITAL_COMMUNITY)
Admission: EM | Admit: 2012-12-23 | Discharge: 2012-12-23 | Disposition: A | Discharge status: Other Acute Inpatient Hospital | Payer: Medicare Other | Source: Home / Self Care | Attending: Emergency Medicine | Admitting: Emergency Medicine

## 2012-12-23 ENCOUNTER — Emergency Department (HOSPITAL_COMMUNITY)
Admission: EM | Admit: 2012-12-23 | Discharge: 2012-12-23 | Disposition: A | Discharge status: Home / Self Care | Payer: Medicare Other | Attending: Emergency Medicine | Admitting: Emergency Medicine

## 2012-12-23 ENCOUNTER — Emergency Department (HOSPITAL_COMMUNITY): Payer: Medicare Other

## 2012-12-23 DIAGNOSIS — J438 Other emphysema: Secondary | ICD-10-CM | POA: Insufficient documentation

## 2012-12-23 DIAGNOSIS — W2209XA Striking against other stationary object, initial encounter: Secondary | ICD-10-CM

## 2012-12-23 DIAGNOSIS — I252 Old myocardial infarction: Secondary | ICD-10-CM | POA: Insufficient documentation

## 2012-12-23 DIAGNOSIS — Z7982 Long term (current) use of aspirin: Secondary | ICD-10-CM | POA: Insufficient documentation

## 2012-12-23 DIAGNOSIS — Z860100 Personal history of colon polyps, unspecified: Secondary | ICD-10-CM | POA: Insufficient documentation

## 2012-12-23 DIAGNOSIS — Y92009 Unspecified place in unspecified non-institutional (private) residence as the place of occurrence of the external cause: Secondary | ICD-10-CM | POA: Insufficient documentation

## 2012-12-23 DIAGNOSIS — K219 Gastro-esophageal reflux disease without esophagitis: Secondary | ICD-10-CM | POA: Insufficient documentation

## 2012-12-23 DIAGNOSIS — S060X1A Concussion with loss of consciousness of 30 minutes or less, initial encounter: Secondary | ICD-10-CM

## 2012-12-23 DIAGNOSIS — Z85819 Personal history of malignant neoplasm of unspecified site of lip, oral cavity, and pharynx: Secondary | ICD-10-CM | POA: Insufficient documentation

## 2012-12-23 DIAGNOSIS — IMO0002 Reserved for concepts with insufficient information to code with codable children: Secondary | ICD-10-CM | POA: Insufficient documentation

## 2012-12-23 DIAGNOSIS — S060X0A Concussion without loss of consciousness, initial encounter: Secondary | ICD-10-CM | POA: Insufficient documentation

## 2012-12-23 DIAGNOSIS — Y93E1 Activity, personal bathing and showering: Secondary | ICD-10-CM | POA: Insufficient documentation

## 2012-12-23 DIAGNOSIS — E785 Hyperlipidemia, unspecified: Secondary | ICD-10-CM | POA: Insufficient documentation

## 2012-12-23 DIAGNOSIS — Z9861 Coronary angioplasty status: Secondary | ICD-10-CM | POA: Insufficient documentation

## 2012-12-23 DIAGNOSIS — I2510 Atherosclerotic heart disease of native coronary artery without angina pectoris: Secondary | ICD-10-CM | POA: Insufficient documentation

## 2012-12-23 DIAGNOSIS — Z862 Personal history of diseases of the blood and blood-forming organs and certain disorders involving the immune mechanism: Secondary | ICD-10-CM | POA: Insufficient documentation

## 2012-12-23 DIAGNOSIS — S0990XA Unspecified injury of head, initial encounter: Secondary | ICD-10-CM

## 2012-12-23 DIAGNOSIS — Z79899 Other long term (current) drug therapy: Secondary | ICD-10-CM | POA: Insufficient documentation

## 2012-12-23 MED ORDER — SODIUM CHLORIDE 0.9 % IV BOLUS (SEPSIS): 500.0000 mL | Freq: Once | INTRAVENOUS | Status: DC

## 2012-12-23 MED ORDER — ONDANSETRON HCL 4 MG/2ML IJ SOLN
4.0000 mg | Freq: Once | INTRAMUSCULAR | Status: AC
  Administered 2012-12-23: 4 mg via INTRAVENOUS
  Filled 2012-12-23: qty 2

## 2012-12-23 MED ORDER — KETOROLAC TROMETHAMINE 30 MG/ML IJ SOLN
30.0000 mg | Freq: Once | INTRAMUSCULAR | Status: AC
  Administered 2012-12-23: 30 mg via INTRAVENOUS
  Filled 2012-12-23: qty 1

## 2012-12-23 MED ORDER — SODIUM CHLORIDE 0.9 % IV BOLUS (SEPSIS)
1000.0000 mL | Freq: Once | INTRAVENOUS | Status: DC
  Administered 2012-12-23: 1000 mL via INTRAVENOUS

## 2012-12-23 NOTE — ED Notes (Signed)
Per pt sts she was reaching go get something out of the tub this am and hit head really hard on a railing in the tub. sts HA, dizziness and nausea. sts she went black for a moment after she hit her head but denies LOC. Pt A&Ox3.

## 2012-12-23 NOTE — ED Notes (Signed)
Pt c/o head inj onset 1330... Reports she was in the bathroom when she hit her head against a metal handicap metal bar Reports her vision "blacked out" but did not loose conscious Sxs also include: HA, dizziness and "queezy" .... Wants to r/o concussion

## 2012-12-23 NOTE — ED Provider Notes (Signed)
TIME SEEN: 5:30 PM  CHIEF COMPLAINT: Head injury  HPI: Patient is a 67 year old female with a history of ischemic cardiomyopathy, hyperlipidemia, oral cancer status post radiation and chemotherapy, coronary artery disease who presents emergency department after a head injury today. Patient was sent from urgent care for further evaluation. Patient reports that she was taking a bath in her bathtub today in lead 4 to grab a bar of soap and struck her head on a bathtub grab bar. She states that she "blacked out" for several seconds but denies loss of consciousness. She reports she has had a frontal headache, felt nauseous and lightheaded. She denies any changes in her vision but states she has had ringing in her ears. No numbness, tingling or focal weakness. She is on 81 mg of aspirin daily but no other anticoagulants.  ROS: See HPI Constitutional: no fever  Eyes: no drainage  ENT: no runny nose   Cardiovascular:  no chest pain  Resp: no SOB  GI: no vomiting GU: no dysuria Integumentary: no rash  Allergy: no hives  Musculoskeletal: no leg swelling  Neurological: no slurred speech ROS otherwise negative  PAST MEDICAL HISTORY/PAST SURGICAL HISTORY:  Past Medical History  Diagnosis Date  . Ischemic cardiomyopathy     a. 08/27/2011 Echo: EF 35-40%, mid-dist anteroseptal/apical akinesis, Gr 1 DD, mild-mod TR, PASP  . Hypotension     a. preventing use of ACEI  . Diverticulosis   . Hyperlipidemia     takes Crestor nightly  . Asthmatic bronchitis     "as a child; haven't had any since ~ I was 47 yr old" (06/08/2012)  . Anemia     "I've always been anemic; never had a transfusion" (06/08/2012)  . H/O hiatal hernia   . Oral cancer 2003    a. s/p radiation/chemotherapy  . Emphysema of lung   . GERD (gastroesophageal reflux disease)     takes Protonix daily  . Constipation     takes Miralax daily prn  . CAD (coronary artery disease)     a. 08/26/2011 Acute Ant STEMI/Cath/PCI: LM nl, LAD  100p -> 2.75x23 Xience Xpedition, LCX 20, minor irregs, RCA 20-7mid, EF 30-35%, anterior/apical AK;  b. 05/2012 Cath: LM nl, LAD patent LAD stent, LCX 30ost, RCA dom, nl, EF 65%.  . Complication of anesthesia     slow to wake up  . Vertigo     occasionally  . Bruises easily     d/t taking Plavix and ASA  . History of colon polyps   . Dry mouth   . PONV (postoperative nausea and vomiting)   . STEMI (ST elevation myocardial infarction) 08/2011    anterior/notes 09/07/2011 (06/08/2012)    MEDICATIONS:  Prior to Admission medications   Medication Sig Start Date End Date Taking? Authorizing Provider  aspirin EC 81 MG tablet Take 1 tablet (81 mg total) by mouth daily. 09/08/12   Peter M [place], MD  Cholecalciferol (VITAMIN D) 2000 UNITS CAPS Take 1 capsule by mouth every morning.     Historical Provider, MD  lidocaine (LIDODERM) 5 % Place 1 patch onto the skin daily. Remove & Discard patch within 12 hours or as directed by MD 10/21/12   Donita Brooks, MD  nitroGLYCERIN (NITROSTAT) 0.4 MG SL tablet Place 1 tablet (0.4 mg total) under the tongue every 5 (five) minutes x 3 doses as needed for chest pain. 09/08/12 09/16/13  Peter M [place], MD  pantoprazole (PROTONIX) 20 MG tablet Take 20 mg by  mouth at bedtime. 04/11/12   Peter M [place], MD  rosuvastatin (CRESTOR) 20 MG tablet Take 1 tablet (20 mg total) by mouth at bedtime. 12/06/12   Peter M [place], MD    ALLERGIES:  Allergies  Allergen Reactions  . Ampicillin Itching  . Codeine Itching and Nausea And Vomiting  . Nubain [Nalbuphine Hcl] Other (See Comments)    "headache & disoriented"      SOCIAL HISTORY:  History  Substance Use Topics  . Smoking status: Never Smoker   . Smokeless tobacco: Never Used  . Alcohol Use: No    FAMILY HISTORY: Family History  Problem Relation Age of Onset  . Dementia Mother   . Heart disease Father   . Cancer Father     prostate  . Heart disease Paternal Grandfather     EXAM: BP 116/62  Pulse 61   Temp(Src) 98.6 F (37 C)  Resp 18  Ht 4\' 10"  (1.473 m)  Wt 94 lb (42.638 kg)  BMI 19.65 kg/m2  SpO2 100% CONSTITUTIONAL: Alert and oriented and responds appropriately to questions. Well-appearing; well-nourished HEAD: Normocephalic; small bruise to the left forehead EYES: Conjunctivae clear, PERRL ENT: normal nose; no rhinorrhea; moist mucous membranes; pharynx without lesions noted; no septal hematoma or dental injury NECK: Supple, no meningismus, no LAD  CARD: RRR; S1 and S2 appreciated; no murmurs, no clicks, no rubs, no gallops RESP: Normal chest excursion without splinting or tachypnea; breath sounds clear and equal bilaterally; no wheezes, no rhonchi, no rales,  ABD/GI: Normal bowel sounds; non-distended; soft, non-tender, no rebound, no guarding BACK:  The back appears normal and is non-tender to palpation, there is no CVA tenderness; no midline spinal tenderness, step-off or deformity EXT: Normal ROM in all joints; non-tender to palpation; no edema; normal capillary refill; no cyanosis    SKIN: Normal color for age and race; warm NEURO: Moves all extremities equally, strength 5/5 in all 4 extremities, sensation to light touch intact diffusely, no dysmetria to finger-nose testing, cranial nerves II through XII intact PSYCH: The patient's mood and manner are appropriate. Grooming and personal hygiene are appropriate.  MEDICAL DECISION MAKING: Patient here with postconcussive symptoms. She is hemodynamically stable, neurologically intact. Head CT shows no intracranial hemorrhage or skull fracture. We'll give Toradol, Zofran and a small amount of IV fluids for symptomatic relief. Anticipate discharge home with outpatient followup.   She reports that her headache is almost completely gone. She states she is ready for discharge home. Have discussed head injury return precautions, supportive care instructions for postconcussive symptoms and importance of PCP followup. Have instructed patient  to alternate between ibuprofen and Tylenol as needed for pain. She verbalizes understanding as well as family members at the bedside.    Ct Head Wo Contrast  12/23/2012   CLINICAL DATA:  Head injury  EXAM: CT HEAD WITHOUT CONTRAST  TECHNIQUE: Contiguous axial images were obtained from the base of the skull through the vertex without intravenous contrast.  COMPARISON:  None.  FINDINGS: No skull fracture is noted. Paranasal sinuses and mastoid air cells are unremarkable.  No intracranial hemorrhage, mass effect or midline shift. No hydrocephalus. Mild cerebral atrophy. No intra or extra-axial fluid collection. No acute cortical infarction. No mass lesion is noted on this unenhanced scan.  IMPRESSION: No acute intracranial abnormality.  Mild cerebral atrophy.   Electronically Signed   By: Natasha Mead M.D.   On: 12/23/2012 17:23         Layla Maw Caprice Wasko, DO 12/23/12  [id number] 

## 2012-12-23 NOTE — ED Notes (Signed)
The pt is c/o a headache.  She came from [facility] for treatment.  The pt reports that her headache is improved somewhat and is not nearly  As severe

## 2012-12-23 NOTE — ED Provider Notes (Signed)
Chief Complaint:   Chief Complaint  Patient presents with  . Head Injury    History of Present Illness:   TASHIANNA BROOME is a 67 year old female who bent over to pick up something on her bathtub today around 1:30 PM when she struck her left forehead against a bathtub grab bar. The patient states she "blacked out" for a few seconds up to possibly a minute. She did not fall and hit the floor, was able to maintain a standing posture, but she states her vision blacked out for this time interval. Ever since then she's had a headache, felt nauseated, dizzy, her thinking as been fuzzy, she's been unsteady on her feet, and her tinnitus is worse. She denies any double vision, but she did have some blurry vision initially after the incident, however this has cleared up. She denies any difficulty with speech or swallowing. There is no neck pain, numbness, tingling, paresthesias, localized muscle weakness, or vomiting.  Review of Systems:  Other than noted above, the patient denies any of the following symptoms: Systemic:  No fever or chills. Eye:  No eye pain, redness, diplopia or blurred vision ENT:  No bleeding from nose or ears.  No loose or broken teeth. Neck:  No pain or limited ROM. GI:  No nausea or vomiting. Neuro:  No loss of consciousness, seizure activity, numbness, tingling, or weakness.  PMFSH:  Past medical history, family history, social history, meds, and allergies were reviewed. She has a history of ischemic cardiomyopathy, heart attack, hypertension, hyperlipidemia, COPD, and oral cancer. She is allergic to ampicillin, codeine, Nubain, and Propulsid. Current meds include Protonix, Crestor, and aspirin.  Physical Exam:   Vital signs:  BP 117/63  Pulse 62  Temp(Src) 98.8 F (37.1 C) (Oral)  Resp 16  SpO2 94% General:  Alert and oriented times 3.  In no distress. Eye:  PERRL, full EOMs.  Lids and conjunctivas normal. Fundi benign. HEENT:  She has a large, tender hematoma in her left  frontal area,  TMs and canals normal, nasal mucosa normal.  No oral lacerations.  There was no obvious oral trauma. Neck:  Non tender.  Full ROM without pain. Neurological:  Alert and oriented.  Cranial nerves intact.  No pronator drift. Finger to nose test was normal.  No muscle weakness. DTRs were symmetrical.  Sensation was intact to light touch. Gait was normal.  Romberg's sign negative.  She's unable to perform tandem gait at all.  Assessment:  The encounter diagnosis was Concussion, with loss of consciousness of 30 minutes or less, initial encounter.  A. she has a concussion, questionable loss of consciousness for less than a minute, and soft neurological signs. But she is 19, has some medical comorbid disease, and is on daily aspirin. I think always things going on, she warrants a CT scan, thus we will transfer her by shuttle to the emergency department.  Plan:  The patient was transferred to the ED via shuttle in stable condition.  Medical Decision Making:  67 year old female bent over to get something out of tub this afternoon and hit her head on a bathtub grab bar.  She "blacked out" for seconds to minutes, but did not fall and hit floor.  Ever since then she's had headache, nausea, tinnitus, dizziness, ataxia, and thinking is foggy.  She denies any weakness, numbness, or difficulty with speech.  Exam shows a hematoma in left frontal area and she cannot do tandem gait.  I think she has had  a concussion, but with history of "blacking out" and soft neuro signs, I feel she needs a CT scan in view of age and fact that she takes ASA daily.         Reuben Likes, MD 12/23/12 [phone]

## 2012-12-23 NOTE — ED Notes (Signed)
The pt reports that her head pain has increased instead of getting better.  Almost infused from the liter bag

## 2012-12-23 NOTE — ED Notes (Signed)
The edp wants  The to come from the liter bag hanging.  bag not used.  Not scanned

## 2012-12-29 ENCOUNTER — Ambulatory Visit (INDEPENDENT_AMBULATORY_CARE_PROVIDER_SITE_OTHER): Payer: Medicare Other | Admitting: Family Medicine

## 2012-12-29 ENCOUNTER — Encounter: Payer: Self-pay | Admitting: Family Medicine

## 2012-12-29 ENCOUNTER — Other Ambulatory Visit: Payer: Self-pay | Admitting: Family Medicine

## 2012-12-29 VITALS — BP 98/64 | HR 74 | Temp 98.2°F | Resp 18 | Ht <= 58 in | Wt 93.0 lb

## 2012-12-29 DIAGNOSIS — R5381 Other malaise: Secondary | ICD-10-CM

## 2012-12-29 DIAGNOSIS — S060X0A Concussion without loss of consciousness, initial encounter: Secondary | ICD-10-CM

## 2012-12-29 DIAGNOSIS — Z23 Encounter for immunization: Secondary | ICD-10-CM

## 2012-12-29 NOTE — Progress Notes (Signed)
Subjective:    Patient ID: Pamela Kidd, female    DOB: 11-30-1945, 67 y.o.   MRN: [id number]  HPI The patient is here today for a hospital followup. On December 5 she was leaning over to get the soap in the shower when she hit her head on a shower bar. She states that everything went black although she denies actually losing consciousness. She developed a sudden severe left-sided headache. She went immediately to the urgent care and was then referred to the emergency room. In the emergency room a head CT was negative for an acute intracranial hemorrhage. She was discharged home. She is here today for followup. Over the last week she denies any headaches, blurry vision, dizziness, altered mental status, migraines, or other neurologic deficits. She does complain of generalized fatigue. She has a history of anemia. Her blood pressure is significant today for being 98/64. Past Medical History  Diagnosis Date  . Ischemic cardiomyopathy     a. 08/27/2011 Echo: EF 35-40%, mid-dist anteroseptal/apical akinesis, Gr 1 DD, mild-mod TR, PASP  . Hypotension     a. preventing use of ACEI  . Diverticulosis   . Hyperlipidemia     takes Crestor nightly  . Asthmatic bronchitis     "as a child; haven't had any since ~ I was 38 yr old" (06/08/2012)  . Anemia     "I've always been anemic; never had a transfusion" (06/08/2012)  . H/O hiatal hernia   . Oral cancer 2003    a. s/p radiation/chemotherapy  . Emphysema of lung   . GERD (gastroesophageal reflux disease)     takes Protonix daily  . Constipation     takes Miralax daily prn  . CAD (coronary artery disease)     a. 08/26/2011 Acute Ant STEMI/Cath/PCI: LM nl, LAD 100p -> 2.75x23 Xience Xpedition, LCX 20, minor irregs, RCA 20-45mid, EF 30-35%, anterior/apical AK;  b. 05/2012 Cath: LM nl, LAD patent LAD stent, LCX 30ost, RCA dom, nl, EF 65%.  . Complication of anesthesia     slow to wake up  . Vertigo     occasionally  . Bruises easily     d/t  taking Plavix and ASA  . History of colon polyps   . Dry mouth   . PONV (postoperative nausea and vomiting)   . STEMI (ST elevation myocardial infarction) 08/2011    anterior/notes 09/07/2011 (06/08/2012)   Past Surgical History  Procedure Laterality Date  . Bunionectomy Bilateral 1970's  . Shoulder open rotator cuff repair Left 1990's  . Tonsillectomy  ~ 1951  . Vaginal hysterectomy  ~ 1989    partial  . Tubal ligation  1975  . Coronary angioplasty with stent placement  08/2011    "1" (06/08/2012)  . Cardiac catheterization  06/09/2012    patent prox LAD stent, minor nonobstructive LAD disease, 30% ostial LCx, and otherwise widely patent cors; LVEF 65%  . Colonoscopy    . Peg placement      in 2003 and then removed  . Cholecystectomy  08/18/2012  . Cholecystectomy N/A 08/18/2012    Procedure: LAPAROSCOPIC CHOLECYSTECTOMY WITH INTRAOPERATIVE CHOLANGIOGRAM;  Surgeon: Wilmon Arms. Corliss Skains, MD;  Location: [facility] OR;  Service: General;  Laterality: N/A;   Current Outpatient Prescriptions on File Prior to Visit  Medication Sig Dispense Refill  . aspirin EC 81 MG tablet Take 1 tablet (81 mg total) by mouth daily.  90 tablet  3  . nitroGLYCERIN (NITROSTAT) 0.4 MG SL tablet Place 1  tablet (0.4 mg total) under the tongue every 5 (five) minutes x 3 doses as needed for chest pain.  30 tablet  3  . pantoprazole (PROTONIX) 20 MG tablet Take 20 mg by mouth at bedtime.      . rosuvastatin (CRESTOR) 20 MG tablet Take 1 tablet (20 mg total) by mouth at bedtime.  30 tablet  6   No current facility-administered medications on file prior to visit.   Allergies  Allergen Reactions  . Ampicillin Itching  . Codeine Itching and Nausea And Vomiting  . Nubain [Nalbuphine Hcl] Other (See Comments)    "headache & disoriented"     History   Social History  . Marital Status: Married    Spouse Name: N/A    Number of Children: N/A  . Years of Education: N/A   Occupational History  . Not on file.   Social  History Main Topics  . Smoking status: Never Smoker   . Smokeless tobacco: Never Used  . Alcohol Use: No  . Drug Use: No  . Sexual Activity: No   Other Topics Concern  . Not on file   Social History Narrative  . No narrative on file      Review of Systems  All other systems reviewed and are negative.       Objective:   Physical Exam  Vitals reviewed. Constitutional: She is oriented to person, place, and time.  Eyes: Conjunctivae are normal. No scleral icterus.  Neck: Neck supple. No JVD present. No thyromegaly present.  Cardiovascular: Normal rate, regular rhythm, normal heart sounds and intact distal pulses.  Exam reveals no gallop and no friction rub.   No murmur heard. Pulmonary/Chest: Effort normal and breath sounds normal. No respiratory distress. She has no wheezes. She has no rales. She exhibits no tenderness.  Abdominal: Soft. Bowel sounds are normal. She exhibits no distension and no mass. There is no tenderness. There is no rebound and no guarding.  Musculoskeletal: She exhibits no edema.  Lymphadenopathy:    She has no cervical adenopathy.  Neurological: She is alert and oriented to person, place, and time. She displays normal reflexes. No cranial nerve deficit. She exhibits normal muscle tone. Coordination normal.  Psychiatric: She has a normal mood and affect. Her behavior is normal. Judgment and thought content normal.          Assessment & Plan:  1. Other malaise and fatigue To the fascia the history of anemia, her blood pressure is somewhat low and she's complaining of fatigue, I will check a CBC, CMP, and TSH. If she is still anemic I would recommend checking an iron level as well as a B12 level.  She may also benefit from stool cards. - CBC with Differential - COMPLETE METABOLIC PANEL WITH GFR - TSH  2. Need for prophylactic vaccination against Streptococcus pneumoniae (pneumococcus) Patient was given Prevnar 13 today in the office. - Pneumococcal  conjugate vaccine 13-valent IM  3. Concussion with no loss of consciousness, initial encounter I believe the patient had a grade 1 concussion without loss of consciousness. At present she is completely asymptomatic with no signs of postconcussive syndrome or other neurologic deficits. I do not feel any further followup is necessary at this time for this condition.

## 2012-12-30 ENCOUNTER — Inpatient Hospital Stay: Payer: Medicare Other | Admitting: Family Medicine

## 2012-12-30 LAB — CBC WITH DIFFERENTIAL/PLATELET
Basophils Relative: 1 % (ref 0–1)
Eosinophils Absolute: 0.1 10*3/uL (ref 0.0–0.7)
Eosinophils Relative: 2 % (ref 0–5)
Lymphs Abs: 1.4 10*3/uL (ref 0.7–4.0)
MCH: 28.9 pg (ref 26.0–34.0)
MCHC: 33.8 g/dL (ref 30.0–36.0)
MCV: 85.5 fL (ref 78.0–100.0)
Platelets: 270 10*3/uL (ref 150–400)
RBC: 4.88 MIL/uL (ref 3.87–5.11)

## 2012-12-30 LAB — COMPLETE METABOLIC PANEL WITH GFR
ALT: 13 U/L (ref 0–35)
CO2: 25 mEq/L (ref 19–32)
Creat: 0.99 mg/dL (ref 0.50–1.10)
GFR, Est African American: 68 mL/min
GFR, Est Non African American: 59 mL/min — ABNORMAL LOW
Glucose, Bld: 90 mg/dL (ref 70–99)
Total Bilirubin: 0.4 mg/dL (ref 0.3–1.2)

## 2013-01-02 LAB — T4, FREE: Free T4: 1.03 ng/dL (ref 0.80–1.80)

## 2013-01-16 ENCOUNTER — Telehealth: Payer: Self-pay | Admitting: Family Medicine

## 2013-01-16 NOTE — Telephone Encounter (Signed)
Please get patient's symptoms.

## 2013-01-16 NOTE — Telephone Encounter (Signed)
Pharmacy [facility] on [address] Call back number is [phone] Pts ears are bothering her really bad she is wanting something called in for her

## 2013-01-16 NOTE — Telephone Encounter (Signed)
Pt is having severe ringing in her ears. No pain no fever. Is there anything we can call in for that?

## 2013-01-16 NOTE — Telephone Encounter (Signed)
There are no meds that effectively treat tinnitus unless she has a cerumen impaction causing the hearing loss.

## 2013-01-17 NOTE — Telephone Encounter (Signed)
..  Patient aware per vm 

## 2013-01-20 ENCOUNTER — Telehealth: Payer: Self-pay | Admitting: Family Medicine

## 2013-01-20 MED ORDER — OSELTAMIVIR PHOSPHATE 75 MG PO CAPS: 75.0000 mg | ORAL_CAPSULE | Freq: Two times a day (BID) | ORAL | Status: DC

## 2013-01-20 NOTE — Telephone Encounter (Signed)
With the body aches and URI symptoms it sounds like the flu, I would try tamiflu 75 mg pobid for 5 days.  NTBS if symptoms worsen or change.

## 2013-01-20 NOTE — Telephone Encounter (Signed)
Has had fever, body aches, congestion head and chest.  Green secretions. Has had x 1 week.  What can you recommend??

## 2013-01-20 NOTE — Telephone Encounter (Signed)
RX to pharmacy.  Pt made aware

## 2013-02-14 ENCOUNTER — Telehealth: Payer: Self-pay | Admitting: Cardiology

## 2013-02-14 NOTE — Telephone Encounter (Signed)
Patient has high risk CAD and needs to stay on crestor. LFTs are normal. I don't think crestor would cause yellow spots in eyes. Maybe she needs to have her eyes checked by opthalmology or at least her primary care before stopping meds.  Pranavi Aure [place] MD, [facility]

## 2013-02-14 NOTE — Telephone Encounter (Signed)
Returned call to patient she stated she has noticed yellow spots in both eyes for the past 2 weeks.Stated this is side effects from Crestor.Liver functions checked at [place] office last month were normal.Advised to hold Crestor,will send message to [place] for advice.

## 2013-02-14 NOTE — Telephone Encounter (Signed)
New Prob   Pt states she has developed some yellow spots in her eyes that she associates with her Crestor. She is requesting to speak to a nurse regarding her concerns. Please call.

## 2013-02-14 NOTE — Telephone Encounter (Signed)
Patient called no answer.LMTC. 

## 2013-02-14 NOTE — Telephone Encounter (Signed)
Received call back from patient Dr.Jordan advised needs to stay on Crestor,does not think Crestor would cause yellow spots. Advised needs to see opthalmology or PCP.

## 2013-02-16 ENCOUNTER — Telehealth: Payer: Self-pay | Admitting: Cardiology

## 2013-02-16 NOTE — Telephone Encounter (Signed)
Returned call to patient she stated she has appointment with a ophtalmologist 02/22/13.Stated she will have Dr.send Dr.Jordan a copy of the report.

## 2013-02-16 NOTE — Telephone Encounter (Signed)
New message   Patient calling back to speak with nurse for clarification on what you told her. Did not get a clear understanding on what is was she was saying . Patient is asking for a call back

## 2013-02-27 ENCOUNTER — Ambulatory Visit (INDEPENDENT_AMBULATORY_CARE_PROVIDER_SITE_OTHER): Payer: Medicare HMO | Admitting: Cardiology

## 2013-02-27 ENCOUNTER — Other Ambulatory Visit (INDEPENDENT_AMBULATORY_CARE_PROVIDER_SITE_OTHER): Payer: Medicare HMO

## 2013-02-27 ENCOUNTER — Encounter: Payer: Self-pay | Admitting: Cardiology

## 2013-02-27 VITALS — BP 110/58 | HR 77 | Ht <= 58 in | Wt 93.0 lb

## 2013-02-27 DIAGNOSIS — I2510 Atherosclerotic heart disease of native coronary artery without angina pectoris: Secondary | ICD-10-CM

## 2013-02-27 DIAGNOSIS — E7800 Pure hypercholesterolemia, unspecified: Secondary | ICD-10-CM

## 2013-02-27 DIAGNOSIS — I255 Ischemic cardiomyopathy: Secondary | ICD-10-CM

## 2013-02-27 DIAGNOSIS — I2589 Other forms of chronic ischemic heart disease: Secondary | ICD-10-CM

## 2013-02-27 LAB — BASIC METABOLIC PANEL
BUN: 10 mg/dL (ref 6–23)
CHLORIDE: 109 meq/L (ref 96–112)
CO2: 24 meq/L (ref 19–32)
CREATININE: 1 mg/dL (ref 0.4–1.2)
Calcium: 9.2 mg/dL (ref 8.4–10.5)
GFR: 59.35 mL/min — ABNORMAL LOW (ref >60.00)
GLUCOSE: 89 mg/dL (ref 70–99)
POTASSIUM: 4.1 meq/L (ref 3.5–5.1)
Sodium: 141 mEq/L (ref 135–145)

## 2013-02-27 LAB — LIPID PANEL
CHOL/HDL RATIO: 2
CHOLESTEROL: 121 mg/dL (ref 0–200)
HDL: 61.6 mg/dL (ref >39.00)
LDL CALC: 44 mg/dL (ref 0–99)
Triglycerides: 76 mg/dL (ref 0.0–149.0)
VLDL: 15.2 mg/dL (ref 0.0–40.0)

## 2013-02-27 LAB — HEPATIC FUNCTION PANEL
ALK PHOS: 70 U/L (ref 39–117)
ALT: 14 U/L (ref 0–35)
AST: 22 U/L (ref 0–37)
Albumin: 3.8 g/dL (ref 3.5–5.2)
BILIRUBIN DIRECT: 0 mg/dL (ref 0.0–0.3)
BILIRUBIN TOTAL: 0.4 mg/dL (ref 0.3–1.2)
TOTAL PROTEIN: 6.7 g/dL (ref 6.0–8.3)

## 2013-02-27 NOTE — Progress Notes (Signed)
[place] Date of Birth: 1945-08-31 Medical Record #[id number]  History of Present Illness:  [name] is seen back today for a follow up visit. She has known CAD with anterior MI in August of 2013 treated with DES of the proximal LAD. EF has recovered. Negative Myoview in October of 2013. Repeat cath 5/14 showed nonobstructive disease. Other issues include HLD and GERD. Has rare PVCs on past event monitor. Denies any current chest pain or SOB. The only thing she notes is occasional strange sensation in her chest that feels empty. She did have the flu 3-4 weeks ago and was treated with Tamiflu. She does complain of pain between her shoulder blades when she stands a lot. She is going to need extensive dental extractions.   Current Outpatient Prescriptions on File Prior to Visit  Medication Sig Dispense Refill  . aspirin EC 81 MG tablet Take 1 tablet (81 mg total) by mouth daily.  90 tablet  3  . nitroGLYCERIN (NITROSTAT) 0.4 MG SL tablet Place 1 tablet (0.4 mg total) under the tongue every 5 (five) minutes x 3 doses as needed for chest pain.  30 tablet  3  . pantoprazole (PROTONIX) 20 MG tablet Take 20 mg by mouth at bedtime.      . rosuvastatin (CRESTOR) 20 MG tablet Take 1 tablet (20 mg total) by mouth at bedtime.  30 tablet  6   No current facility-administered medications on file prior to visit.    Allergies  Allergen Reactions  . Ampicillin Itching  . Codeine Itching and Nausea And Vomiting  . Nubain [Nalbuphine Hcl] Other (See Comments)    "headache & disoriented"      Past Medical History  Diagnosis Date  . Ischemic cardiomyopathy     a. 08/27/2011 Echo: EF 35-40%, mid-dist anteroseptal/apical akinesis, Gr 1 DD, mild-mod TR, PASP 49mmHg  . Hypotension     a. preventing use of ACEI  . Diverticulosis   . Hyperlipidemia     takes Crestor nightly  . Asthmatic bronchitis     "as a child; haven't had any since ~ I was 82 yr old" (06/08/2012)  . Anemia     "I've always been  anemic; never had a transfusion" (06/08/2012)  . H/O hiatal hernia   . Oral cancer 2003    a. s/p radiation/chemotherapy  . Emphysema of lung   . GERD (gastroesophageal reflux disease)     takes Protonix daily  . Constipation     takes Miralax daily prn  . CAD (coronary artery disease)     a. 08/26/2011 Acute Ant STEMI/Cath/PCI: LM nl, LAD 100p -> 2.75x23 Xience Xpedition, LCX 20, minor irregs, RCA 20-36mid, EF 30-35%, anterior/apical AK;  b. 05/2012 Cath: LM nl, LAD patent LAD stent, LCX 30ost, RCA dom, nl, EF 65%.  . Complication of anesthesia     slow to wake up  . Vertigo     occasionally  . Bruises easily     d/t taking Plavix and ASA  . History of colon polyps   . Dry mouth   . PONV (postoperative nausea and vomiting)   . STEMI (ST elevation myocardial infarction) 08/2011    anterior/notes 09/07/2011 (06/08/2012)    Past Surgical History  Procedure Laterality Date  . Bunionectomy Bilateral 1970's  . Shoulder open rotator cuff repair Left 1990's  . Tonsillectomy  ~ 1951  . Vaginal hysterectomy  ~ 1989    partial  . Tubal ligation  1975  . Coronary angioplasty  with stent placement  08/2011    "1" (06/08/2012)  . Cardiac catheterization  06/09/2012    patent prox LAD stent, minor nonobstructive LAD disease, 30% ostial LCx, and otherwise widely patent cors; LVEF 65%  . Colonoscopy    . Peg placement      in 2003 and then removed  . Cholecystectomy  08/18/2012  . Cholecystectomy N/A 08/18/2012    Procedure: LAPAROSCOPIC CHOLECYSTECTOMY WITH INTRAOPERATIVE CHOLANGIOGRAM;  Surgeon: Imogene Burn. Georgette Dover, MD;  Location: [facility];  Service: General;  Laterality: N/A;    History  Smoking status  . Never Smoker   Smokeless tobacco  . Never Used    History  Alcohol Use No    Family History  Problem Relation Age of Onset  . Dementia Mother   . Heart disease Father   . Cancer Father     prostate  . Heart disease Paternal Grandfather     Review of Systems: The review of systems is  per the HPI.  All other systems were reviewed and are negative.  Physical Exam: BP 110/58  Pulse 77  Ht 4\' 10"  (1.473 m)  Wt 93 lb (42.185 kg)  BMI 19.44 kg/m2  SpO2 98% Patient is very pleasant and in no acute distress. Skin is warm and dry. Color is normal.  HEENT is unremarkable. Normocephalic/atraumatic. PERRL. Sclera are nonicteric. Neck is supple. No masses. No JVD. Lungs are clear. Cardiac exam shows a regular rate and rhythm. Abdomen is soft. Extremities are without edema. Gait and ROM are intact. No gross neurologic deficits noted.  LABORATORY DATA:  Lab Results  Component Value Date   WBC 7.4 12/29/2012   HGB 14.1 12/29/2012   HCT 41.7 12/29/2012   PLT 270 12/29/2012   GLUCOSE 90 12/29/2012   CHOL 112 06/09/2012   TRIG 104 06/09/2012   HDL 56 06/09/2012   LDLCALC 35 06/09/2012   ALT 13 12/29/2012   AST 19 12/29/2012   NA 139 12/29/2012   K 4.0 12/29/2012   CL 105 12/29/2012   CREATININE 0.99 12/29/2012   BUN 13 12/29/2012   CO2 25 12/29/2012   TSH 4.557* 12/29/2012   INR 1.03 06/08/2012   HGBA1C 5.8* 08/26/2011   Assessment / Plan: 1. CAD - s/p DES of LAD August 2013. Cath in May 2014 showed nonobstructive disease.Continue ASA 81 mg. Follow up in 6 months.  2. Past anterior MI in August of 2013 treated with DES of the proximal LAD. EF has recovered per last echo.  3. HLD - excellent control on statin.  She is cleared for her dental work. I will follow up in 6 months. Lab work drawn today.

## 2013-02-27 NOTE — Patient Instructions (Signed)
We will call with the results of your lab work today  I will see you in 6 months.

## 2013-04-17 ENCOUNTER — Other Ambulatory Visit: Payer: Self-pay | Admitting: *Deleted

## 2013-04-17 MED ORDER — PANTOPRAZOLE SODIUM 20 MG PO TBEC: 20.0000 mg | DELAYED_RELEASE_TABLET | Freq: Every day | ORAL | Status: DC

## 2013-07-08 ENCOUNTER — Other Ambulatory Visit: Payer: Self-pay | Admitting: Cardiology

## 2013-08-07 ENCOUNTER — Other Ambulatory Visit: Payer: Self-pay | Admitting: Family Medicine

## 2013-08-07 ENCOUNTER — Other Ambulatory Visit: Payer: Medicare HMO

## 2013-08-07 DIAGNOSIS — I2510 Atherosclerotic heart disease of native coronary artery without angina pectoris: Secondary | ICD-10-CM

## 2013-08-07 DIAGNOSIS — Z79899 Other long term (current) drug therapy: Secondary | ICD-10-CM

## 2013-08-07 DIAGNOSIS — Z0000 Encounter for general adult medical examination without abnormal findings: Secondary | ICD-10-CM

## 2013-08-07 DIAGNOSIS — I959 Hypotension, unspecified: Secondary | ICD-10-CM

## 2013-08-07 DIAGNOSIS — E7800 Pure hypercholesterolemia, unspecified: Secondary | ICD-10-CM

## 2013-08-07 LAB — COMPLETE METABOLIC PANEL WITH GFR
ALK PHOS: 67 U/L (ref 39–117)
ALT: 12 U/L (ref 0–35)
AST: 18 U/L (ref 0–37)
Albumin: 4.2 g/dL (ref 3.5–5.2)
BILIRUBIN TOTAL: 0.5 mg/dL (ref 0.2–1.2)
BUN: 9 mg/dL (ref 6–23)
CO2: 26 mEq/L (ref 19–32)
Calcium: 9.5 mg/dL (ref 8.4–10.5)
Chloride: 107 mEq/L (ref 96–112)
Creat: 1.05 mg/dL (ref 0.50–1.10)
GFR, EST NON AFRICAN AMERICAN: 55 mL/min — AB
GFR, Est African American: 63 mL/min
GLUCOSE: 113 mg/dL — AB (ref 70–99)
POTASSIUM: 4.2 meq/L (ref 3.5–5.3)
SODIUM: 143 meq/L (ref 135–145)
Total Protein: 6.7 g/dL (ref 6.0–8.3)

## 2013-08-07 LAB — CBC WITH DIFFERENTIAL/PLATELET
BASOS ABS: 0.1 10*3/uL (ref 0.0–0.1)
BASOS PCT: 1 % (ref 0–1)
EOS PCT: 1 % (ref 0–5)
Eosinophils Absolute: 0.1 10*3/uL (ref 0.0–0.7)
HEMATOCRIT: 41.7 % (ref 36.0–46.0)
Hemoglobin: 14 g/dL (ref 12.0–15.0)
LYMPHS PCT: 9 % — AB (ref 12–46)
Lymphs Abs: 0.7 10*3/uL (ref 0.7–4.0)
MCH: 29.5 pg (ref 26.0–34.0)
MCHC: 33.6 g/dL (ref 30.0–36.0)
MCV: 87.8 fL (ref 78.0–100.0)
MONO ABS: 0.6 10*3/uL (ref 0.1–1.0)
Monocytes Relative: 8 % (ref 3–12)
NEUTROS ABS: 6.3 10*3/uL (ref 1.7–7.7)
Neutrophils Relative %: 81 % — ABNORMAL HIGH (ref 43–77)
PLATELETS: 244 10*3/uL (ref 150–400)
RBC: 4.75 MIL/uL (ref 3.87–5.11)
RDW: 14.3 % (ref 11.5–15.5)
WBC: 7.8 10*3/uL (ref 4.0–10.5)

## 2013-08-07 LAB — LIPID PANEL
CHOL/HDL RATIO: 1.9 ratio
Cholesterol: 131 mg/dL (ref 0–200)
HDL: 69 mg/dL (ref >39)
LDL CALC: 48 mg/dL (ref 0–99)
TRIGLYCERIDES: 68 mg/dL (ref <150)
VLDL: 14 mg/dL (ref 0–40)

## 2013-08-07 LAB — TSH: TSH: 4.893 u[IU]/mL — ABNORMAL HIGH (ref 0.350–4.500)

## 2013-08-08 LAB — HEMOGLOBIN A1C
HEMOGLOBIN A1C: 5.9 % — AB (ref <5.7)
Mean Plasma Glucose: 123 mg/dL — ABNORMAL HIGH (ref <117)

## 2013-08-09 ENCOUNTER — Other Ambulatory Visit: Payer: Self-pay

## 2013-08-09 DIAGNOSIS — Z1231 Encounter for screening mammogram for malignant neoplasm of breast: Secondary | ICD-10-CM

## 2013-08-12 ENCOUNTER — Other Ambulatory Visit: Payer: Self-pay | Admitting: Cardiology

## 2013-08-14 ENCOUNTER — Encounter: Payer: Self-pay | Admitting: Family Medicine

## 2013-08-14 ENCOUNTER — Other Ambulatory Visit: Payer: Self-pay | Admitting: *Deleted

## 2013-08-14 ENCOUNTER — Ambulatory Visit (INDEPENDENT_AMBULATORY_CARE_PROVIDER_SITE_OTHER): Payer: Medicare HMO | Admitting: Family Medicine

## 2013-08-14 VITALS — BP 104/72 | HR 82 | Temp 99.4°F | Resp 14 | Ht <= 58 in | Wt 90.5 lb

## 2013-08-14 DIAGNOSIS — Z0000 Encounter for general adult medical examination without abnormal findings: Secondary | ICD-10-CM

## 2013-08-14 DIAGNOSIS — R6881 Early satiety: Secondary | ICD-10-CM

## 2013-08-14 MED ORDER — ROSUVASTATIN CALCIUM 20 MG PO TABS: 20.0000 mg | ORAL_TABLET | Freq: Every day | ORAL | Status: DC

## 2013-08-14 NOTE — Progress Notes (Signed)
Subjective:    Patient ID: Pamela Kidd, female    DOB: [birth year], 68 y.o.   MRN: [id number]  HPI Patient has been having episodic epigastric abdominal pain for approximately the last 12 months. Patient had a cholecystectomy which improved some of her symptoms. However over the last month she's lost 3 additional pounds. She reports early satiety. She also reports food sticking in the distal esophagus. She attributes the symptoms to anxiety and depression. Currently her and her spouse are under tremendous financial stress. She states that she has no desire to eat. However her abdominal pain began before the anxiety started. Her last colonoscopy was performed less than 3 years ago. Her mammogram is scheduled for next week. Her last mammogram was one year ago and was normal. She is overdue for a bone density test. She is Prevnar 13 along with Pneumovax 23. Her most recent lab work is listed below: Lab on 08/07/2013  Component Date Value Ref Range Status  . Cholesterol 08/07/2013 131  0 - 200 mg/dL Final   Comment: ATP III Classification:                                < 200        mg/dL        Desirable                               200 - 239     mg/dL        Borderline High                               >= 240        mg/dL        High                             . Triglycerides 08/07/2013 68  <150 mg/dL Final  . HDL 08/07/2013 69  >39 mg/dL Final  . Total CHOL/HDL Ratio 08/07/2013 1.9   Final  . VLDL 08/07/2013 14  0 - 40 mg/dL Final  . LDL Cholesterol 08/07/2013 48  0 - 99 mg/dL Final   Comment:                            Total Cholesterol/HDL Ratio:CHD Risk                                                 Coronary Heart Disease Risk Table                                                                 Men       Women  1/2 Average Risk              3.4        3.3                                       Average Risk              5.0        4.4                     2X Average Risk              9.6        7.1                                    3X Average Risk             23.4       11.0                          Use the calculated Patient Ratio above and the CHD Risk table                           to determine the patient's CHD Risk.                          ATP III Classification (LDL):                                < 100        mg/dL         Optimal                               100 - 129     mg/dL         Near or Above Optimal                               130 - 159     mg/dL         Borderline High                               160 - 189     mg/dL         High                                > 190        mg/dL         Very High                             . WBC 08/07/2013 7.8  4.0 - 10.5 K/uL Final  . RBC 08/07/2013 4.75  3.87 - 5.11 MIL/uL Final  . Hemoglobin 08/07/2013 14.0  12.0 - 15.0 g/dL Final  . HCT 08/07/2013 41.7  36.0 -  46.0 % Final  . MCV 08/07/2013 87.8  78.0 - 100.0 fL Final  . MCH 08/07/2013 29.5  26.0 - 34.0 pg Final  . MCHC 08/07/2013 33.6  30.0 - 36.0 g/dL Final  . RDW 08/07/2013 14.3  11.5 - 15.5 % Final  . Platelets 08/07/2013 244  150 - 400 K/uL Final  . Neutrophils Relative % 08/07/2013 81* 43 - 77 % Final  . Neutro Abs 08/07/2013 6.3  1.7 - 7.7 K/uL Final  . Lymphocytes Relative 08/07/2013 9* 12 - 46 % Final  . Lymphs Abs 08/07/2013 0.7  0.7 - 4.0 K/uL Final  . Monocytes Relative 08/07/2013 8  3 - 12 % Final  . Monocytes Absolute 08/07/2013 0.6  0.1 - 1.0 K/uL Final  . Eosinophils Relative 08/07/2013 1  0 - 5 % Final  . Eosinophils Absolute 08/07/2013 0.1  0.0 - 0.7 K/uL Final  . Basophils Relative 08/07/2013 1  0 - 1 % Final  . Basophils Absolute 08/07/2013 0.1  0.0 - 0.1 K/uL Final  . Smear Review 08/07/2013 Criteria for review not met   Final  . TSH 08/07/2013 4.893* 0.350 - 4.500 uIU/mL Final  . Sodium 08/07/2013 143  135 - 145 mEq/L Final  . Potassium 08/07/2013 4.2  3.5 - 5.3 mEq/L Final  .  Chloride 08/07/2013 107  96 - 112 mEq/L Final  . CO2 08/07/2013 26  19 - 32 mEq/L Final  . Glucose, Bld 08/07/2013 113* 70 - 99 mg/dL Final  . BUN 08/07/2013 9  6 - 23 mg/dL Final  . Creat 08/07/2013 1.05  0.50 - 1.10 mg/dL Final  . Total Bilirubin 08/07/2013 0.5  0.2 - 1.2 mg/dL Final  . Alkaline Phosphatase 08/07/2013 67  39 - 117 U/L Final  . AST 08/07/2013 18  0 - 37 U/L Final  . ALT 08/07/2013 12  0 - 35 U/L Final  . Total Protein 08/07/2013 6.7  6.0 - 8.3 g/dL Final  . Albumin 08/07/2013 4.2  3.5 - 5.2 g/dL Final  . Calcium 08/07/2013 9.5  8.4 - 10.5 mg/dL Final  . GFR, Est African American 08/07/2013 63   Final  . GFR, Est Non African American 08/07/2013 55*  Final   Comment:                            The estimated GFR is a calculation valid for adults (>=46 years old)                          that uses the CKD-EPI algorithm to adjust for age and sex. It is                            not to be used for children, pregnant women, hospitalized patients,                             patients on dialysis, or with rapidly changing kidney function.                          According to the NKDEP, eGFR >89 is normal, 60-89 shows mild                          impairment, 30-59 shows moderate impairment,  15-29 shows severe                          impairment and <15 is ESRD.                             Orders Only on 08/07/2013  Component Date Value Ref Range Status  . Hemoglobin A1C 08/07/2013 5.9* <5.7 % Final   Comment:                                                                                                 According to the ADA Clinical Practice Recommendations for 2011, when                          HbA1c is used as a screening test:                                                       >=6.5%   Diagnostic of Diabetes Mellitus                                     (if abnormal result is confirmed)                                                     5.7-6.4%   Increased risk of  developing Diabetes Mellitus                                                     References:Diagnosis and Classification of Diabetes Mellitus,Diabetes                          ZOXW,9604,54(UJWJX 1):S62-S69 and Standards of Medical Care in                                  Diabetes - 2011,Diabetes BJYN,8295,62 (Suppl 1):S11-S61.                             . Mean Plasma Glucose 08/07/2013 123* <117 mg/dL Final   Past Medical History  Diagnosis Date  . Ischemic cardiomyopathy     a. 08/27/2011 Echo: EF 35-40%, mid-dist anteroseptal/apical akinesis, Gr 1 DD, mild-mod TR, PASP 39mHg  . Hypotension     a. preventing use of ACEI  . Diverticulosis   .  Hyperlipidemia     takes Crestor nightly  . Asthmatic bronchitis     "as a child; haven't had any since ~ I was 30 yr old" (06/08/2012)  . Anemia     "I've always been anemic; never had a transfusion" (06/08/2012)  . H/O hiatal hernia   . Oral cancer 2003    a. s/p radiation/chemotherapy  . Emphysema of lung   . GERD (gastroesophageal reflux disease)     takes Protonix daily  . Constipation     takes Miralax daily prn  . CAD (coronary artery disease)     a. 08/26/2011 Acute Ant STEMI/Cath/PCI: LM nl, LAD 100p -> 2.75x23 Xience Xpedition, LCX 20, minor irregs, RCA 20-37md, EF 30-35%, anterior/apical AK;  b. 05/2012 Cath: LM nl, LAD patent LAD stent, LCX 30ost, RCA dom, nl, EF 65%.  . Complication of anesthesia     slow to wake up  . Vertigo     occasionally  . Bruises easily     d/t taking Plavix and ASA  . History of colon polyps   . Dry mouth   . PONV (postoperative nausea and vomiting)   . STEMI (ST elevation myocardial infarction) 08/2011    anterior/notes 09/07/2011 (06/08/2012)   Past Surgical History  Procedure Laterality Date  . Bunionectomy Bilateral 1970's  . Shoulder open rotator cuff repair Left 1990's  . Tonsillectomy  ~ 1951  . Vaginal hysterectomy  ~ 1989    partial  . Tubal ligation  1975  . Coronary angioplasty with  stent placement  08/2011    "1" (06/08/2012)  . Cardiac catheterization  06/09/2012    patent prox LAD stent, minor nonobstructive LAD disease, 30% ostial LCx, and otherwise widely patent cors; LVEF 65%  . Colonoscopy    . Peg placement      in 2003 and then removed  . Cholecystectomy  08/18/2012  . Cholecystectomy N/A 08/18/2012    Procedure: LAPAROSCOPIC CHOLECYSTECTOMY WITH INTRAOPERATIVE CHOLANGIOGRAM;  Surgeon: MImogene Burn TGeorgette Dover MD;  [facility];  Service: General;  Laterality: N/A;   Current Outpatient Prescriptions on File Prior to Visit  Medication Sig Dispense Refill  . aspirin EC 81 MG tablet Take 1 tablet (81 mg total) by mouth daily.  90 tablet  3  . CRESTOR 20 MG tablet TAKE 1 TABLET BY MOUTH AT BEDTIME  30 tablet  0  . nitroGLYCERIN (NITROSTAT) 0.4 MG SL tablet Place 1 tablet (0.4 mg total) under the tongue every 5 (five) minutes x 3 doses as needed for chest pain.  30 tablet  3  . pantoprazole (PROTONIX) 20 MG tablet Take 1 tablet (20 mg total) by mouth at bedtime.  90 tablet  1   No current facility-administered medications on file prior to visit.   Allergies  Allergen Reactions  . Ampicillin Itching  . Codeine Itching and Nausea And Vomiting  . Nubain [Nalbuphine Hcl] Other (See Comments)    "headache & disoriented"     History   Social History  . Marital Status: Married    Spouse Name: N/A    Number of Children: N/A  . Years of Education: N/A   Occupational History  . Not on file.   Social History Main Topics  . Smoking status: Never Smoker   . Smokeless tobacco: Never Used  . Alcohol Use: No  . Drug Use: No  . Sexual Activity: No   Other Topics Concern  . Not on file   Social History Narrative  . No  narrative on file   Family History  Problem Relation Age of Onset  . Dementia Mother   . Heart disease Father   . Cancer Father     prostate  . Heart disease Paternal Grandfather       Review of Systems  All other systems reviewed and are  negative.      Objective:   Physical Exam  Vitals reviewed. Constitutional: She is oriented to person, place, and time. She appears well-developed and well-nourished. No distress.  HENT:  Head: Normocephalic and atraumatic.  Right Ear: External ear normal.  Left Ear: External ear normal.  Nose: Nose normal.  Mouth/Throat: Oropharynx is clear and moist. No oropharyngeal exudate.  Eyes: Conjunctivae and EOM are normal. Pupils are equal, round, and reactive to light. Right eye exhibits no discharge. Left eye exhibits no discharge. No scleral icterus.  Neck: Normal range of motion. Neck supple. No JVD present. No tracheal deviation present. No thyromegaly present.  Cardiovascular: Normal rate, regular rhythm, normal heart sounds and intact distal pulses.  Exam reveals no gallop and no friction rub.   No murmur heard. Pulmonary/Chest: Effort normal and breath sounds normal. No stridor. No respiratory distress. She has no wheezes. She has no rales. She exhibits no tenderness.  Abdominal: Soft. Bowel sounds are normal. She exhibits no distension and no mass. There is no tenderness. There is no rebound and no guarding.  Musculoskeletal: Normal range of motion. She exhibits no edema and no tenderness.  Lymphadenopathy:    She has no cervical adenopathy.  Neurological: She is alert and oriented to person, place, and time. She has normal reflexes. She displays normal reflexes. No cranial nerve deficit. She exhibits normal muscle tone. Coordination normal.  Skin: Skin is warm. No rash noted. She is not diaphoretic. No erythema. No pallor.  Psychiatric: She has a normal mood and affect. Her behavior is normal. Judgment and thought content normal.          Assessment & Plan:  1. Early satiety Patient has been doing with periodic epigastric abdominal discomfort now for almost one year. Some of the symptoms improved after her cholecystectomy however some persist. Now she is having early satiety  along with dysphagia and a sensation of food "sticking" in the stomach.  Therefore I will consult gastroenterology regarding a possible EGD to rule out esophageal stricture along with obstructive lesion in the stomach. - Ambulatory referral to Gastroenterology  2. Routine general medical examination at a health care facility Patient has no indication for a Pap smear as she has history of hysterectomy. Her mammogram and be performed next week. I scheduled patient for a bone density test. Her colonoscopy is up to date. The remainder of her lab work is excellent except for prediabetes. I recommended a low carbohydrate diet. Otherwise immunizations are up to date. - DG Bone Density; Future

## 2013-08-14 NOTE — Telephone Encounter (Signed)
Peter M Martinique, MD at 02/27/2013 12:31 PM rosuvastatin (CRESTOR) 20 MG tablet  Take 1 tablet (20 mg total) by mouth at bedtime.

## 2013-08-16 ENCOUNTER — Encounter: Payer: Self-pay | Admitting: Gastroenterology

## 2013-08-17 ENCOUNTER — Other Ambulatory Visit: Payer: Self-pay | Admitting: Cardiology

## 2013-08-17 ENCOUNTER — Ambulatory Visit
Admission: RE | Admit: 2013-08-17 | Discharge: 2013-08-17 | Disposition: A | Discharge status: Home / Self Care | Payer: Medicare HMO | Source: Ambulatory Visit

## 2013-08-17 DIAGNOSIS — Z1231 Encounter for screening mammogram for malignant neoplasm of breast: Secondary | ICD-10-CM

## 2013-09-06 ENCOUNTER — Encounter: Payer: Self-pay | Admitting: Cardiology

## 2013-09-06 ENCOUNTER — Ambulatory Visit (INDEPENDENT_AMBULATORY_CARE_PROVIDER_SITE_OTHER): Payer: Medicare HMO | Admitting: Cardiology

## 2013-09-06 VITALS — BP 116/77 | HR 80 | Ht <= 58 in | Wt 91.0 lb

## 2013-09-06 DIAGNOSIS — I209 Angina pectoris, unspecified: Secondary | ICD-10-CM

## 2013-09-06 DIAGNOSIS — I2510 Atherosclerotic heart disease of native coronary artery without angina pectoris: Secondary | ICD-10-CM

## 2013-09-06 DIAGNOSIS — I25118 Atherosclerotic heart disease of native coronary artery with other forms of angina pectoris: Secondary | ICD-10-CM

## 2013-09-06 DIAGNOSIS — E785 Hyperlipidemia, unspecified: Secondary | ICD-10-CM

## 2013-09-06 NOTE — Patient Instructions (Signed)
Continue your current therapy  I will see you in 6 months.   

## 2013-09-07 NOTE — Progress Notes (Signed)
Unk Lightning Date of Birth: 05/01/1945 Medical Record #[id number]  History of Present Illness:  [name] is seen back today for a follow up visit. She has known CAD with anterior MI in August of 2013 treated with DES of the proximal LAD. EF  recovered. Negative Myoview in October of 2013. Repeat cath 5/14 showed nonobstructive disease. Other issues include HLD and GERD. Has rare PVCs on past event monitor. Denies any current chest pain or SOB. She does complain of pain between her shoulder blades when she is working particularly if she is bending over. This is not at all like her heart pain which was anterior. She does complain of dysphagia with early satiety and is set up for upper EGD.    Current Outpatient Prescriptions on File Prior to Visit  Medication Sig Dispense Refill  . aspirin EC 81 MG tablet Take 1 tablet (81 mg total) by mouth daily.  90 tablet  3  . nitroGLYCERIN (NITROSTAT) 0.4 MG SL tablet Place 1 tablet (0.4 mg total) under the tongue every 5 (five) minutes x 3 doses as needed for chest pain.  30 tablet  3  . pantoprazole (PROTONIX) 20 MG tablet Take 1 tablet (20 mg total) by mouth at bedtime.  90 tablet  1  . rosuvastatin (CRESTOR) 20 MG tablet Take 1 tablet (20 mg total) by mouth daily.  90 tablet  3   No current facility-administered medications on file prior to visit.    Allergies  Allergen Reactions  . Ampicillin Itching  . Codeine Itching and Nausea And Vomiting  . Nubain [Nalbuphine Hcl] Other (See Comments)    "headache & disoriented"      Past Medical History  Diagnosis Date  . Ischemic cardiomyopathy     a. 08/27/2011 Echo: EF 35-40%, mid-dist anteroseptal/apical akinesis, Gr 1 DD, mild-mod TR, PASP 50mmHg  . Hypotension     a. preventing use of ACEI  . Diverticulosis   . Hyperlipidemia     takes Crestor nightly  . Asthmatic bronchitis     "as a child; haven't had any since ~ I was 8 yr old" (06/08/2012)  . Anemia     "I've always been anemic;  never had a transfusion" (06/08/2012)  . H/O hiatal hernia   . Oral cancer 2003    a. s/p radiation/chemotherapy  . Emphysema of lung   . GERD (gastroesophageal reflux disease)     takes Protonix daily  . Constipation     takes Miralax daily prn  . CAD (coronary artery disease)     a. 08/26/2011 Acute Ant STEMI/Cath/PCI: LM nl, LAD 100p -> 2.75x23 Xience Xpedition, LCX 20, minor irregs, RCA 20-56mid, EF 30-35%, anterior/apical AK;  b. 05/2012 Cath: LM nl, LAD patent LAD stent, LCX 30ost, RCA dom, nl, EF 65%.  . Complication of anesthesia     slow to wake up  . Vertigo     occasionally  . Bruises easily     d/t taking Plavix and ASA  . History of colon polyps   . Dry mouth   . PONV (postoperative nausea and vomiting)   . STEMI (ST elevation myocardial infarction) 08/2011    anterior/notes 09/07/2011 (06/08/2012)    Past Surgical History  Procedure Laterality Date  . Bunionectomy Bilateral 1970's  . Shoulder open rotator cuff repair Left 1990's  . Tonsillectomy  ~ 1951  . Vaginal hysterectomy  ~ 1989    partial  . Tubal ligation  1975  . Coronary angioplasty  with stent placement  08/2011    "1" (06/08/2012)  . Cardiac catheterization  06/09/2012    patent prox LAD stent, minor nonobstructive LAD disease, 30% ostial LCx, and otherwise widely patent cors; LVEF 65%  . Colonoscopy    . Peg placement      in 2003 and then removed  . Cholecystectomy  08/18/2012  . Cholecystectomy N/A 08/18/2012    Procedure: LAPAROSCOPIC CHOLECYSTECTOMY WITH INTRAOPERATIVE CHOLANGIOGRAM;  Surgeon: Imogene Burn. Georgette Dover, MD;  Location: [facility];  Service: General;  Laterality: N/A;    History  Smoking status  . Never Smoker   Smokeless tobacco  . Never Used    History  Alcohol Use No    Family History  Problem Relation Age of Onset  . Dementia Mother   . Heart disease Father   . Cancer Father     prostate  . Heart disease Paternal Grandfather     Review of Systems: The review of systems is per the  HPI.  All other systems were reviewed and are negative.  Physical Exam: BP 116/77  Pulse 80  Ht 4' 9.5" (1.461 m)  Wt 91 lb (41.277 kg)  BMI 19.34 kg/m2 Patient is very pleasant and in no acute distress. Skin is warm and dry. Color is normal.  HEENT is unremarkable. Normocephalic/atraumatic. PERRL. Sclera are nonicteric. Neck is supple. No masses. No JVD. Lungs are clear. Cardiac exam shows a regular rate and rhythm. Abdomen is soft. Extremities are without edema. Gait and ROM are intact. No gross neurologic deficits noted.  LABORATORY DATA:  Lab Results  Component Value Date   WBC 7.8 08/07/2013   HGB 14.0 08/07/2013   HCT 41.7 08/07/2013   PLT 244 08/07/2013   GLUCOSE 113* 08/07/2013   CHOL 131 08/07/2013   TRIG 68 08/07/2013   HDL 69 08/07/2013   LDLCALC 48 08/07/2013   ALT 12 08/07/2013   AST 18 08/07/2013   NA 143 08/07/2013   K 4.2 08/07/2013   CL 107 08/07/2013   CREATININE 1.05 08/07/2013   BUN 9 08/07/2013   CO2 26 08/07/2013   TSH 4.893* 08/07/2013   INR 1.03 06/08/2012   HGBA1C 5.9* 08/07/2013   Assessment / Plan: 1. CAD - s/p DES of LAD August 2013. Cath in May 2014 showed nonobstructive disease.Continue ASA 81 mg. Follow up in 6 months.  2. Past anterior MI in August of 2013 treated with DES of the proximal LAD. EF has recovered per last echo.  3. HLD - excellent control on statin.  4. Back pain most consistent with musculoskeletal pain.   5. Early satiety and dysphagia. For EGD.

## 2013-10-09 ENCOUNTER — Other Ambulatory Visit: Payer: Self-pay | Admitting: Cardiology

## 2013-10-10 ENCOUNTER — Ambulatory Visit (INDEPENDENT_AMBULATORY_CARE_PROVIDER_SITE_OTHER): Payer: Medicare HMO | Admitting: Family Medicine

## 2013-10-10 ENCOUNTER — Telehealth: Payer: Self-pay | Admitting: Cardiology

## 2013-10-10 ENCOUNTER — Other Ambulatory Visit: Payer: Self-pay

## 2013-10-10 DIAGNOSIS — Z23 Encounter for immunization: Secondary | ICD-10-CM

## 2013-10-10 MED ORDER — PANTOPRAZOLE SODIUM 20 MG PO TBEC: DELAYED_RELEASE_TABLET | ORAL | Status: DC

## 2013-10-10 NOTE — Telephone Encounter (Signed)
Need a refill on her Protonix.Please call to [facility].

## 2013-10-23 ENCOUNTER — Telehealth: Payer: Self-pay | Admitting: Gastroenterology

## 2013-10-23 ENCOUNTER — Ambulatory Visit: Payer: Medicare HMO | Admitting: Gastroenterology

## 2013-10-23 NOTE — Telephone Encounter (Signed)
Do not bill 

## 2013-11-23 ENCOUNTER — Telehealth: Payer: Self-pay | Admitting: *Deleted

## 2013-11-23 NOTE — Telephone Encounter (Signed)
Pt husband calling in reference to his wife Pamela Kidd, states that she is having changes in mental status and wanted to know if there was a referral for her to be seen to have her checked out. I informed pt that she needed appointment her first so that Provider can evaluate her and if feels like need sto be seen by specialist he will refer her. Appointment scheduled for Nov 10 at 12:15p.

## 2013-11-28 ENCOUNTER — Ambulatory Visit: Payer: Medicare HMO | Admitting: Family Medicine

## 2013-11-30 ENCOUNTER — Ambulatory Visit (INDEPENDENT_AMBULATORY_CARE_PROVIDER_SITE_OTHER): Payer: Medicare HMO | Admitting: Family Medicine

## 2013-11-30 ENCOUNTER — Encounter: Payer: Self-pay | Admitting: Family Medicine

## 2013-11-30 VITALS — BP 100/64 | HR 78 | Temp 98.6°F | Resp 16 | Ht <= 58 in | Wt 92.0 lb

## 2013-11-30 DIAGNOSIS — R413 Other amnesia: Secondary | ICD-10-CM

## 2013-11-30 NOTE — Progress Notes (Signed)
Subjective:    Patient ID: Unk Lightning, female    DOB: 1945/03/18, 68 y.o.   MRN: [id number]  HPI  Patient is  a very pleasant 68 year old Caucasian female.  She is accompanied today by her husband. He is concerned that she may have some of the first signs of dementia. Over the last several months they have noticed some mild memory loss.. Some of it is trivial. For instance she is having trouble remembering people's names. She frequently forgets information that he has told her earlier in the day.  However she is still able to balance her checkbook. She is able to manage his finances. She is not getting lost driving. He denies any personality changes. She denies any depression. On Mini-Mental status exam she is able to remember the month and the year. She is unable to remember the day or the date. She is able to answer 5 out of 5 items correct on location. She can easily remember 3 items. However she is unable to recall any of those items after a few minutes. She can spell world backwards. However she is unable to perform serial sevens at all. 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 The remainder of her Mini-Mental status exam is normal. However when I asked the patient to draw the face of a clock at 4:30 she drew a circle with a short hand on 4 and a long hand on "30" in the position of 6.  The patient was unable to add the numbers on the face appropriately spaced. She scored 26 out of 30 on Mini-Mental status exam. Of note she had an episode of fleeting substernal chest pressure this morning. It was unrelated to activity. It resolved spontaneously after just 1 or 2 minutes  Ear there is no shortness of breath. She denies any angina or shortness of breath. She does have a significant history of cardiovascular disease. I recommended that she contact her heart doctor in the morning and schedule a follow-up for possible further evaluation however I  believe the pain is atypical and likely reflects gastrointestinal source. Past Medical History  Diagnosis Date  . Ischemic cardiomyopathy     a. 08/27/2011 Echo: EF 35-40%, mid-dist anteroseptal/apical akinesis, Gr 1 DD, mild-mod TR, PASP 8mmHg  . Hypotension     a. preventing use of ACEI  . Diverticulosis   . Hyperlipidemia     takes Crestor nightly  . Asthmatic bronchitis     "as a child; haven't had any since ~ I was 58 yr old" (06/08/2012)  . Anemia     "I've always been anemic; never had a transfusion" (06/08/2012)  . H/O hiatal hernia   . Oral cancer 2003    a. s/p radiation/chemotherapy  . Emphysema of lung   . GERD (gastroesophageal reflux disease)     takes Protonix daily  . Constipation     takes Miralax daily prn  . CAD (coronary artery disease)     a. 08/26/2011 Acute Ant STEMI/Cath/PCI: LM nl, LAD 100p -> 2.75x23 Xience Xpedition, LCX 20, minor irregs, RCA 20-71mid, EF 30-35%, anterior/apical AK;  b. 05/2012 Cath: LM nl, LAD patent LAD stent, LCX 30ost, RCA dom, nl, EF 65%.  . Complication of anesthesia     slow to wake up  . Vertigo     occasionally  . Bruises easily     d/t taking Plavix and ASA  . History of colon polyps   .  Dry mouth   . PONV (postoperative nausea and vomiting)   . STEMI (ST elevation myocardial infarction) 08/2011    anterior/notes 09/07/2011 (06/08/2012)   Past Surgical History  Procedure Laterality Date  . Bunionectomy Bilateral 1970's  . Shoulder open rotator cuff repair Left 1990's  . Tonsillectomy  ~ 1951  . Vaginal hysterectomy  ~ 1989    partial  . Tubal ligation  1975  . Coronary angioplasty with stent placement  08/2011    "1" (06/08/2012)  . Cardiac catheterization  06/09/2012    patent prox LAD stent, minor nonobstructive LAD disease, 30% ostial LCx, and otherwise widely patent cors; LVEF 65%  . Colonoscopy    . Peg placement      in 2003 and then removed  . Cholecystectomy  08/18/2012  . Cholecystectomy N/A 08/18/2012    Procedure:  LAPAROSCOPIC CHOLECYSTECTOMY WITH INTRAOPERATIVE CHOLANGIOGRAM;  Surgeon: Imogene Burn. Georgette Dover, MD;  Location: [facility];  Service: General;  Laterality: N/A;   Current Outpatient Prescriptions on File Prior to Visit  Medication Sig Dispense Refill  . aspirin EC 81 MG tablet Take 1 tablet (81 mg total) by mouth daily. 90 tablet 3  . nitroGLYCERIN (NITROSTAT) 0.4 MG SL tablet Place 1 tablet (0.4 mg total) under the tongue every 5 (five) minutes x 3 doses as needed for chest pain. 30 tablet 3  . pantoprazole (PROTONIX) 20 MG tablet TAKE 1 TABLET BY MOUTH AT BEDTIME 90 tablet 0  . rosuvastatin (CRESTOR) 20 MG tablet Take 1 tablet (20 mg total) by mouth daily. 90 tablet 3   No current facility-administered medications on file prior to visit.   Allergies  Allergen Reactions  . Ampicillin Itching  . Codeine Itching and Nausea And Vomiting  . Nubain [Nalbuphine Hcl] Other (See Comments)    "headache & disoriented"     History   Social History  . Marital Status: Married    Spouse Name: N/A    Number of Children: N/A  . Years of Education: N/A   Occupational History  . Not on file.   Social History Main Topics  . Smoking status: Never Smoker   . Smokeless tobacco: Never Used  . Alcohol Use: No  . Drug Use: No  . Sexual Activity: No   Other Topics Concern  . Not on file   Social History Narrative     Review of Systems  All other systems reviewed and are negative.      Objective:   Physical Exam  Constitutional: She is oriented to person, place, and time. She appears well-developed and well-nourished. No distress.  HENT:  Right Ear: External ear normal.  Left Ear: External ear normal.  Nose: Nose normal.  Mouth/Throat: Oropharynx is clear and moist. No oropharyngeal exudate.  Eyes: Conjunctivae and EOM are normal. Pupils are equal, round, and reactive to light.  Neck: Neck supple.  Cardiovascular: Normal rate, regular rhythm and normal heart sounds.   No murmur  heard. Pulmonary/Chest: Effort normal and breath sounds normal. No respiratory distress. She has no wheezes. She has no rales.  Neurological: She is alert and oriented to person, place, and time. She has normal reflexes. She displays normal reflexes. No cranial nerve deficit. She exhibits normal muscle tone. Coordination normal.  Skin: She is not diaphoretic.  Vitals reviewed.         Assessment & Plan:  Memory loss or impairment  The patient has some mild memory loss. The question remains whether this is a progressive condition such  as dementia or just some mild memory impairment. I would like to monitor the patient closely for the next 2 months and repeat a Mini-Mental Status exam and formal evaluation in 2 months. If there is any sign of progression or worsening, however consider the patient for Aricept and possibly later Namenda.

## 2013-12-05 ENCOUNTER — Telehealth: Payer: Self-pay | Admitting: Cardiology

## 2013-12-05 NOTE — Telephone Encounter (Signed)
Pt wants to know if she can bring her handicap form by today and be signed?

## 2013-12-06 NOTE — Telephone Encounter (Signed)
Returned call to patient will have Dr.Jordan sign handicap parking form 12/08/13.Will call back when ready.

## 2013-12-06 NOTE — Telephone Encounter (Signed)
Returned call to patient 12/05/13

## 2013-12-11 NOTE — Telephone Encounter (Signed)
Returned call to patient handicap parking form signed by Dr.Jordan.Form mailed to patient.

## 2013-12-25 ENCOUNTER — Encounter: Payer: Self-pay | Admitting: Gastroenterology

## 2013-12-25 ENCOUNTER — Ambulatory Visit (INDEPENDENT_AMBULATORY_CARE_PROVIDER_SITE_OTHER): Payer: Medicare HMO | Admitting: Gastroenterology

## 2013-12-25 VITALS — BP 112/68 | HR 64 | Ht <= 58 in | Wt 91.4 lb

## 2013-12-25 DIAGNOSIS — R6881 Early satiety: Secondary | ICD-10-CM

## 2013-12-25 NOTE — Progress Notes (Signed)
HPI:  This is a very pleasant  68 year old woman whom I'm meeting for the first time today. She is here with her husband today.   No family history of gastric cancer  Early satiety, feels full all the time.    Over the past year she's lost several pounds (5-10 pounds).  She takes advil about 2-3 times per week.  Had G tube 2003  No abd pains.       Review of systems: Pertinent positive and negative review of systems were noted in the above HPI section. Complete review of systems was performed and was otherwise normal.    Past Medical History  Diagnosis Date  . Ischemic cardiomyopathy     a. 08/27/2011 Echo: EF 35-40%, mid-dist anteroseptal/apical akinesis, Gr 1 DD, mild-mod TR, PASP 55mmHg  . Hypotension     a. preventing use of ACEI  . Diverticulosis   . Hyperlipidemia     takes Crestor nightly  . Asthmatic bronchitis     "as a child; haven't had any since ~ I was 45 yr old" (06/08/2012)  . Anemia     "I've always been anemic; never had a transfusion" (06/08/2012)  . H/O hiatal hernia   . Oral cancer 2003    a. s/p radiation/chemotherapy  . Emphysema of lung   . GERD (gastroesophageal reflux disease)     takes Protonix daily  . Constipation     takes Miralax daily prn  . CAD (coronary artery disease)     a. 08/26/2011 Acute Ant STEMI/Cath/PCI: LM nl, LAD 100p -> 2.75x23 Xience Xpedition, LCX 20, minor irregs, RCA 20-76mid, EF 30-35%, anterior/apical AK;  b. 05/2012 Cath: LM nl, LAD patent LAD stent, LCX 30ost, RCA dom, nl, EF 65%.  . Complication of anesthesia     slow to wake up  . Vertigo     occasionally  . Bruises easily     d/t taking Plavix and ASA  . History of colon polyps   . Dry mouth   . PONV (postoperative nausea and vomiting)   . STEMI (ST elevation myocardial infarction) 08/2011    anterior/notes 09/07/2011 (06/08/2012)    Past Surgical History  Procedure Laterality Date  . Bunionectomy Bilateral 1970's  . Shoulder open rotator cuff repair Left  1990's  . Tonsillectomy  ~ 1951  . Vaginal hysterectomy  ~ 1989    partial  . Tubal ligation  1975  . Coronary angioplasty with stent placement  08/2011    "1" (06/08/2012)  . Cardiac catheterization  06/09/2012    patent prox LAD stent, minor nonobstructive LAD disease, 30% ostial LCx, and otherwise widely patent cors; LVEF 65%  . Colonoscopy    . Peg placement      in 2003 and then removed  . Cholecystectomy  08/18/2012  . Cholecystectomy N/A 08/18/2012    Procedure: LAPAROSCOPIC CHOLECYSTECTOMY WITH INTRAOPERATIVE CHOLANGIOGRAM;  Surgeon: Imogene Burn. Georgette Dover, MD;  Location: [facility];  Service: General;  Laterality: N/A;    Current Outpatient Prescriptions  Medication Sig Dispense Refill  . aspirin EC 81 MG tablet Take 1 tablet (81 mg total) by mouth daily. 90 tablet 3  . pantoprazole (PROTONIX) 20 MG tablet TAKE 1 TABLET BY MOUTH AT BEDTIME 90 tablet 0  . rosuvastatin (CRESTOR) 20 MG tablet Take 1 tablet (20 mg total) by mouth daily. 90 tablet 3  . nitroGLYCERIN (NITROSTAT) 0.4 MG SL tablet Place 1 tablet (0.4 mg total) under the tongue every 5 (five) minutes x 3 doses as  needed for chest pain. 30 tablet 3   No current facility-administered medications for this visit.    Allergies as of 12/25/2013 - Review Complete 12/25/2013  Allergen Reaction Noted  . Ampicillin Itching 08/26/2011  . Codeine Itching and Nausea And Vomiting 08/26/2011  . Nubain [nalbuphine hcl] Other (See Comments) 08/26/2011    Family History  Problem Relation Age of Onset  . Dementia Mother   . Heart disease Father   . Cancer Father     prostate  . Heart disease Paternal Grandfather     History   Social History  . Marital Status: Married    Spouse Name: N/A    Number of Children: 4  . Years of Education: N/A   Occupational History  . Retired    Social History Main Topics  . Smoking status: Never Smoker   . Smokeless tobacco: Never Used  . Alcohol Use: No  . Drug Use: No  . Sexual Activity: No    Other Topics Concern  . Not on file   Social History Narrative       Physical Exam: BP 112/68 mmHg  Pulse 64  Ht 4' 8.89" (1.445 m)  Wt 91 lb 6 oz (41.447 kg)  BMI 19.85 kg/m2 Constitutional: generally well-appearing Psychiatric: alert and oriented x3 Eyes: extraocular movements intact Mouth: oral pharynx moist, no lesions Neck: supple no lymphadenopathy Cardiovascular: heart regular rate and rhythm Lungs: clear to auscultation bilaterally Abdomen: soft, nontender, nondistended, no obvious ascites, no peritoneal signs, normal bowel sounds Extremities: no lower extremity edema bilaterally Skin: no lesions on visible extremities    Assessment and plan: 68 y.o. female with   Early satiety, +/- weight loss   Will proceed with EGD at her soonest convenience.

## 2013-12-25 NOTE — Patient Instructions (Signed)
You will be set up for an upper endoscopy for early satiety.

## 2013-12-28 ENCOUNTER — Encounter (HOSPITAL_COMMUNITY): Payer: Self-pay | Admitting: Cardiology

## 2014-01-05 ENCOUNTER — Encounter: Payer: Self-pay | Admitting: Gastroenterology

## 2014-01-13 ENCOUNTER — Other Ambulatory Visit: Payer: Self-pay | Admitting: Cardiology

## 2014-01-15 NOTE — Telephone Encounter (Signed)
Rx refill sent to patient pharmacy   

## 2014-02-15 ENCOUNTER — Ambulatory Visit: Payer: Medicare HMO | Admitting: Family Medicine

## 2014-02-27 ENCOUNTER — Ambulatory Visit (AMBULATORY_SURGERY_CENTER): Payer: Commercial Managed Care - HMO | Admitting: Gastroenterology

## 2014-02-27 ENCOUNTER — Telehealth: Payer: Self-pay | Admitting: Gastroenterology

## 2014-02-27 ENCOUNTER — Encounter: Payer: Self-pay | Admitting: Gastroenterology

## 2014-02-27 VITALS — BP 100/58 | HR 53 | Temp 98.4°F | Resp 19 | Ht <= 58 in | Wt 91.0 lb

## 2014-02-27 DIAGNOSIS — K317 Polyp of stomach and duodenum: Secondary | ICD-10-CM

## 2014-02-27 DIAGNOSIS — R6881 Early satiety: Secondary | ICD-10-CM

## 2014-02-27 DIAGNOSIS — K2990 Gastroduodenitis, unspecified, without bleeding: Secondary | ICD-10-CM

## 2014-02-27 DIAGNOSIS — K319 Disease of stomach and duodenum, unspecified: Secondary | ICD-10-CM

## 2014-02-27 DIAGNOSIS — K2970 Gastritis, unspecified, without bleeding: Secondary | ICD-10-CM

## 2014-02-27 MED ORDER — SODIUM CHLORIDE 0.9 % IV SOLN: 500.0000 mL | INTRAVENOUS | Status: DC

## 2014-02-27 MED ORDER — PANTOPRAZOLE SODIUM 40 MG PO TBEC
40.0000 mg | DELAYED_RELEASE_TABLET | Freq: Every day | ORAL | Status: DC
Start: 2014-02-27 — End: 2015-05-02

## 2014-02-27 MED ORDER — SUCRALFATE 1 GM/10ML PO SUSP: 1.0000 g | Freq: Two times a day (BID) | ORAL | Status: DC

## 2014-02-27 NOTE — Progress Notes (Signed)
Report to PACU, RN, vss, BBS= Clear.  

## 2014-02-27 NOTE — Patient Instructions (Signed)
Discharge instructions given. Biopsies taken. Resume previous medications. YOU HAD AN ENDOSCOPIC PROCEDURE TODAY AT [facility]: Refer to the procedure report that was given to you for any specific questions about what was found during the examination.  If the procedure report does not answer your questions, please call your gastroenterologist to clarify.  If you requested that your care partner not be given the details of your procedure findings, then the procedure report has been included in a sealed envelope for you to review at your convenience later.  YOU SHOULD EXPECT: Some feelings of bloating in the abdomen. Passage of more gas than usual.  Walking can help get rid of the air that was put into your GI tract during the procedure and reduce the bloating. If you had a lower endoscopy (such as a colonoscopy or flexible sigmoidoscopy) you may notice spotting of blood in your stool or on the toilet paper. If you underwent a bowel prep for your procedure, then you may not have a normal bowel movement for a few days.  DIET: Your first meal following the procedure should be a light meal and then it is ok to progress to your normal diet.  A half-sandwich or bowl of soup is an example of a good first meal.  Heavy or fried foods are harder to digest and may make you feel nauseous or bloated.  Likewise meals heavy in dairy and vegetables can cause extra gas to form and this can also increase the bloating.  Drink plenty of fluids but you should avoid alcoholic beverages for 24 hours.  ACTIVITY: Your care partner should take you home directly after the procedure.  You should plan to take it easy, moving slowly for the rest of the day.  You can resume normal activity the day after the procedure however you should NOT DRIVE or use heavy machinery for 24 hours (because of the sedation medicines used during the test).    SYMPTOMS TO REPORT IMMEDIATELY: A gastroenterologist can be reached at any  hour.  During normal business hours, 8:30 AM to 5:00 PM Monday through Friday, call [phone].  After hours and on weekends, please call the GI answering service at [phone] who will take a message and have the physician on call contact you.   Following upper endoscopy (EGD)  Vomiting of blood or coffee ground material  New chest pain or pain under the shoulder blades  Painful or persistently difficult swallowing  New shortness of breath  Fever of 100F or higher  Black, tarry-looking stools  FOLLOW UP: If any biopsies were taken you will be contacted by phone or by letter within the next 1-3 weeks.  Call your gastroenterologist if you have not heard about the biopsies in 3 weeks.  Our staff will call the home number listed on your records the next business day following your procedure to check on you and address any questions or concerns that you may have at that time regarding the information given to you following your procedure. This is a courtesy call and so if there is no answer at the home number and we have not heard from you through the emergency physician on call, we will assume that you have returned to your regular daily activities without incident.  SIGNATURES/CONFIDENTIALITY: You and/or your care partner have signed paperwork which will be entered into your electronic medical record.  These signatures attest to the fact that that the information above on your After Visit Summary has  been reviewed and is understood.  Full responsibility of the confidentiality of this discharge information lies with you and/or your care-partner.

## 2014-02-27 NOTE — Progress Notes (Signed)
Called to room to assist during endoscopic procedure.  Patient ID and intended procedure confirmed with present staff. Received instructions for my participation in the procedure from the performing physician.  

## 2014-02-27 NOTE — Op Note (Signed)
[facility]  [address] [place], [id number]   ENDOSCOPY PROCEDURE REPORT  PATIENT: Pamela Kidd, Pamela Kidd  MR#: [id number] BIRTHDATE: Jan 02, 1946 , 1  yrs. old GENDER: female ENDOSCOPIST: Milus Banister, MD REFERRED BY:  Jenna Luo, M.D. PROCEDURE DATE:  02/27/2014 PROCEDURE:  EGD w/ biopsy ASA CLASS:     Class II INDICATIONS:  Early satiety, +/- weight loss. MEDICATIONS: Monitored anesthesia care and Propofol 100 mg IV TOPICAL ANESTHETIC: none  DESCRIPTION OF PROCEDURE: After the risks benefits and alternatives of the procedure were thoroughly explained, informed consent was obtained.  The LB [id number] endoscope was introduced through the mouth and advanced to the second portion of the duodenum , Without limitations.  The instrument was slowly withdrawn as the mucosa was fully examined.  There were several sites of pinpoint oozing of blood in the stomach and small amount of hematin within.  This was in the setting of moderate pan-gastritis.  The distal stomach was biopsied and sent to pathology.  There was a slowly oozing 87mm soft fleshy polyp in mid stomach, this was removed with forceps/cautery and also sent to pathology.  The examination was otherwise normal.  Retroflexed views revealed no abnormalities.     The scope was then withdrawn from the patient and the procedure completed.  COMPLICATIONS: There were no immediate complications.  ENDOSCOPIC IMPRESSION: There were several sites of pinpoint oozing of blood in the stomach and small amount of hematin within.  This was in the setting of moderate pan-gastritis.  The distal stomach was biopsied and sent to pathology.  There was a slowly oozing 19mm soft fleshy polyp in mid stomach, this was removed with forceps/cautery and also sent to pathology.  The examination was otherwise normal  RECOMMENDATIONS: Will increase her protonix to 40mg  once daily. Will also start twice daily carafate.  Await  final pathology. If biopsies show H. pylori, she will be started on appropriate antibiotics.   eSigned:  Milus Banister, MD 02/27/2014 10:18 AM

## 2014-02-28 ENCOUNTER — Telehealth: Payer: Self-pay | Admitting: *Deleted

## 2014-02-28 ENCOUNTER — Telehealth: Payer: Self-pay | Admitting: Cardiology

## 2014-02-28 NOTE — Telephone Encounter (Signed)
Pt says she can not afford Sucralfate,pt would like something else.

## 2014-02-28 NOTE — Telephone Encounter (Signed)
Returned call to patient no answer.Left message on personal voice mail call PCP.

## 2014-02-28 NOTE — Telephone Encounter (Signed)
Received fax from [facility] care mgmt for authorization for [place] for procedure 43235- Egd Diagnostic Brush wash authorization number is [id number]  Requesting provider: Owens Loffler MD  Treating provider: [place] of service: [place]  Procedure: [place] Diagnostic Brush   Number of visits: 1  Start Date: 02/27/14- end date 08/26/14  Dx: R68.81- Early Satiety  Dr. Ardis Hughs aware of authorization

## 2014-02-28 NOTE — Telephone Encounter (Signed)
No answer prior Josem Kaufmann will be done

## 2014-02-28 NOTE — Telephone Encounter (Signed)
  Follow up Call-  Call back number 02/27/2014  Post procedure Call Back phone  # [phone] or [phone]  Permission to leave phone message Yes     Patient questions:  Do you have a fever, pain , or abdominal swelling? No. Pain Score  0 *  Have you tolerated food without any problems? Yes.    Have you been able to return to your normal activities? Yes.    Do you have any questions about your discharge instructions: Diet   No. Medications  No. Follow up visit  No.  Do you have questions or concerns about your Care? No. patient cannot afford medication that was prescribed yesterday, phone note was already sent to [facility] about this.   Actions: * If pain score is 4 or above: No action needed, pain <4.

## 2014-02-28 NOTE — Telephone Encounter (Signed)
Prior auth not needed I spoke with the pharmacy and they will notifiy the pt

## 2014-03-01 ENCOUNTER — Telehealth: Payer: Self-pay | Admitting: Gastroenterology

## 2014-03-01 NOTE — Telephone Encounter (Signed)
The pt states that she feels better today and will call back if symptoms return.

## 2014-03-05 ENCOUNTER — Ambulatory Visit: Payer: Medicare HMO | Admitting: Cardiology

## 2014-03-09 ENCOUNTER — Encounter: Payer: Self-pay | Admitting: Cardiology

## 2014-03-09 ENCOUNTER — Ambulatory Visit (INDEPENDENT_AMBULATORY_CARE_PROVIDER_SITE_OTHER): Payer: Commercial Managed Care - HMO | Admitting: Cardiology

## 2014-03-09 VITALS — BP 110/70 | HR 73

## 2014-03-09 DIAGNOSIS — I252 Old myocardial infarction: Secondary | ICD-10-CM

## 2014-03-09 DIAGNOSIS — E7800 Pure hypercholesterolemia, unspecified: Secondary | ICD-10-CM

## 2014-03-09 DIAGNOSIS — I25118 Atherosclerotic heart disease of native coronary artery with other forms of angina pectoris: Secondary | ICD-10-CM

## 2014-03-09 DIAGNOSIS — R55 Syncope and collapse: Secondary | ICD-10-CM

## 2014-03-09 NOTE — Addendum Note (Signed)
Addended by: Golden Hurter D on: 03/09/2014 11:34 AM   Modules accepted: Orders

## 2014-03-09 NOTE — Patient Instructions (Signed)
We will have your wear an event monitor to see if your rhythm is abnormal  We will schedule you for a stress test  Continue your current medication

## 2014-03-09 NOTE — Progress Notes (Signed)
[place] Date of Birth: 06-Oct-1945 Medical Record #[id number]  History of Present Illness:  [name] is seen back today for a follow up visit. She has known CAD with anterior MI in August of 2013 treated with DES of the proximal LAD. EF  recovered. Negative Myoview in October of 2013. Repeat cath 5/14 showed nonobstructive disease. Other issues include HLD and GERD. Has rare PVCs on past event monitor. She did undergo EGD in February with findings of pan gastritis. Biopsies were negative. She is on PPI. On follow up today she complains of intermittent episodes of almost blacking out. This typically occurs with exertion-walking thru her house. It starts with a sense of fullness in her chest and SOB. No pain. She has to stop. She never lost consciousness. Husband states pulse is irregular during these episodes.    Current Outpatient Prescriptions on File Prior to Visit  Medication Sig Dispense Refill  . aspirin EC 81 MG tablet Take 1 tablet (81 mg total) by mouth daily. 90 tablet 3  . nitroGLYCERIN (NITROSTAT) 0.4 MG SL tablet Place 1 tablet (0.4 mg total) under the tongue every 5 (five) minutes x 3 doses as needed for chest pain. 30 tablet 3  . pantoprazole (PROTONIX) 40 MG tablet Take 1 tablet (40 mg total) by mouth daily before breakfast. 90 tablet 3  . rosuvastatin (CRESTOR) 20 MG tablet Take 1 tablet (20 mg total) by mouth daily. 90 tablet 3  . sucralfate (CARAFATE) 1 GM/10ML suspension Take 10 mLs (1 g total) by mouth 2 (two) times daily. 420 mL 6   No current facility-administered medications on file prior to visit.    Allergies  Allergen Reactions  . Ampicillin Itching  . Codeine Itching and Nausea And Vomiting  . Nubain [Nalbuphine Hcl] Other (See Comments)    "headache & disoriented"      Past Medical History  Diagnosis Date  . Ischemic cardiomyopathy     a. 08/27/2011 Echo: EF 35-40%, mid-dist anteroseptal/apical akinesis, Gr 1 DD, mild-mod TR, PASP 62mmHg  .  Hypotension     a. preventing use of ACEI  . Diverticulosis   . Hyperlipidemia     takes Crestor nightly  . Asthmatic bronchitis     "as a child; haven't had any since ~ I was 47 yr old" (06/08/2012)  . Anemia     "I've always been anemic; never had a transfusion" (06/08/2012)  . H/O hiatal hernia   . Oral cancer 2003    a. s/p radiation/chemotherapy  . Emphysema of lung   . GERD (gastroesophageal reflux disease)     takes Protonix daily  . Constipation     takes Miralax daily prn  . CAD (coronary artery disease)     a. 08/26/2011 Acute Ant STEMI/Cath/PCI: LM nl, LAD 100p -> 2.75x23 Xience Xpedition, LCX 20, minor irregs, RCA 20-81mid, EF 30-35%, anterior/apical AK;  b. 05/2012 Cath: LM nl, LAD patent LAD stent, LCX 30ost, RCA dom, nl, EF 65%.  . Complication of anesthesia     slow to wake up  . Vertigo     occasionally  . Bruises easily     d/t taking Plavix and ASA  . History of colon polyps   . Dry mouth   . PONV (postoperative nausea and vomiting)   . STEMI (ST elevation myocardial infarction) 08/2011    anterior/notes 09/07/2011 (06/08/2012)  . Gastritis     Past Surgical History  Procedure Laterality Date  . Bunionectomy Bilateral 1970's  .  Shoulder open rotator cuff repair Left 1990's  . Tonsillectomy  ~ 1951  . Vaginal hysterectomy  ~ 1989    partial  . Tubal ligation  1975  . Coronary angioplasty with stent placement  08/2011    "1" (06/08/2012)  . Cardiac catheterization  06/09/2012    patent prox LAD stent, minor nonobstructive LAD disease, 30% ostial LCx, and otherwise widely patent cors; LVEF 65%  . Colonoscopy    . Peg placement      in 2003 and then removed  . Cholecystectomy  08/18/2012  . Cholecystectomy N/A 08/18/2012    Procedure: LAPAROSCOPIC CHOLECYSTECTOMY WITH INTRAOPERATIVE CHOLANGIOGRAM;  Surgeon: Imogene Burn. Georgette Dover, MD;  Location: [place];  Service: General;  Laterality: N/A;  . Left heart catheterization with coronary angiogram N/A 08/26/2011    Procedure:  LEFT HEART CATHETERIZATION WITH CORONARY ANGIOGRAM;  Surgeon: Peter M Martinique, MD;  Location: [facility] CATH LAB;  Service: Cardiovascular;  Laterality: N/A;  . Percutaneous coronary stent intervention (pci-s) N/A 08/26/2011    Procedure: PERCUTANEOUS CORONARY STENT INTERVENTION (PCI-S);  Surgeon: Peter M Martinique, MD;  Location: [facility] CATH LAB;  Service: Cardiovascular;  Laterality: N/A;  . Left heart catheterization with coronary angiogram N/A 06/09/2012    Procedure: LEFT HEART CATHETERIZATION WITH CORONARY ANGIOGRAM;  Surgeon: Sherren Mocha, MD;  Location: [facility] CATH LAB;  Service: Cardiovascular;  Laterality: N/A;    History  Smoking status  . Never Smoker   Smokeless tobacco  . Never Used    History  Alcohol Use No    Family History  Problem Relation Age of Onset  . Dementia Mother   . Heart disease Father   . Cancer Father     prostate  . Heart disease Paternal Grandfather   . Colon cancer Neg Hx     Review of Systems: The review of systems is per the HPI.  All other systems were reviewed and are negative.  Physical Exam: BP 110/70 mmHg  Pulse 73 BP 110/70 supine, 110/80 sitting, 106/74 standing.  Patient is very pleasant and in no acute distress. Skin is warm and dry. Color is normal.  HEENT is unremarkable. Normocephalic/atraumatic. PERRL. Sclera are nonicteric. Neck is supple. No masses. No JVD. Lungs are clear. Cardiac exam shows a regular rate and rhythm. Abdomen is soft. Extremities are without edema. Gait and ROM are intact. No gross neurologic deficits noted.  LABORATORY DATA:  Lab Results  Component Value Date   WBC 7.8 08/07/2013   HGB 14.0 08/07/2013   HCT 41.7 08/07/2013   PLT 244 08/07/2013   GLUCOSE 113* 08/07/2013   CHOL 131 08/07/2013   TRIG 68 08/07/2013   HDL 69 08/07/2013   LDLCALC 48 08/07/2013   ALT 12 08/07/2013   AST 18 08/07/2013   NA 143 08/07/2013   K 4.2 08/07/2013   CL 107 08/07/2013   CREATININE 1.05 08/07/2013   BUN 9 08/07/2013   CO2 26  08/07/2013   TSH 4.893* 08/07/2013   INR 1.03 06/08/2012   HGBA1C 5.9* 08/07/2013   Ecg today shows NSR with low voltage. No acute change. I have personally reviewed and interpreted this study.   Assessment / Plan: 1. CAD - s/p DES of LAD August 2013. Cath in May 2014 showed nonobstructive disease. Now with symptoms of chest fullness and SOB with exertion- will update a stress Myoview. Continue ASA 81 mg.   2. Past anterior MI in August of 2013 treated with DES of the proximal LAD. EF has recovered per last  echo.  3. HLD - excellent control on statin.  4. Near syncope. Need to rule out arrhythmia. Event monitor in 2013 showed only PVCs. Will repeat event monitor. She is not orthostatic on exam and is currently on no antihypertensives.   5. Pan gastritis.

## 2014-03-19 ENCOUNTER — Telehealth: Payer: Self-pay | Admitting: *Deleted

## 2014-03-19 NOTE — Telephone Encounter (Signed)
Received [employer] silverback referral with authorization number [id number] to Acute care hospital [facility]  Requesting provider: Peter Martinique, MD  Treating provider: [facility]   Place of service: [facility]  PCP: Cammie Mcgee. Dennard Schaumann, MD  Type of service: NUCLR  Procedure: 81191-YN Muscle image Spect Mult  Number of visits: 1  Start Date 03/19/14-end date 09/15/2014  Dx: R06.09-other forms of dyspnea  Copy has been sent to [facility] cone and Dr. Peter Martinique

## 2014-03-20 ENCOUNTER — Encounter (INDEPENDENT_AMBULATORY_CARE_PROVIDER_SITE_OTHER): Payer: Commercial Managed Care - HMO

## 2014-03-20 ENCOUNTER — Encounter: Payer: Self-pay | Admitting: *Deleted

## 2014-03-20 ENCOUNTER — Ambulatory Visit (HOSPITAL_COMMUNITY): Payer: Commercial Managed Care - HMO | Attending: Cardiology | Admitting: Radiology

## 2014-03-20 DIAGNOSIS — E7800 Pure hypercholesterolemia, unspecified: Secondary | ICD-10-CM

## 2014-03-20 DIAGNOSIS — I25118 Atherosclerotic heart disease of native coronary artery with other forms of angina pectoris: Secondary | ICD-10-CM

## 2014-03-20 DIAGNOSIS — R55 Syncope and collapse: Secondary | ICD-10-CM | POA: Diagnosis not present

## 2014-03-20 DIAGNOSIS — I252 Old myocardial infarction: Secondary | ICD-10-CM | POA: Insufficient documentation

## 2014-03-20 MED ORDER — TECHNETIUM TC 99M SESTAMIBI GENERIC - CARDIOLITE
11.0000 | Freq: Once | INTRAVENOUS | Status: AC | PRN
  Administered 2014-03-20: 11 via INTRAVENOUS

## 2014-03-20 MED ORDER — TECHNETIUM TC 99M SESTAMIBI GENERIC - CARDIOLITE
33.0000 | Freq: Once | INTRAVENOUS | Status: AC | PRN
  Administered 2014-03-20: 33 via INTRAVENOUS

## 2014-03-20 MED ORDER — AMINOPHYLLINE 25 MG/ML IV SOLN
75.0000 mg | Freq: Once | INTRAVENOUS | Status: AC
  Administered 2014-03-20: 75 mg via INTRAVENOUS

## 2014-03-20 MED ORDER — REGADENOSON 0.4 MG/5ML IV SOLN
0.4000 mg | Freq: Once | INTRAVENOUS | Status: AC
  Administered 2014-03-20: 0.4 mg via INTRAVENOUS

## 2014-03-20 NOTE — Progress Notes (Signed)
Patient ID: Pamela Kidd, female   DOB: 11/05/1945, 69 y.o.   MRN: [id number] [employer] verite 30 day cardiac event monitor applied to patient.

## 2014-03-20 NOTE — Progress Notes (Signed)
[facility] [address] [place], [place] [address] [phone]    Cardiology Nuclear Med Study  Pamela Kidd is a 69 y.o. female     MRN : [id number]     DOB: November 05, 1945  Procedure Date: 03/20/2014  Nuclear Med Background Indication for Stress Test:  Evaluation for Ischemia and Stent Patency History:  Emphysema and CAD,Stent, 11-10-2011 MPI: EF: 80% NL, ICM VT Cardiac Risk Factors: Family History - CAD and Lipids  Symptoms:  Chest Pain, DOE, Near Syncope, Palpitations and SOB   Nuclear Pre-Procedure Caffeine/Decaff Intake:  None> 12 hrs NPO After: 6:00pm   Lungs:  clear O2 Sat: 98% on room air. IV 0.9% NS with Angio Cath:  22g  IV Site: R Forearm x 1, tolerated well IV Started by:  Irven Baltimore, RN  Chest Size (in):  36 Cup Size: B  Height: 4\' 10"  (1.473 m)  Weight:  89 lb (40.37 kg)  BMI:  Body mass index is 18.61 kg/(m^2). Tech Comments:  Aminophylline 75 mg IV given for symptoms. All were resolved before leaving. Pt given 250 cc NS fluid bolus for Hypotension during the test. S.Williams EMTP    Nuclear Med Study 1 or 2 day study: 1 day  Stress Test Type:  Carlton Adam  Reading MD: N/A  Order Authorizing Provider:  Peter Martinique, MD  Resting Radionuclide: Technetium 2m Sestamibi  Resting Radionuclide Dose: 11.0 mCi   Stress Radionuclide:  Technetium 47m Sestamibi  Stress Radionuclide Dose: 33.0 mCi           Stress Protocol Rest HR: 69 Stress HR: 107  Rest BP: 133/84 Stress BP: 145/96  Exercise Time (min): n/a METS: n/a   Predicted Max HR: 152 bpm % Max HR: 70.39 bpm Rate Pressure Product: 14231   Dose of Adenosine (mg):  n/a Dose of Lexiscan: 0.4 mg  Dose of Atropine (mg): n/a Dose of Dobutamine: n/a mcg/kg/min (at max HR)  Stress Test Technologist: Perrin Maltese, EMT-P  Nuclear Technologist:  Earl Many, CNMT     Rest Procedure:  Myocardial perfusion imaging was performed at rest 45 minutes following the intravenous  administration of Technetium 48m Sestamibi. Rest ECG: NSR - Normal EKG  Stress Procedure:  The patient received IV Lexiscan 0.4 mg over 15-seconds.  Technetium 29m Sestamibi injected at 30-seconds. This patient had sob, abdominal pain, and pre-syncope with the Lexiscan injection. Quantitative spect images were obtained after a 45 minute delay. Stress ECG: No significant change from baseline ECG  QPS Raw Data Images:  Normal; no motion artifact; normal heart/lung ratio. Stress Images:  There is decreased uptake in the septum. Rest Images:  There is decreased uptake in the septum. Subtraction (SDS):  Fixed septal defect Transient Ischemic Dilatation (Normal <1.22):  1.12 Lung/Heart Ratio (Normal <0.45):  0.35  Quantitative Gated Spect Images QGS EDV:  36 ml QGS ESV:  8 ml  Impression Exercise Capacity:  Lexiscan with no exercise. BP Response:  Hypotensive blood pressure response. Clinical Symptoms:  dyspnea, abdominal pain, pre-syncope ECG Impression:  No significant ECG changes with Lexiscan. Comparison with Prior Nuclear Study: Prior study in 2013 shows normal perfusion  Overall Impression:  Low risk stress nuclear study with moderate size and intensity, predominantly fixed septal defect, no significant reversible ischemia.  LV Ejection Fraction: 78%.  LV Wall Motion:  Septal dysynchrony   Pixie Casino, MD, [facility] Board Certified in Nuclear Cardiology Attending Cardiologist [place]

## 2014-04-06 ENCOUNTER — Encounter: Payer: Self-pay | Admitting: Family Medicine

## 2014-04-06 ENCOUNTER — Ambulatory Visit (INDEPENDENT_AMBULATORY_CARE_PROVIDER_SITE_OTHER): Payer: Commercial Managed Care - HMO | Admitting: Family Medicine

## 2014-04-06 VITALS — BP 86/62 | HR 74 | Temp 98.4°F | Resp 16 | Ht <= 58 in | Wt 90.0 lb

## 2014-04-06 DIAGNOSIS — R6881 Early satiety: Secondary | ICD-10-CM | POA: Diagnosis not present

## 2014-04-06 DIAGNOSIS — R739 Hyperglycemia, unspecified: Secondary | ICD-10-CM

## 2014-04-06 DIAGNOSIS — E785 Hyperlipidemia, unspecified: Secondary | ICD-10-CM | POA: Diagnosis not present

## 2014-04-06 LAB — LIPID PANEL
Cholesterol: 138 mg/dL (ref 0–200)
HDL: 64 mg/dL (ref >=46)
LDL Cholesterol: 58 mg/dL (ref 0–99)
Total CHOL/HDL Ratio: 2.2 Ratio
Triglycerides: 82 mg/dL (ref <150)
VLDL: 16 mg/dL (ref 0–40)

## 2014-04-06 LAB — COMPLETE METABOLIC PANEL WITH GFR
ALK PHOS: 68 U/L (ref 39–117)
ALT: 8 U/L (ref 0–35)
AST: 15 U/L (ref 0–37)
Albumin: 3.8 g/dL (ref 3.5–5.2)
BUN: 12 mg/dL (ref 6–23)
CO2: 25 mEq/L (ref 19–32)
Calcium: 9.3 mg/dL (ref 8.4–10.5)
Chloride: 105 mEq/L (ref 96–112)
Creat: 1.1 mg/dL (ref 0.50–1.10)
GFR, EST AFRICAN AMERICAN: 60 mL/min
GFR, EST NON AFRICAN AMERICAN: 52 mL/min — AB
GLUCOSE: 97 mg/dL (ref 70–99)
Potassium: 4.3 mEq/L (ref 3.5–5.3)
Sodium: 141 mEq/L (ref 135–145)
Total Bilirubin: 0.5 mg/dL (ref 0.2–1.2)
Total Protein: 6.3 g/dL (ref 6.0–8.3)

## 2014-04-06 NOTE — Progress Notes (Signed)
Subjective:    Patient ID: Pamela Kidd, female    DOB: 09/02/1945, 69 y.o.   MRN: [id number]  HPI  Patient is here today at our request to recheck her cholesterol. She has a history of coronary artery disease. She is currently on Crestor for hyperlipidemia. Her goal LDL is less than 70. She is overdue to recheck a fasting lipid panel. She also has a history of hyperglycemia with a strong family history of type 2 diabetes mellitus. She is overdue to monitor this. Of note she also reports orthostatic dizziness. Her blood pressure today is very low. Patient states that she is not taking in enough fluids and not eating very well. She has early satiety. She is recently underwent a significant GI workup including an EGD with no specific cause of her early satiety. Her weight is stable. She ranges 90-92 pounds the last calendar year. She's not drinking enough fluids. She is not eating well enough. She also reports some anxiety at night and some subjective shortness of breath when she lies down at night that keeps her from sleeping. She also continues to complain of some mild memory loss. Please see my last office visit in November when I discuss this further. Past Medical History  Diagnosis Date  . Ischemic cardiomyopathy     a. 08/27/2011 Echo: EF 35-40%, mid-dist anteroseptal/apical akinesis, Gr 1 DD, mild-mod TR, PASP 56mmHg  . Hypotension     a. preventing use of ACEI  . Diverticulosis   . Hyperlipidemia     takes Crestor nightly  . Asthmatic bronchitis     "as a child; haven't had any since ~ I was 30 yr old" (06/08/2012)  . Anemia     "I've always been anemic; never had a transfusion" (06/08/2012)  . H/O hiatal hernia   . Oral cancer 2003    a. s/p radiation/chemotherapy  . Emphysema of lung   . GERD (gastroesophageal reflux disease)     takes Protonix daily  . Constipation     takes Miralax daily prn  . CAD (coronary artery disease)     a. 08/26/2011 Acute Ant STEMI/Cath/PCI: LM nl, LAD  100p -> 2.75x23 Xience Xpedition, LCX 20, minor irregs, RCA 20-82mid, EF 30-35%, anterior/apical AK;  b. 05/2012 Cath: LM nl, LAD patent LAD stent, LCX 30ost, RCA dom, nl, EF 65%.  . Complication of anesthesia     slow to wake up  . Vertigo     occasionally  . Bruises easily     d/t taking Plavix and ASA  . History of colon polyps   . Dry mouth   . PONV (postoperative nausea and vomiting)   . STEMI (ST elevation myocardial infarction) 08/2011    anterior/notes 09/07/2011 (06/08/2012)  . Gastritis    Past Surgical History  Procedure Laterality Date  . Bunionectomy Bilateral 1970's  . Shoulder open rotator cuff repair Left 1990's  . Tonsillectomy  ~ 1951  . Vaginal hysterectomy  ~ 1989    partial  . Tubal ligation  1975  . Coronary angioplasty with stent placement  08/2011    "1" (06/08/2012)  . Cardiac catheterization  06/09/2012    patent prox LAD stent, minor nonobstructive LAD disease, 30% ostial LCx, and otherwise widely patent cors; LVEF 65%  . Colonoscopy    . Peg placement      in 2003 and then removed  . Cholecystectomy  08/18/2012  . Cholecystectomy N/A 08/18/2012    Procedure: LAPAROSCOPIC CHOLECYSTECTOMY WITH INTRAOPERATIVE CHOLANGIOGRAM;  Surgeon: Imogene Burn. Georgette Dover, MD;  Location: [place];  Service: General;  Laterality: N/A;  . Left heart catheterization with coronary angiogram N/A 08/26/2011    Procedure: LEFT HEART CATHETERIZATION WITH CORONARY ANGIOGRAM;  Surgeon: Peter M Martinique, MD;  Location: [facility] CATH LAB;  Service: Cardiovascular;  Laterality: N/A;  . Percutaneous coronary stent intervention (pci-s) N/A 08/26/2011    Procedure: PERCUTANEOUS CORONARY STENT INTERVENTION (PCI-S);  Surgeon: Peter M Martinique, MD;  Location: [facility] CATH LAB;  Service: Cardiovascular;  Laterality: N/A;  . Left heart catheterization with coronary angiogram N/A 06/09/2012    Procedure: LEFT HEART CATHETERIZATION WITH CORONARY ANGIOGRAM;  Surgeon: Sherren Mocha, MD;  Location: [facility] CATH LAB;  Service:  Cardiovascular;  Laterality: N/A;   Current Outpatient Prescriptions on File Prior to Visit  Medication Sig Dispense Refill  . aspirin EC 81 MG tablet Take 1 tablet (81 mg total) by mouth daily. 90 tablet 3  . nitroGLYCERIN (NITROSTAT) 0.4 MG SL tablet Place 1 tablet (0.4 mg total) under the tongue every 5 (five) minutes x 3 doses as needed for chest pain. 30 tablet 3  . pantoprazole (PROTONIX) 40 MG tablet Take 1 tablet (40 mg total) by mouth daily before breakfast. 90 tablet 3  . rosuvastatin (CRESTOR) 20 MG tablet Take 1 tablet (20 mg total) by mouth daily. 90 tablet 3   No current facility-administered medications on file prior to visit.   Allergies  Allergen Reactions  . Ampicillin Itching  . Codeine Itching and Nausea And Vomiting  . Nubain [Nalbuphine Hcl] Other (See Comments)    "headache & disoriented"     History   Social History  . Marital Status: Married    Spouse Name: N/A  . Number of Children: 4  . Years of Education: N/A   Occupational History  . Retired    Social History Main Topics  . Smoking status: Never Smoker   . Smokeless tobacco: Never Used  . Alcohol Use: No  . Drug Use: No  . Sexual Activity: No   Other Topics Concern  . Not on file   Social History Narrative     Review of Systems  All other systems reviewed and are negative.      Objective:   Physical Exam  Constitutional: She appears well-developed.  Neck: Neck supple. No JVD present. No thyromegaly present.  Cardiovascular: Normal rate, regular rhythm and normal heart sounds.   No murmur heard. Pulmonary/Chest: Effort normal and breath sounds normal. No respiratory distress. She has no wheezes. She has no rales.  Abdominal: Soft. Bowel sounds are normal. She exhibits no distension. There is no tenderness. There is no rebound and no guarding.  Musculoskeletal: She exhibits no edema.  Vitals reviewed.         Assessment & Plan:  HLD (hyperlipidemia) - Plan: COMPLETE  METABOLIC PANEL WITH GFR, Lipid panel, Hemoglobin A1c  Hyperglycemia - Plan: Hemoglobin A1c  Early satiety  I will check  Her fasting lipid panel. Goal LDL cholesterol is less than 70. I will also check a hemoglobin A1c to monitor for her prediabetes. If her lab work is relatively normal I will start the patient on Remeron 30 mg by mouth daily at bedtime as an appetite stimulant. Patient refuses to drink Ensure or boost. I do believe that her dizziness upon standing is related to low blood pressure and dehydration. I believe she had a better appetite and better by mouth intake her symptoms would improve. If she tolerates the Remeron over the  next month I would consider starting the patient on Aricept for memory loss

## 2014-04-07 LAB — HEMOGLOBIN A1C
Hgb A1c MFr Bld: 5.9 % — ABNORMAL HIGH (ref <5.7)
Mean Plasma Glucose: 123 mg/dL — ABNORMAL HIGH (ref <117)

## 2014-04-11 ENCOUNTER — Telehealth: Payer: Self-pay | Admitting: Cardiology

## 2014-04-11 DIAGNOSIS — R198 Other specified symptoms and signs involving the digestive system and abdomen: Secondary | ICD-10-CM

## 2014-04-11 DIAGNOSIS — R0602 Shortness of breath: Secondary | ICD-10-CM

## 2014-04-11 NOTE — Telephone Encounter (Signed)
[id number]

## 2014-04-11 NOTE — Telephone Encounter (Signed)
Pt is returning Nathan's call °

## 2014-04-11 NOTE — Telephone Encounter (Signed)
Returning your call. °

## 2014-04-11 NOTE — Telephone Encounter (Signed)
New message    Pt c/o Shortness Of Breath: STAT if SOB developed within the last 24 hours or pt is noticeably SOB on the phone  1. Are you currently SOB (can you hear that pt is SOB on the phone)? No . Patient is stating her breathing is getting worse   2. How long have you been experiencing SOB? Last night was worse. Going on for a while now.   3. Are you SOB when sitting or when up moving around? both  4. Are you currently experiencing any other symptoms? No

## 2014-04-11 NOTE — Telephone Encounter (Signed)
RE: dyspnea  Had extensive conversation w/ patient. Pt primarily c/o dyspnea/sensation of dyspnea at night when lying down, assoc w/ fullness in chest that tended to follow meals. This has been ongoing for unspecified duration (weeks/months), she had a more pronounced episode the other night, but states has been occurrent since prior to last cardiology visit. I acknowledged recent normal stress test.     Had difficulty in helping pt understand the need to f/u w/ GI specialist regarding her reflux symptoms. I did address the fact that the feeling of fullness, sensation of food sticking & gastric discomfort (esp. Post meal) would be best handled by GI.  She seemed confused & under the impression that Dr. Martinique could not address her issues; either GI or PCP office had directed her to call us.  I explained that I was concerned that her PPI may not be effective & this could contribute to symptoms, but clarified I would route concerns of dyspnea to Dr. Martinique.   During conversation pt did not make mention of ongoing event monitoring. R/t this, she did not state any recent syncope.

## 2014-04-11 NOTE — Telephone Encounter (Signed)
Please call,pt called again.

## 2014-04-11 NOTE — Telephone Encounter (Signed)
Follow Up   Pt called back     New message  Pt c/o Shortness Of Breath: STAT if SOB developed within the last 24 hours or pt is noticeably SOB on the phone  1. Are you currently SOB (can you hear that pt is SOB on the phone)? No . Patient is stating her breathing is getting worse  2. How long have you been experiencing SOB? Last night was worse. Going on for a while now.  3. Are you SOB when sitting or when up moving around? both  4. Are you currently experiencing any other symptoms? No

## 2014-04-11 NOTE — Telephone Encounter (Signed)
Called, no answer.

## 2014-04-11 NOTE — Telephone Encounter (Signed)
Pt was awake on and off last night.  Can't take deep breath. Has been going on x several days. Feeling of fullness in chest (like had too much to eat)  Recent med change, this was unspecified - phone disconnected before I could get more information or give further advice to pt.

## 2014-04-12 ENCOUNTER — Ambulatory Visit
Admission: RE | Admit: 2014-04-12 | Discharge: 2014-04-12 | Disposition: A | Discharge status: Home / Self Care | Payer: Commercial Managed Care - HMO | Source: Ambulatory Visit | Attending: Cardiology | Admitting: Cardiology

## 2014-04-12 DIAGNOSIS — R0602 Shortness of breath: Secondary | ICD-10-CM

## 2014-04-12 NOTE — Addendum Note (Signed)
Addended by: Golden Hurter D on: 04/12/2014 09:43 AM   Modules accepted: Orders

## 2014-04-12 NOTE — Telephone Encounter (Signed)
We will await results of event monitor to evaluate her dizziness. Her symptoms of early satiety need to be reevaluated by GI. She was recently seen by Dr Dennard Schaumann who addressed her symptoms including SOB. I would check a CXR for completeness sake since this hasn't been done in 2 years.  Peter Martinique MD, [facility]

## 2014-04-12 NOTE — Telephone Encounter (Signed)
Returned call to patient Dr.Jordan advised will wait for monitor results.Stated she returns monitor 04/20/14.Advised needs GI evaluation for reflux.Schedulers will call back with GI appointment.Advised needs a CXR for sob.

## 2014-04-14 ENCOUNTER — Observation Stay (HOSPITAL_COMMUNITY)
Admission: EM | Admit: 2014-04-14 | Discharge: 2014-04-15 | Disposition: A | Discharge status: Home / Self Care | Payer: Commercial Managed Care - HMO | Attending: Internal Medicine | Admitting: Internal Medicine

## 2014-04-14 ENCOUNTER — Emergency Department (HOSPITAL_COMMUNITY): Payer: Commercial Managed Care - HMO

## 2014-04-14 ENCOUNTER — Encounter (HOSPITAL_COMMUNITY): Payer: Self-pay | Admitting: Emergency Medicine

## 2014-04-14 DIAGNOSIS — E785 Hyperlipidemia, unspecified: Secondary | ICD-10-CM | POA: Diagnosis not present

## 2014-04-14 DIAGNOSIS — Z85819 Personal history of malignant neoplasm of unspecified site of lip, oral cavity, and pharynx: Secondary | ICD-10-CM | POA: Insufficient documentation

## 2014-04-14 DIAGNOSIS — I25118 Atherosclerotic heart disease of native coronary artery with other forms of angina pectoris: Secondary | ICD-10-CM | POA: Diagnosis not present

## 2014-04-14 DIAGNOSIS — Z7982 Long term (current) use of aspirin: Secondary | ICD-10-CM | POA: Diagnosis not present

## 2014-04-14 DIAGNOSIS — Z90710 Acquired absence of both cervix and uterus: Secondary | ICD-10-CM | POA: Diagnosis not present

## 2014-04-14 DIAGNOSIS — R0600 Dyspnea, unspecified: Secondary | ICD-10-CM

## 2014-04-14 DIAGNOSIS — E7800 Pure hypercholesterolemia, unspecified: Secondary | ICD-10-CM | POA: Diagnosis present

## 2014-04-14 DIAGNOSIS — Z9221 Personal history of antineoplastic chemotherapy: Secondary | ICD-10-CM | POA: Diagnosis not present

## 2014-04-14 DIAGNOSIS — F0390 Unspecified dementia without behavioral disturbance: Secondary | ICD-10-CM | POA: Diagnosis not present

## 2014-04-14 DIAGNOSIS — I472 Ventricular tachycardia, unspecified: Secondary | ICD-10-CM

## 2014-04-14 DIAGNOSIS — K219 Gastro-esophageal reflux disease without esophagitis: Secondary | ICD-10-CM | POA: Insufficient documentation

## 2014-04-14 DIAGNOSIS — I252 Old myocardial infarction: Secondary | ICD-10-CM | POA: Insufficient documentation

## 2014-04-14 DIAGNOSIS — J45909 Unspecified asthma, uncomplicated: Secondary | ICD-10-CM | POA: Insufficient documentation

## 2014-04-14 DIAGNOSIS — I10 Essential (primary) hypertension: Secondary | ICD-10-CM | POA: Diagnosis not present

## 2014-04-14 DIAGNOSIS — K2970 Gastritis, unspecified, without bleeding: Secondary | ICD-10-CM

## 2014-04-14 DIAGNOSIS — Z881 Allergy status to other antibiotic agents status: Secondary | ICD-10-CM | POA: Insufficient documentation

## 2014-04-14 DIAGNOSIS — R0789 Other chest pain: Principal | ICD-10-CM | POA: Diagnosis present

## 2014-04-14 DIAGNOSIS — I2510 Atherosclerotic heart disease of native coronary artery without angina pectoris: Secondary | ICD-10-CM | POA: Insufficient documentation

## 2014-04-14 DIAGNOSIS — Z9851 Tubal ligation status: Secondary | ICD-10-CM | POA: Insufficient documentation

## 2014-04-14 DIAGNOSIS — R0602 Shortness of breath: Secondary | ICD-10-CM

## 2014-04-14 DIAGNOSIS — Z86010 Personal history of colonic polyps: Secondary | ICD-10-CM | POA: Insufficient documentation

## 2014-04-14 DIAGNOSIS — J439 Emphysema, unspecified: Secondary | ICD-10-CM | POA: Insufficient documentation

## 2014-04-14 DIAGNOSIS — Z955 Presence of coronary angioplasty implant and graft: Secondary | ICD-10-CM | POA: Diagnosis not present

## 2014-04-14 DIAGNOSIS — Z886 Allergy status to analgesic agent status: Secondary | ICD-10-CM | POA: Diagnosis not present

## 2014-04-14 DIAGNOSIS — R079 Chest pain, unspecified: Secondary | ICD-10-CM | POA: Diagnosis present

## 2014-04-14 DIAGNOSIS — Z923 Personal history of irradiation: Secondary | ICD-10-CM | POA: Diagnosis not present

## 2014-04-14 DIAGNOSIS — I255 Ischemic cardiomyopathy: Secondary | ICD-10-CM | POA: Diagnosis not present

## 2014-04-14 DIAGNOSIS — Z9049 Acquired absence of other specified parts of digestive tract: Secondary | ICD-10-CM | POA: Diagnosis not present

## 2014-04-14 LAB — BASIC METABOLIC PANEL
Anion gap: 6 (ref 5–15)
BUN: 9 mg/dL (ref 6–23)
CO2: 28 mmol/L (ref 19–32)
CREATININE: 1.15 mg/dL — AB (ref 0.50–1.10)
Calcium: 9.3 mg/dL (ref 8.4–10.5)
Chloride: 107 mmol/L (ref 96–112)
GFR calc non Af Amer: 48 mL/min — ABNORMAL LOW (ref >90)
GFR, EST AFRICAN AMERICAN: 55 mL/min — AB (ref >90)
GLUCOSE: 123 mg/dL — AB (ref 70–99)
POTASSIUM: 3.5 mmol/L (ref 3.5–5.1)
Sodium: 141 mmol/L (ref 135–145)

## 2014-04-14 LAB — HEPATIC FUNCTION PANEL
ALK PHOS: 74 U/L (ref 39–117)
ALT: 11 U/L (ref 0–35)
AST: 19 U/L (ref 0–37)
Albumin: 3.6 g/dL (ref 3.5–5.2)
BILIRUBIN INDIRECT: 0.3 mg/dL (ref 0.3–0.9)
BILIRUBIN TOTAL: 0.5 mg/dL (ref 0.3–1.2)
Bilirubin, Direct: 0.2 mg/dL (ref 0.0–0.5)
TOTAL PROTEIN: 6 g/dL (ref 6.0–8.3)

## 2014-04-14 LAB — CBC WITH DIFFERENTIAL/PLATELET
BASOS ABS: 0.1 10*3/uL (ref 0.0–0.1)
Basophils Relative: 1 % (ref 0–1)
Eosinophils Absolute: 0.1 10*3/uL (ref 0.0–0.7)
Eosinophils Relative: 1 % (ref 0–5)
HEMATOCRIT: 42.1 % (ref 36.0–46.0)
HEMOGLOBIN: 13.7 g/dL (ref 12.0–15.0)
LYMPHS ABS: 1.1 10*3/uL (ref 0.7–4.0)
Lymphocytes Relative: 16 % (ref 12–46)
MCH: 29.5 pg (ref 26.0–34.0)
MCHC: 32.5 g/dL (ref 30.0–36.0)
MCV: 90.7 fL (ref 78.0–100.0)
MONOS PCT: 6 % (ref 3–12)
Monocytes Absolute: 0.5 10*3/uL (ref 0.1–1.0)
Neutro Abs: 5.5 10*3/uL (ref 1.7–7.7)
Neutrophils Relative %: 76 % (ref 43–77)
Platelets: 228 10*3/uL (ref 150–400)
RBC: 4.64 MIL/uL (ref 3.87–5.11)
RDW: 13.4 % (ref 11.5–15.5)
WBC: 7.3 10*3/uL (ref 4.0–10.5)

## 2014-04-14 LAB — LIPASE, BLOOD: LIPASE: 34 U/L (ref 11–59)

## 2014-04-14 LAB — TROPONIN I
Troponin I: <0.03 ng/mL (ref <0.031)
Troponin I: <0.03 ng/mL (ref <0.031)
Troponin I: <0.03 ng/mL (ref <0.031)

## 2014-04-14 LAB — I-STAT TROPONIN, ED: Troponin i, poc: 0 ng/mL (ref 0.00–0.08)

## 2014-04-14 LAB — BRAIN NATRIURETIC PEPTIDE: B Natriuretic Peptide: 29.3 pg/mL (ref 0.0–100.0)

## 2014-04-14 MED ORDER — NITROGLYCERIN 0.4 MG SL SUBL: 0.4000 mg | SUBLINGUAL_TABLET | SUBLINGUAL | Status: DC | PRN

## 2014-04-14 MED ORDER — ALUM & MAG HYDROXIDE-SIMETH 200-200-20 MG/5ML PO SUSP: 30.0000 mL | Freq: Four times a day (QID) | ORAL | Status: DC | PRN

## 2014-04-14 MED ORDER — ALBUTEROL SULFATE (2.5 MG/3ML) 0.083% IN NEBU: 2.5000 mg | INHALATION_SOLUTION | RESPIRATORY_TRACT | Status: DC | PRN

## 2014-04-14 MED ORDER — ONDANSETRON HCL 4 MG PO TABS: 4.0000 mg | ORAL_TABLET | Freq: Four times a day (QID) | ORAL | Status: DC | PRN

## 2014-04-14 MED ORDER — ENOXAPARIN SODIUM 30 MG/0.3ML ~~LOC~~ SOLN
20.0000 mg | SUBCUTANEOUS | Status: DC
  Administered 2014-04-14: 20 mg via SUBCUTANEOUS
  Filled 2014-04-14 (×2): qty 0.2

## 2014-04-14 MED ORDER — SODIUM CHLORIDE 0.9 % IJ SOLN
3.0000 mL | Freq: Two times a day (BID) | INTRAMUSCULAR | Status: DC
  Administered 2014-04-14: 3 mL via INTRAVENOUS

## 2014-04-14 MED ORDER — ASPIRIN EC 81 MG PO TBEC
81.0000 mg | DELAYED_RELEASE_TABLET | Freq: Every day | ORAL | Status: DC
Start: 2014-04-14 — End: 2014-04-15
  Administered 2014-04-14 – 2014-04-15 (×2): 81 mg via ORAL
  Filled 2014-04-14 (×2): qty 1

## 2014-04-14 MED ORDER — ROSUVASTATIN CALCIUM 20 MG PO TABS
20.0000 mg | ORAL_TABLET | Freq: Every day | ORAL | Status: DC
  Administered 2014-04-14 – 2014-04-15 (×2): 20 mg via ORAL
  Filled 2014-04-14 (×2): qty 1

## 2014-04-14 MED ORDER — PANTOPRAZOLE SODIUM 40 MG PO TBEC
40.0000 mg | DELAYED_RELEASE_TABLET | Freq: Every day | ORAL | Status: DC
  Administered 2014-04-15: 40 mg via ORAL
  Filled 2014-04-14: qty 1

## 2014-04-14 MED ORDER — ONDANSETRON HCL 4 MG/2ML IJ SOLN
4.0000 mg | Freq: Four times a day (QID) | INTRAMUSCULAR | Status: DC | PRN
  Administered 2014-04-15: 4 mg via INTRAVENOUS
  Filled 2014-04-14: qty 2

## 2014-04-14 NOTE — H&P (Addendum)
History and Physical  Pamela Kidd JJO:[id number] DOB: 27-Dec-1945 DOA: 04/14/2014  Referring physician: Dr. Delora Fuel, EDP PCP: [name], MD  Outpatient Specialists:  1. Cardiology: Dr. Peter Martinique  Chief Complaint: Chest pain and dyspnea  HPI: Pamela Kidd is a 69 y.o. female with history of dementia, MI, CAD, last 5/14-LM normal, patent LAD stent and EF 65%, ischemic cardiomyopathy, hyperlipidemia, oral cancer status post radiation and chemotherapy, emphysema, GERD, presented to the ED with complaints of chest pain and dyspnea. Patient is a poor historian secondary to dementia related memory deficits and confusion. Patient's spouse is unable to add significantly to the history. As per both of them, patient has chronic mid back pain between the shoulder blades. 2 days ago while watching TV, she complained of midsternal chest pain which she describes as mild and states that it was nothing like the time that she had her heart attack, cannot tell if it was radiating or associated with any other symptoms except mid back pain and apparently resolved spontaneously after 30 minutes. She does not have frequent chest pains. She does complain of intermittently having "difficulty taking deep breaths" but denies cough, fever or chills. No history of recent long-distance travel. As per previous chart review, she has an event monitor to evaluate her dizziness which she did not complain of during today's visit. Patient currently does not complain of chest pain or dyspnea. EDP discussed her care with cardiologist on call who recommended hospitalist admission for ACS rule out and further evaluation.    Review of Systems: All systems reviewed and apart from history of presenting illness, are negative.  Past Medical History  Diagnosis Date  . Ischemic cardiomyopathy     a. 08/27/2011 Echo: EF 35-40%, mid-dist anteroseptal/apical akinesis, Gr 1 DD, mild-mod TR, PASP 78mmHg  . Hypotension     a.  preventing use of ACEI  . Diverticulosis   . Hyperlipidemia     takes Crestor nightly  . Asthmatic bronchitis     "as a child; haven't had any since ~ I was 36 yr old" (06/08/2012)  . Anemia     "I've always been anemic; never had a transfusion" (06/08/2012)  . H/O hiatal hernia   . Oral cancer 2003    a. s/p radiation/chemotherapy  . Emphysema of lung   . GERD (gastroesophageal reflux disease)     takes Protonix daily  . Constipation     takes Miralax daily prn  . CAD (coronary artery disease)     a. 08/26/2011 Acute Ant STEMI/Cath/PCI: LM nl, LAD 100p -> 2.75x23 Xience Xpedition, LCX 20, minor irregs, RCA 20-28mid, EF 30-35%, anterior/apical AK;  b. 05/2012 Cath: LM nl, LAD patent LAD stent, LCX 30ost, RCA dom, nl, EF 65%.  . Complication of anesthesia     slow to wake up  . Vertigo     occasionally  . Bruises easily     d/t taking Plavix and ASA  . History of colon polyps   . Dry mouth   . PONV (postoperative nausea and vomiting)   . STEMI (ST elevation myocardial infarction) 08/2011    anterior/notes 09/07/2011 (06/08/2012)  . Gastritis    Past Surgical History  Procedure Laterality Date  . Bunionectomy Bilateral 1970's  . Shoulder open rotator cuff repair Left 1990's  . Tonsillectomy  ~ 1951  . Vaginal hysterectomy  ~ 1989    partial  . Tubal ligation  1975  . Coronary angioplasty with stent placement  08/2011    "  1" (06/08/2012)  . Cardiac catheterization  06/09/2012    patent prox LAD stent, minor nonobstructive LAD disease, 30% ostial LCx, and otherwise widely patent cors; LVEF 65%  . Colonoscopy    . Peg placement      in 2003 and then removed  . Cholecystectomy  08/18/2012  . Cholecystectomy N/A 08/18/2012    Procedure: LAPAROSCOPIC CHOLECYSTECTOMY WITH INTRAOPERATIVE CHOLANGIOGRAM;  Surgeon: Imogene Burn. Georgette Dover, MD;  Location: [place];  Service: General;  Laterality: N/A;  . Left heart catheterization with coronary angiogram N/A 08/26/2011    Procedure: LEFT HEART  CATHETERIZATION WITH CORONARY ANGIOGRAM;  Surgeon: Peter M Martinique, MD;  Location: [facility] CATH LAB;  Service: Cardiovascular;  Laterality: N/A;  . Percutaneous coronary stent intervention (pci-s) N/A 08/26/2011    Procedure: PERCUTANEOUS CORONARY STENT INTERVENTION (PCI-S);  Surgeon: Peter M Martinique, MD;  Location: [facility] CATH LAB;  Service: Cardiovascular;  Laterality: N/A;  . Left heart catheterization with coronary angiogram N/A 06/09/2012    Procedure: LEFT HEART CATHETERIZATION WITH CORONARY ANGIOGRAM;  Surgeon: Sherren Mocha, MD;  Location: [facility] CATH LAB;  Service: Cardiovascular;  Laterality: N/A;   Social History:  reports that she has never smoked. She has never used smokeless tobacco. She reports that she does not drink alcohol or use illicit drugs.  married. Lives with husband. Independent of activities of daily living.   Allergies  Allergen Reactions  . Ampicillin Itching  . Codeine Itching and Nausea And Vomiting  . Nubain [Nalbuphine Hcl] Other (See Comments)    "headache & disoriented"      Family History  Problem Relation Age of Onset  . Dementia Mother   . Heart disease Father   . Cancer Father     prostate  . Heart disease Paternal Grandfather   . Colon cancer Neg Hx     Prior to Admission medications   Medication Sig Start Date End Date Taking? Authorizing Provider  aspirin EC 81 MG tablet Take 1 tablet (81 mg total) by mouth daily. 09/08/12  Yes Peter M Martinique, MD  ibuprofen (ADVIL,MOTRIN) 200 MG tablet Take 400 mg by mouth every 6 (six) hours as needed for headache.   Yes Historical Provider, MD  nitroGLYCERIN (NITROSTAT) 0.4 MG SL tablet Place 1 tablet (0.4 mg total) under the tongue every 5 (five) minutes x 3 doses as needed for chest pain. 09/08/12 04/14/14 Yes Peter M Martinique, MD  pantoprazole (PROTONIX) 40 MG tablet Take 1 tablet (40 mg total) by mouth daily before breakfast. 02/27/14  Yes Milus Banister, MD  rosuvastatin (CRESTOR) 20 MG tablet Take 1 tablet (20 mg total) by  mouth daily. 08/14/13  Yes Peter M Martinique, MD   Physical Exam: Filed Vitals:   04/14/14 0645 04/14/14 0700 04/14/14 0715 04/14/14 0730  BP: 128/80 121/77 113/72 121/75  Pulse: 63 65 65 63  Temp:      TempSrc:      Resp: 14 17 17 14   SpO2: 98% 100% 99% 98%   temperature: 98.3F.    General exam: Moderately built and nourished pleasant elderly female patient, lying comfortably supine on the gurney in no obvious distress.  Head, eyes and ENT: Nontraumatic and normocephalic. Pupils equally reacting to light and accommodation. Oral mucosa moist.  Neck: Supple. No JVD, carotid bruit or thyromegaly.  Lymphatics: No lymphadenopathy.  Respiratory system: Clear to auscultation. No increased work of breathing.  Cardiovascular system: S1 and S2 heard, RRR. No JVD, murmurs, gallops, clicks or pedal edema. No reproducible chest wall tenderness.  Gastrointestinal system: Abdomen is nondistended, soft and nontender. Normal bowel sounds heard. No organomegaly or masses appreciated.  Central nervous system: Alert and oriented to person and place only . No focal neurological deficits.  Extremities: Symmetric 5 x 5 power. Peripheral pulses symmetrically felt.   Skin: No rashes or acute findings.  Musculoskeletal system: Negative exam.  Psychiatry: Pleasant and cooperative.   Labs on Admission:  Basic Metabolic Panel:  Recent Labs Lab 04/14/14 0531  NA 141  K 3.5  CL 107  CO2 28  GLUCOSE 123*  BUN 9  CREATININE 1.15*  CALCIUM 9.3   Liver Function Tests: No results for input(s): AST, ALT, ALKPHOS, BILITOT, PROT, ALBUMIN in the last 168 hours. No results for input(s): LIPASE, AMYLASE in the last 168 hours. No results for input(s): AMMONIA in the last 168 hours. CBC:  Recent Labs Lab 04/14/14 0531  WBC 7.3  NEUTROABS 5.5  HGB 13.7  HCT 42.1  MCV 90.7  PLT 228   Cardiac Enzymes: No results for input(s): CKTOTAL, CKMB, CKMBINDEX, TROPONINI in the last 168 hours.  BNP  (last 3 results) No results for input(s): PROBNP in the last 8760 hours. CBG: No results for input(s): GLUCAP in the last 168 hours.  Radiological Exams on Admission: Dg Chest 2 View  04/12/2014   CLINICAL DATA:  Shortness of breath for 1 month. Irregular heart rhythm.  EXAM: CHEST  2 VIEW  COMPARISON:  05/20/2012  FINDINGS: The heart size and mediastinal contours are within normal limits. Both lungs are clear. The visualized skeletal structures are unremarkable.  IMPRESSION: No active cardiopulmonary disease.   Electronically Signed   By: Kerby Moors M.D.   On: 04/12/2014 18:41   Dg Chest Port 1 View  04/14/2014   CLINICAL DATA:  Chest pain. Awoke from sleep with nausea and shortness of breath.  EXAM: PORTABLE CHEST - 1 VIEW  COMPARISON:  04/12/2014  FINDINGS: Heart size is normal, mild tortuosity of the thoracic aorta. The lungs are clear. Pulmonary vasculature is normal. No consolidation, pleural effusion, or pneumothorax. No acute osseous abnormalities are seen.  IMPRESSION: No acute pulmonary process.   Electronically Signed   By: Jeb Levering M.D.   On: 04/14/2014 06:49    EKG: Independently reviewed. sinus rhythm without acute changes. QTC 403 ms.  Assessment/Plan  69 y.o. female with history of dementia, MI, CAD, last 5/14-LM normal, patent LAD stent and EF 65%, ischemic cardiomyopathy, hyperlipidemia, oral cancer status post radiation and chemotherapy, emphysema, GERD, presented to the ED with complaints of chest pain and dyspnea. Patient is a poor historian secondary to dementia related memory deficits and confusion. Patient's spouse is unable to add significantly to the history. Admitted to R/O ACS and further evaluation of CP.  Principal Problem:   Atypical chest pain - Unable to obtain a clear history secondary to history of dementia. - Does have history of significant cardiac disease including MI, CAD status post PCI & ischemic cardiomyopathy. - Admit to telemetry -  Initial POC troponin 1 and EKG negative for acute changes.  - Continue aspirin and when necessary sublingual nitroglycerin.  - We will check LFTs and lipase to rule out GI etiology.  - Cardiology has been consulted for further ischemic workup.   Active Problems:   Possible dyspnea  - History not very clear.  - No significant findings on clinical exam. Does not appear volume overloaded.  - Chest x-ray negative.  - Low index of suspicion for PE.  - ? Anxiety  related to dementia.  - Monitor.      Ischemic cardiomyopathy -  last EF by cath was normal.     CAD (coronary artery disease), native coronary artery - Management as above.   - cardiology consulted.    Hyperlipidemia -  continue statins.    Dizziness - Unclear etiology. - Check orthostatic blood pressures. - Monitor on telemetry for arrhythmias. - Has an event monitor. Management per cardiology.  GERD  - Continue PPI.        Code Status: Full  Family Communication: discussed with spouse at bedside. Discussed with daughter- in-law Troy Sine via phone. Disposition Plan: home possibly 3/27    Time spent: 40 minutes   Marek Nghiem, MD, FACP, FHM. [facility] Pager [phone]  If 7PM-7AM, please contact night-coverage [email or web address] Password [facility] 04/14/2014, 9:03 AM

## 2014-04-14 NOTE — ED Notes (Signed)
Per pt's husband, pt is wearing a heart monitor due to an irregular heart beat. He also states that the pt has a little dementia.

## 2014-04-14 NOTE — Consult Note (Signed)
CARDIOLOGY CONSULT NOTE   Patient ID: Pamela Kidd MRN: [id number] DOB/AGE: 03/18/1945 69 y.o.  Admit date: 04/14/2014  Primary Physician   [name], MD Primary Cardiologist Dr. Arlyn Leak Reason for Consultation  Chest pain  HPI: Pamela Kidd is a 69 y.o. female with a history of dementia, CAD s/p DES to LAD (2013), ischemic CM (EF 40%--> now normalized EF ~60%), HLD, HTN, GERD, oral cancer s/p rad/chemo, emphysema who presented to [facility] ED today with chest pain and dyspnea.  She had a negative Myoview in October of 2013. Repeat cath 5/14 showed nonobstructive disease. She did undergo EGD in February with findings of pan gastritis. Biopsies were negative. She is on PPI. She was seen in the office by Dr. Martinique in 02/2014 when she complained of intermittent episodes of almost blacking out typically occuring with exertion-walking through her house. It started with a sense of fullness in her chest and SOB. She was set up for a nuclear stres test which returned as a low risk stress nuclear study with moderate size and intensity, predominantly fixed septal defect, no significant reversible ischemia. LV Ejection Fraction: 78% and septal dysynchrony. A cardiac event monitor was placed.  Patient is a poor historian secondary to dementia related memory deficits and confusion. Patient's spouse is unable to add significantly to the history. As per both of them, patient has chronic mid back pain between the shoulder blades. 2 days ago while watching TV, she complained of midsternal chest pain which she describes as mild and states that it was nothing like the time that she had her heart attack, cannot tell if it was radiating or associated with any other symptoms except mid back pain and apparently resolved spontaneously after 30 minutes. She does not have frequent chest pains. She does complain of intermittently having "difficulty taking deep breaths" but denies cough, fever or chills. Feels like  she has something "wiggling in her chest." No history of recent long-distance travel. As per previous chart review, she has an event monitor to evaluate her dizziness which she did not complain of during today's visit. Patient currently does not complain of chest pain or dyspnea.  Past Medical History  Diagnosis Date  . Ischemic cardiomyopathy     a. 08/27/2011 Echo: EF 35-40%, mid-dist anteroseptal/apical akinesis, Gr 1 DD, mild-mod TR, PASP 46mmHg  . Hypotension     a. preventing use of ACEI  . Diverticulosis   . Hyperlipidemia     takes Crestor nightly  . Asthmatic bronchitis     "as a child; haven't had any since ~ I was 40 yr old" (06/08/2012)  . Anemia     "I've always been anemic; never had a transfusion" (06/08/2012)  . H/O hiatal hernia   . Oral cancer 2003    a. s/p radiation/chemotherapy  . Emphysema of lung   . GERD (gastroesophageal reflux disease)     takes Protonix daily  . Constipation     takes Miralax daily prn  . CAD (coronary artery disease)     a. 08/26/2011 Acute Ant STEMI/Cath/PCI: LM nl, LAD 100p -> 2.75x23 Xience Xpedition, LCX 20, minor irregs, RCA 20-5mid, EF 30-35%, anterior/apical AK;  b. 05/2012 Cath: LM nl, LAD patent LAD stent, LCX 30ost, RCA dom, nl, EF 65%.  . Complication of anesthesia     slow to wake up  . Vertigo     occasionally  . Bruises easily     d/t taking Plavix and ASA  .  History of colon polyps   . Dry mouth   . PONV (postoperative nausea and vomiting)   . STEMI (ST elevation myocardial infarction) 08/2011    anterior/notes 09/07/2011 (06/08/2012)  . Gastritis      Past Surgical History  Procedure Laterality Date  . Bunionectomy Bilateral 1970's  . Shoulder open rotator cuff repair Left 1990's  . Tonsillectomy  ~ 1951  . Vaginal hysterectomy  ~ 1989    partial  . Tubal ligation  1975  . Coronary angioplasty with stent placement  08/2011    "1" (06/08/2012)  . Cardiac catheterization  06/09/2012    patent prox LAD stent, minor  nonobstructive LAD disease, 30% ostial LCx, and otherwise widely patent cors; LVEF 65%  . Colonoscopy    . Peg placement      in 2003 and then removed  . Cholecystectomy  08/18/2012  . Cholecystectomy N/A 08/18/2012    Procedure: LAPAROSCOPIC CHOLECYSTECTOMY WITH INTRAOPERATIVE CHOLANGIOGRAM;  Surgeon: Imogene Burn. Georgette Dover, MD;  Location: [place];  Service: General;  Laterality: N/A;  . Left heart catheterization with coronary angiogram N/A 08/26/2011    Procedure: LEFT HEART CATHETERIZATION WITH CORONARY ANGIOGRAM;  Surgeon: Peter M Martinique, MD;  Location: [facility] CATH LAB;  Service: Cardiovascular;  Laterality: N/A;  . Percutaneous coronary stent intervention (pci-s) N/A 08/26/2011    Procedure: PERCUTANEOUS CORONARY STENT INTERVENTION (PCI-S);  Surgeon: Peter M Martinique, MD;  Location: [facility] CATH LAB;  Service: Cardiovascular;  Laterality: N/A;  . Left heart catheterization with coronary angiogram N/A 06/09/2012    Procedure: LEFT HEART CATHETERIZATION WITH CORONARY ANGIOGRAM;  Surgeon: Sherren Mocha, MD;  Location: [facility] CATH LAB;  Service: Cardiovascular;  Laterality: N/A;    Allergies  Allergen Reactions  . Ampicillin Itching  . Codeine Itching and Nausea And Vomiting  . Nubain [Nalbuphine Hcl] Other (See Comments)    "headache & disoriented"      I have reviewed the patient's current medications . aspirin EC  81 mg Oral Daily  . enoxaparin (LOVENOX) injection  20 mg Subcutaneous Q24H  . [START ON 04/15/2014] pantoprazole  40 mg Oral QAC breakfast  . rosuvastatin  20 mg Oral Daily  . sodium chloride  3 mL Intravenous Q12H     albuterol, alum & mag hydroxide-simeth, nitroGLYCERIN, ondansetron **OR** ondansetron (ZOFRAN) IV  Prior to Admission medications   Medication Sig Start Date End Date Taking? Authorizing Provider  aspirin EC 81 MG tablet Take 1 tablet (81 mg total) by mouth daily. 09/08/12  Yes Peter M Martinique, MD  ibuprofen (ADVIL,MOTRIN) 200 MG tablet Take 400 mg by mouth every 6 (six) hours as  needed for headache.   Yes Historical Provider, MD  nitroGLYCERIN (NITROSTAT) 0.4 MG SL tablet Place 1 tablet (0.4 mg total) under the tongue every 5 (five) minutes x 3 doses as needed for chest pain. 09/08/12 04/14/14 Yes Peter M Martinique, MD  pantoprazole (PROTONIX) 40 MG tablet Take 1 tablet (40 mg total) by mouth daily before breakfast. 02/27/14  Yes Milus Banister, MD  rosuvastatin (CRESTOR) 20 MG tablet Take 1 tablet (20 mg total) by mouth daily. 08/14/13  Yes Peter M Martinique, MD     History   Social History  . Marital Status: Married    Spouse Name: N/A  . Number of Children: 4  . Years of Education: N/A   Occupational History  . Retired    Social History Main Topics  . Smoking status: Never Smoker   . Smokeless tobacco: Never Used  .  Alcohol Use: No  . Drug Use: No  . Sexual Activity: No   Other Topics Concern  . Not on file   Social History Narrative    Family Status  Relation Status Death Age  . Mother Deceased   . Father Deceased    Family History  Problem Relation Age of Onset  . Dementia Mother   . Heart disease Father   . Cancer Father     prostate  . Heart disease Paternal Grandfather   . Colon cancer Neg Hx      ROS:  Full 14 point review of systems complete and found to be negative unless listed above.  Physical Exam: Blood pressure 116/68, pulse 66, temperature 98.4 F (36.9 C), temperature source Oral, resp. rate 22, height 4\' 10"  (1.473 m), weight 89 lb 15.2 oz (40.8 kg), SpO2 98 %.  General: Well developed, well nourished, female in no acute distress Head: Eyes PERRLA, No xanthomas.   Normocephalic and atraumatic, oropharynx without edema or exudate. :  Lungs: CTAB Heart: HRRR S1 S2, no rub/gallop, Heart irregular rate and rhythm with S1, S2  murmur. pulses are 2+ extrem.   Neck: No carotid bruits. No lymphadenopathy.  No JVD. Abdomen: Bowel sounds present, abdomen soft and non-tender without masses or hernias noted. Msk:  No spine or cva  tenderness. No weakness, no joint deformities or effusions. Extremities: No clubbing or cyanosis.  No edema.  Neuro: Alert and oriented X 3. No focal deficits noted. Psych:  Good affect, responds appropriately Skin: No rashes or lesions noted.  Labs:   Lab Results  Component Value Date   WBC 7.3 04/14/2014   HGB 13.7 04/14/2014   HCT 42.1 04/14/2014   MCV 90.7 04/14/2014   PLT 228 04/14/2014   No results for input(s): INR in the last 72 hours.  Recent Labs Lab 04/14/14 0531 04/14/14 0952  NA 141  --   K 3.5  --   CL 107  --   CO2 28  --   BUN 9  --   CREATININE 1.15*  --   CALCIUM 9.3  --   PROT  --  6.0  BILITOT  --  0.5  ALKPHOS  --  74  ALT  --  11  AST  --  19  GLUCOSE 123*  --   ALBUMIN  --  3.6   MAGNESIUM  Date Value Ref Range Status  06/08/2012 2.4 1.5 - 2.5 mg/dL Final   No results for input(s): CKTOTAL, CKMB, TROPONINI in the last 72 hours.  Recent Labs  04/14/14 0539  TROPIPOC 0.00   PRO B NATRIURETIC PEPTIDE (BNP)  Date/Time Value Ref Range Status  08/26/2011 06:05 AM 260.5* 0 - 125 pg/mL Final   Lab Results  Component Value Date   CHOL 138 04/06/2014   HDL 64 04/06/2014   LDLCALC 58 04/06/2014   TRIG 82 04/06/2014   Lab Results  Component Value Date   DDIMER 0.39 12/29/2011   LIPASE  Date/Time Value Ref Range Status  04/14/2014 09:52 AM 34 11 - 59 U/L Final     Echo: Study Date: 12/01/2011 LV EF: 60% -   65% Study Conclusions - Left ventricle: The cavity size was normal. Wall thickness   was normal. Systolic function was normal. The estimated   ejection fraction was in the range of 60% to 65%. - Tricuspid valve: Moderate regurgitation.   ECG:  HR 65: NSR   Radiology:  Dg Chest 2 View  04/12/2014  CLINICAL DATA:  Shortness of breath for 1 month. Irregular heart rhythm.  EXAM: CHEST  2 VIEW  COMPARISON:  05/20/2012  FINDINGS: The heart size and mediastinal contours are within normal limits. Both lungs are clear. The  visualized skeletal structures are unremarkable.  IMPRESSION: No active cardiopulmonary disease.   Electronically Signed   By: Kerby Moors M.D.   On: 04/12/2014 18:41   Dg Chest Port 1 View  04/14/2014   CLINICAL DATA:  Chest pain. Awoke from sleep with nausea and shortness of breath.  EXAM: PORTABLE CHEST - 1 VIEW  COMPARISON:  04/12/2014  FINDINGS: Heart size is normal, mild tortuosity of the thoracic aorta. The lungs are clear. Pulmonary vasculature is normal. No consolidation, pleural effusion, or pneumothorax. No acute osseous abnormalities are seen.  IMPRESSION: No acute pulmonary process.   Electronically Signed   By: Jeb Levering M.D.   On: 04/14/2014 06:49    ASSESSMENT AND PLAN:    Principal Problem:   Atypical chest pain Active Problems:   Ventricular tachycardia   Ischemic cardiomyopathy   CAD (coronary artery disease), native coronary artery   Hyperlipidemia   Pamela Kidd is a 69 y.o. female with a history of dementia, CAD s/p DES to LAD (2013), ischemic CM (EF 40%--> now normalized EF ~60%), HLD, HTN, GERD, oral cancer s/p rad/chemo, emphysema who presented to [facility] ED today with chest pain and dyspnea.   Chest pain/Dyspnea -- Troponin POC neg. And trop I negx1. BNP normal. CXR clear -- Recent negative NST. -- Patient now denying any chest pain.  -- Would rule out over night and discharge in AM if feeling better. Will have a PA or NP see in the next couple weeks  HLD- LDL excellent at 58. Continue statin   HTN- well controlled on current regimen  Dizziness- had an event monitor placed. Unfortunately, we cannot interrogate this over the weekend and will defer to outpatient clinic on Monday -- Tele with no significant arrhythmia while inpatient   Signed: Eileen Stanford, PA-C 04/14/2014 1:50 PM  Pager [phone]  Co-Sign MD  The patient was seen and examined, and I agree with the assessment and plan as documented above, with modifications as noted  below. Pt awoken by sudden onset SOB at 4 am. Denies chest pain. Only complaint at present is a sensation of "something wiggling in chest". As stated above, labs, chest xray, and ECG normal. Given recent unremarkable nuclear stress test earlier this month, no plans for additional cardiac testing. 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 Pylori. Cath in 05/2012 showed patent LAD stent with only other disease being 30% ostial circumflex stenosis. Will await results of cardiac monitoring when device is returned next week. If stable tomorrow, would discharge.

## 2014-04-14 NOTE — ED Notes (Signed)
Pt unable to catch urine specimen

## 2014-04-14 NOTE — ED Provider Notes (Signed)
CSN: [id number]     Arrival date & time 04/14/14  0938 History   First MD Initiated Contact with Patient 04/14/14 0532     Chief Complaint  Patient presents with  . Chest Pain     (Consider location/radiation/quality/duration/timing/severity/associated sxs/prior Treatment) Patient is a 69 y.o. female presenting with chest pain. The history is provided by the patient and the spouse. The history is limited by the condition of the patient (Dementia).  Chest Pain She complained to her husband of a heavy feeling in her chest with associated nausea and dyspnea. These are symptoms she had with a prior heart attack which was treated with a stent. Patient does not remember any of this but states that she is currently without any chest discomfort and without dyspnea or nausea or diaphoresis.  Past Medical History  Diagnosis Date  . Ischemic cardiomyopathy     a. 08/27/2011 Echo: EF 35-40%, mid-dist anteroseptal/apical akinesis, Gr 1 DD, mild-mod TR, PASP 31mmHg  . Hypotension     a. preventing use of ACEI  . Diverticulosis   . Hyperlipidemia     takes Crestor nightly  . Asthmatic bronchitis     "as a child; haven't had any since ~ I was 11 yr old" (06/08/2012)  . Anemia     "I've always been anemic; never had a transfusion" (06/08/2012)  . H/O hiatal hernia   . Oral cancer 2003    a. s/p radiation/chemotherapy  . Emphysema of lung   . GERD (gastroesophageal reflux disease)     takes Protonix daily  . Constipation     takes Miralax daily prn  . CAD (coronary artery disease)     a. 08/26/2011 Acute Ant STEMI/Cath/PCI: LM nl, LAD 100p -> 2.75x23 Xience Xpedition, LCX 20, minor irregs, RCA 20-66mid, EF 30-35%, anterior/apical AK;  b. 05/2012 Cath: LM nl, LAD patent LAD stent, LCX 30ost, RCA dom, nl, EF 65%.  . Complication of anesthesia     slow to wake up  . Vertigo     occasionally  . Bruises easily     d/t taking Plavix and ASA  . History of colon polyps   . Dry mouth   . PONV  (postoperative nausea and vomiting)   . STEMI (ST elevation myocardial infarction) 08/2011    anterior/notes 09/07/2011 (06/08/2012)  . Gastritis    Past Surgical History  Procedure Laterality Date  . Bunionectomy Bilateral 1970's  . Shoulder open rotator cuff repair Left 1990's  . Tonsillectomy  ~ 1951  . Vaginal hysterectomy  ~ 1989    partial  . Tubal ligation  1975  . Coronary angioplasty with stent placement  08/2011    "1" (06/08/2012)  . Cardiac catheterization  06/09/2012    patent prox LAD stent, minor nonobstructive LAD disease, 30% ostial LCx, and otherwise widely patent cors; LVEF 65%  . Colonoscopy    . Peg placement      in 2003 and then removed  . Cholecystectomy  08/18/2012  . Cholecystectomy N/A 08/18/2012    Procedure: LAPAROSCOPIC CHOLECYSTECTOMY WITH INTRAOPERATIVE CHOLANGIOGRAM;  Surgeon: Imogene Burn. Georgette Dover, MD;  Location: [place];  Service: General;  Laterality: N/A;  . Left heart catheterization with coronary angiogram N/A 08/26/2011    Procedure: LEFT HEART CATHETERIZATION WITH CORONARY ANGIOGRAM;  Surgeon: Peter M Martinique, MD;  Location: [facility] CATH LAB;  Service: Cardiovascular;  Laterality: N/A;  . Percutaneous coronary stent intervention (pci-s) N/A 08/26/2011    Procedure: PERCUTANEOUS CORONARY STENT INTERVENTION (PCI-S);  Surgeon: Collier Salina  M Martinique, MD;  Location: [facility] CATH LAB;  Service: Cardiovascular;  Laterality: N/A;  . Left heart catheterization with coronary angiogram N/A 06/09/2012    Procedure: LEFT HEART CATHETERIZATION WITH CORONARY ANGIOGRAM;  Surgeon: Sherren Mocha, MD;  Location: [facility] CATH LAB;  Service: Cardiovascular;  Laterality: N/A;   Family History  Problem Relation Age of Onset  . Dementia Mother   . Heart disease Father   . Cancer Father     prostate  . Heart disease Paternal Grandfather   . Colon cancer Neg Hx    History  Substance Use Topics  . Smoking status: Never Smoker   . Smokeless tobacco: Never Used  . Alcohol Use: No   OB History    No  data available     Review of Systems  Unable to perform ROS: Dementia  Cardiovascular: Positive for chest pain.      Allergies  Ampicillin; Codeine; and Nubain  Home Medications   Prior to Admission medications   Medication Sig Start Date End Date Taking? Authorizing Provider  aspirin EC 81 MG tablet Take 1 tablet (81 mg total) by mouth daily. 09/08/12   Peter M Martinique, MD  nitroGLYCERIN (NITROSTAT) 0.4 MG SL tablet Place 1 tablet (0.4 mg total) under the tongue every 5 (five) minutes x 3 doses as needed for chest pain. 09/08/12 04/06/14  Peter M Martinique, MD  pantoprazole (PROTONIX) 40 MG tablet Take 1 tablet (40 mg total) by mouth daily before breakfast. 02/27/14   Milus Banister, MD  rosuvastatin (CRESTOR) 20 MG tablet Take 1 tablet (20 mg total) by mouth daily. 08/14/13   Peter M Martinique, MD   BP 141/79 mmHg  Temp(Src) 98.5 F (36.9 C) (Oral)  Resp 24  SpO2 98% Physical Exam  Nursing note and vitals reviewed.  69 year old female, resting comfortably and in no acute distress. Vital signs are seen again for borderline hypertension and also for tachypnea. Oxygen saturation is 98%, which is normal. Head is normocephalic and atraumatic. PERRLA, EOMI. Oropharynx is clear. Neck is nontender and supple without adenopathy or JVD. Back is nontender and there is no CVA tenderness. Lungs are clear without rales, wheezes, or rhonchi. Chest is nontender. Heart has regular rate and rhythm without murmur. Abdomen is soft, flat, nontender without masses or hepatosplenomegaly and peristalsis is normoactive. Extremities have no cyanosis or edema, full range of motion is present. Skin is warm and dry without rash. Neurologic: Mental status is normal, cranial nerves are intact, there are no motor or sensory deficits.  ED Course  Procedures (including critical care time) Labs Review Results for orders placed or performed during the hospital encounter of 57/84/69  Basic metabolic panel  Result  Value Ref Range   Sodium 141 135 - 145 mmol/L   Potassium 3.5 3.5 - 5.1 mmol/L   Chloride 107 96 - 112 mmol/L   CO2 28 19 - 32 mmol/L   Glucose, Bld 123 (H) 70 - 99 mg/dL   BUN 9 6 - 23 mg/dL   Creatinine, Ser 1.15 (H) 0.50 - 1.10 mg/dL   Calcium 9.3 8.4 - 10.5 mg/dL   GFR calc non Af Amer 48 (L) >90 mL/min   GFR calc Af Amer 55 (L) >90 mL/min   Anion gap 6 5 - 15  BNP (order ONLY if patient complains of dyspnea/SOB AND you have documented it for THIS visit)  Result Value Ref Range   B Natriuretic Peptide 29.3 0.0 - 100.0 pg/mL  CBC with  Differential  Result Value Ref Range   WBC 7.3 4.0 - 10.5 K/uL   RBC 4.64 3.87 - 5.11 MIL/uL   Hemoglobin 13.7 12.0 - 15.0 g/dL   HCT 42.1 36.0 - 46.0 %   MCV 90.7 78.0 - 100.0 fL   MCH 29.5 26.0 - 34.0 pg   MCHC 32.5 30.0 - 36.0 g/dL   RDW 13.4 11.5 - 15.5 %   Platelets 228 150 - 400 K/uL   Neutrophils Relative % 76 43 - 77 %   Neutro Abs 5.5 1.7 - 7.7 K/uL   Lymphocytes Relative 16 12 - 46 %   Lymphs Abs 1.1 0.7 - 4.0 K/uL   Monocytes Relative 6 3 - 12 %   Monocytes Absolute 0.5 0.1 - 1.0 K/uL   Eosinophils Relative 1 0 - 5 %   Eosinophils Absolute 0.1 0.0 - 0.7 K/uL   Basophils Relative 1 0 - 1 %   Basophils Absolute 0.1 0.0 - 0.1 K/uL  I-stat troponin, ED (not at [facility])  Result Value Ref Range   Troponin i, poc 0.00 0.00 - 0.08 ng/mL   Comment 3           Imaging Review Dg Chest 2 View  04/12/2014   CLINICAL DATA:  Shortness of breath for 1 month. Irregular heart rhythm.  EXAM: CHEST  2 VIEW  COMPARISON:  05/20/2012  FINDINGS: The heart size and mediastinal contours are within normal limits. Both lungs are clear. The visualized skeletal structures are unremarkable.  IMPRESSION: No active cardiopulmonary disease.   Electronically Signed   By: Kerby Moors M.D.   On: 04/12/2014 18:41     EKG Interpretation   Date/Time:  Saturday April 14 2014 05:24:29 EDT Ventricular Rate:  65 PR Interval:  149 QRS Duration: 74 QT Interval:   388 QTC Calculation: 403 R Axis:   30 Text Interpretation:  Sinus rhythm Low voltage, precordial leads When  compared with ECG of 12/23/2012, No significant change was found Confirmed  by [facility]  MD, Jayln Madeira ([id number]) on 04/14/2014 5:30:33 AM      MDM   Final diagnoses:  Chest pain, unspecified chest pain type    Chest discomfort. Patient is not able to give any relevant history but review of old records does show prior MI treated with stent. A catheterization in May 2014 showed patent stent and only minor obstructive disease elsewhere with the worst lesion being 30% in circumflex. I doubt that this is going to prove to be a cardiac event, but given her history, she will need to be kept for serial troponins.  Initial workup is unremarkable. Case is discussed with Dr. Algis Liming of [facility] who has requested cardiology evaluate the patient first. Case is discussed with Dr. Havery Moros of cardiology service who agrees to evaluate the patient in the ED.   Delora Fuel, MD 46/80/32 1224

## 2014-04-14 NOTE — ED Notes (Signed)
Pt denies the fluttering feeling in her chest.

## 2014-04-14 NOTE — Progress Notes (Signed)
UR completed 

## 2014-04-14 NOTE — ED Notes (Addendum)
Per EMS, pt woke up because of a fluttery feeling in her mid chest. Pt also complaining of nausea and SOB. Pt had an MI 3 years ago with similar symptoms. Pt is wearing a monitor, but is unable to say what is being monitored. Pt is confused, but unsure if this is baseline. EMS states that she did not appear confused on seen. Pt is oriented to person and place. Pt repeats questions.

## 2014-04-15 DIAGNOSIS — E785 Hyperlipidemia, unspecified: Secondary | ICD-10-CM | POA: Diagnosis not present

## 2014-04-15 DIAGNOSIS — I255 Ischemic cardiomyopathy: Secondary | ICD-10-CM | POA: Diagnosis not present

## 2014-04-15 DIAGNOSIS — I25118 Atherosclerotic heart disease of native coronary artery with other forms of angina pectoris: Secondary | ICD-10-CM

## 2014-04-15 DIAGNOSIS — R0789 Other chest pain: Secondary | ICD-10-CM | POA: Diagnosis not present

## 2014-04-15 NOTE — Discharge Summary (Signed)
Physician Discharge Summary  Pamela Kidd GMW:[id number] DOB: 19-Feb-1945 DOA: 04/14/2014  PCP: [name], MD  Admit date: 04/14/2014 Discharge date: 04/15/2014  Time spent:35 minutes  Recommendations for Outpatient Follow-up:  Patient will be discharged home. She should follow-up with her cardiologist at the specified time. Patient should also follow-up with her primary care physician within one week of discharge. Patient should continue a heart healthy diet. Patient should continue her medications as prescribed.  Discharge Diagnoses:  Atypical chest pain Possible dyspnea Ischemic myopathy Hyperlipidemia Dizziness GERD  Discharge Condition: Stable  Diet recommendation: Heart healthy  Filed Weights   04/14/14 1026  Weight: 40.8 kg (89 lb 15.2 oz)    History of present illness:  By Dr. Vernell Leep on 04/14/2014 Pamela Kidd is a 69 y.o. female with history of dementia, MI, CAD, last 5/14-LM normal, patent LAD stent and EF 65%, ischemic cardiomyopathy, hyperlipidemia, oral cancer status post radiation and chemotherapy, emphysema, GERD, presented to the ED with complaints of chest pain and dyspnea. Patient is a poor historian secondary to dementia related memory deficits and confusion. Patient's spouse is unable to add significantly to the history. As per both of them, patient has chronic mid back pain between the shoulder blades. 2 days ago while watching TV, she complained of midsternal chest pain which she describes as mild and states that it was nothing like the time that she had her heart attack, cannot tell if it was radiating or associated with any other symptoms except mid back pain and apparently resolved spontaneously after 30 minutes. She does not have frequent chest pains. She does complain of intermittently having "difficulty taking deep breaths" but denies cough, fever or chills. No history of recent long-distance travel. As per previous chart review, she has an  event monitor to evaluate her dizziness which she did not complain of during today's visit. Patient currently does not complain of chest pain or dyspnea. EDP discussed her care with cardiologist on call who recommended hospitalist admission for ACS rule out and further evaluation.   Hospital Course:  Atypical chest pain -Unable to obtain information secondary to history of dementia -Patient denies any current chest pain -has significant history of coronary artery disease with PCI ischemic cardiomyopathy -Cardiology consulted and appreciated, did not recommend further workup at this time, will follow event monitor in one week -Cardiac troponins monitored bed negative 3, EKG negative for acute changes -Patient had cardiac cath in 2014 which showed LAD stent patent, 30% ostial circumflex stenosis -Patient had unremarkable nuclear stress test March 2016 -Continue aspirin, statin  Possible dyspnea -Patient denies any current shortness of breath -Chest x-ray negative -Low index of suspicion for pulmonary embolism -Currently euvolemic  Ischemic Cardiomyopathy -Cardiology consulted and appreciated -Last EF 60% -Currently euvolemic  Hyperlipidemia -Continue statin  Dizziness -No further complaints of dizziness, unclear etiology -Questionable arrhythmia, patient has event monitor which will be monitored as an outpatient in one week -orthostatic vitals negative  GERD -Continue PPI  Procedures:  None  Consultations:  Cardiology  Discharge Exam: Filed Vitals:   04/15/14 0453  BP: 106/68  Pulse: 66  Temp: 98.4 F (36.9 C)  Resp: 18     General: Well developed, well nourished, appears stated age  HEENT: NCAT,  membranes moist.  Cardiovascular: S1 S2 auscultated  Respiratory: Clear to auscultation bilaterally  Extremities: warm dry without cyanosis clubbing or edema  Neuro: AAOx2, nonfocal  Discharge Instructions      Discharge Instructions    Discharge  instructions  Complete by:  As directed   Patient will be discharged home. She should follow-up with her cardiologist at the specified time. Patient should also follow-up with her primary care physician within one week of discharge. Patient should continue a heart healthy diet. Patient should continue her medications as prescribed.  You were cared for by a hospitalist during your hospital stay. If you have any questions about your discharge medications or the care you received while you were in the hospital after you are discharged, you can call the unit and asked to speak with the hospitalist on call if the hospitalist that took care of you is not available. Once you are discharged, your primary care physician will handle any further medical issues. Please note that NO REFILLS for any discharge medications will be authorized once you are discharged, as it is imperative that you return to your primary care physician (or establish a relationship with a primary care physician if you do not have one) for your aftercare needs so that they can reassess your need for medications and monitor your lab values.            Medication List    TAKE these medications        aspirin EC 81 MG tablet  Take 1 tablet (81 mg total) by mouth daily.     ibuprofen 200 MG tablet  Commonly known as:  ADVIL,MOTRIN  Take 400 mg by mouth every 6 (six) hours as needed for headache.     nitroGLYCERIN 0.4 MG SL tablet  Commonly known as:  NITROSTAT  Place 1 tablet (0.4 mg total) under the tongue every 5 (five) minutes x 3 doses as needed for chest pain.     pantoprazole 40 MG tablet  Commonly known as:  PROTONIX  Take 1 tablet (40 mg total) by mouth daily before breakfast.     rosuvastatin 20 MG tablet  Commonly known as:  CRESTOR  Take 1 tablet (20 mg total) by mouth daily.       Allergies  Allergen Reactions  . Ampicillin Itching  . Codeine Itching and Nausea And Vomiting  . Nubain [Nalbuphine Hcl] Other  (See Comments)    "headache & disoriented"     Follow-up Information    Follow up with [facility] TOM, MD. Schedule an appointment as soon as possible for a visit in 1 week.   Specialty:  Family Medicine   Why:  Hospital follow-up   Contact information:   [address] [place] [address] [phone]       Follow up with Peter Martinique, MD. Schedule an appointment as soon as possible for a visit in 1 week.   Specialty:  Cardiology   Why:  Hospital follow up, event monitor   Contact information:   [address] [place] [address] [phone]        The results of significant diagnostics from this hospitalization (including imaging, microbiology, ancillary and laboratory) are listed below for reference.    Significant Diagnostic Studies: Dg Chest 2 View  04/12/2014   CLINICAL DATA:  Shortness of breath for 1 month. Irregular heart rhythm.  EXAM: CHEST  2 VIEW  COMPARISON:  05/20/2012  FINDINGS: The heart size and mediastinal contours are within normal limits. Both lungs are clear. The visualized skeletal structures are unremarkable.  IMPRESSION: No active cardiopulmonary disease.   Electronically Signed   By: Kerby Moors M.D.   On: 04/12/2014 18:41   Dg Chest [facility]  1 View  04/14/2014   CLINICAL DATA:  Chest pain. Awoke from sleep with nausea and shortness of breath.  EXAM: PORTABLE CHEST - 1 VIEW  COMPARISON:  04/12/2014  FINDINGS: Heart size is normal, mild tortuosity of the thoracic aorta. The lungs are clear. Pulmonary vasculature is normal. No consolidation, pleural effusion, or pneumothorax. No acute osseous abnormalities are seen.  IMPRESSION: No acute pulmonary process.   Electronically Signed   By: Jeb Levering M.D.   On: 04/14/2014 06:49    Microbiology: No results found for this or any previous visit (from the past 240 hour(s)).   Labs: Basic Metabolic Panel:  Recent Labs Lab 04/14/14 0531  NA 141  K 3.5  CL 107  CO2 28    GLUCOSE 123*  BUN 9  CREATININE 1.15*  CALCIUM 9.3   Liver Function Tests:  Recent Labs Lab 04/14/14 0952  AST 19  ALT 11  ALKPHOS 74  BILITOT 0.5  PROT 6.0  ALBUMIN 3.6    Recent Labs Lab 04/14/14 0952  LIPASE 34   No results for input(s): AMMONIA in the last 168 hours. CBC:  Recent Labs Lab 04/14/14 0531  WBC 7.3  NEUTROABS 5.5  HGB 13.7  HCT 42.1  MCV 90.7  PLT 228   Cardiac Enzymes:  Recent Labs Lab 04/14/14 1239 04/14/14 1653 04/14/14 2147  TROPONINI <0.03 <0.03 <0.03   BNP: BNP (last 3 results)  Recent Labs  04/14/14 0531  BNP 29.3    ProBNP (last 3 results) No results for input(s): PROBNP in the last 8760 hours.  CBG: No results for input(s): GLUCAP in the last 168 hours.     SignedCristal Ford  [facility] 04/15/2014, 10:14 AM

## 2014-04-15 NOTE — Discharge Instructions (Signed)

## 2014-04-15 NOTE — Progress Notes (Signed)
Patient Name: Pamela Kidd Date of Encounter: 04/15/2014  Principal Problem:   Atypical chest pain Active Problems:   Ventricular tachycardia   Ischemic cardiomyopathy   CAD (coronary artery disease), native coronary artery   Hyperlipidemia   Length of Stay:   SUBJECTIVE  Currently asymptomatic All cardiac labs normal Extensive recent cardiac w/u reassuring  CURRENT MEDS . aspirin EC  81 mg Oral Daily  . enoxaparin (LOVENOX) injection  20 mg Subcutaneous Q24H  . pantoprazole  40 mg Oral QAC breakfast  . rosuvastatin  20 mg Oral Daily  . sodium chloride  3 mL Intravenous Q12H    OBJECTIVE   Intake/Output Summary (Last 24 hours) at 04/15/14 1009 Last data filed at 04/15/14 0300  Gross per 24 hour  Intake      0 ml  Output    600 ml  Net   -600 ml   Filed Weights   04/14/14 1026  Weight: 89 lb 15.2 oz (40.8 kg)    PHYSICAL EXAM Filed Vitals:   04/14/14 1026 04/14/14 1508 04/14/14 1958 04/15/14 0453  BP: 116/68 109/68 101/64 106/68  Pulse: 66 83 77 66  Temp: 98.4 F (36.9 C) 98.4 F (36.9 C) 98.7 F (37.1 C) 98.4 F (36.9 C)  TempSrc: Oral Oral Oral Oral  Resp:  20 18 18   Height: 4\' 10"  (1.473 m)     Weight: 89 lb 15.2 oz (40.8 kg)     SpO2: 98% 95% 99% 99%   General: Alert, oriented x3, no distress Head: no evidence of trauma, PERRL, EOMI, no exophtalmos or lid lag, no myxedema, no xanthelasma; normal ears, nose and oropharynx Neck: normal jugular venous pulsations and no hepatojugular reflux; brisk carotid pulses without delay and no carotid bruits Chest: clear to auscultation, no signs of consolidation by percussion or palpation, normal fremitus, symmetrical and full respiratory excursions Cardiovascular: normal position and quality of the apical impulse, regular rhythm, normal first and second heart sounds, no rubs or gallops, no murmur Abdomen: no tenderness or distention, no masses by palpation, no abnormal pulsatility or arterial bruits, normal  bowel sounds, no hepatosplenomegaly Extremities: no clubbing, cyanosis or edema; 2+ radial, ulnar and brachial pulses bilaterally; 2+ right femoral, posterior tibial and dorsalis pedis pulses; 2+ left femoral, posterior tibial and dorsalis pedis pulses; no subclavian or femoral bruits Neurological: grossly nonfocal  LABS  CBC  Recent Labs  04/14/14 0531  WBC 7.3  NEUTROABS 5.5  HGB 13.7  HCT 42.1  MCV 90.7  PLT 157   Basic Metabolic Panel  Recent Labs  04/14/14 0531  NA 141  K 3.5  CL 107  CO2 28  GLUCOSE 123*  BUN 9  CREATININE 1.15*  CALCIUM 9.3   Liver Function Tests  Recent Labs  04/14/14 0952  AST 19  ALT 11  ALKPHOS 74  BILITOT 0.5  PROT 6.0  ALBUMIN 3.6    Recent Labs  04/14/14 0952  LIPASE 34   Cardiac Enzymes  Recent Labs  04/14/14 1239 04/14/14 1653 04/14/14 2147  TROPONINI <0.03 <0.03 <0.03   BNP Invalid input(s): POCBNP D-Dimer No results for input(s): DDIMER in the last 72 hours. Hemoglobin A1C No results for input(s): HGBA1C in the last 72 hours. Fasting Lipid Panel No results for input(s): CHOL, HDL, LDLCALC, TRIG, CHOLHDL, LDLDIRECT in the last 72 hours. Thyroid Function Tests No results for input(s): TSH, T4TOTAL, T3FREE, THYROIDAB in the last 72 hours.  Invalid input(s): [place]  Radiology Studies Imaging results have been reviewed  and Dg Chest Port 1 View  04/14/2014   CLINICAL DATA:  Chest pain. Awoke from sleep with nausea and shortness of breath.  EXAM: PORTABLE CHEST - 1 VIEW  COMPARISON:  04/12/2014  FINDINGS: Heart size is normal, mild tortuosity of the thoracic aorta. The lungs are clear. Pulmonary vasculature is normal. No consolidation, pleural effusion, or pneumothorax. No acute osseous abnormalities are seen.  IMPRESSION: No acute pulmonary process.   Electronically Signed   By: Jeb Levering M.D.   On: 04/14/2014 06:49    TELE NSR   ASSESSMENT AND PLAN No arrhythmia overnight Continue wearing event  monitor. F/u as outlined in Dr. Court Joy note yesterday   Sanda Klein, MD, [facility] [phone] office [phone] pager 04/15/2014 10:09 AM

## 2014-04-16 ENCOUNTER — Telehealth: Payer: Self-pay | Admitting: *Deleted

## 2014-04-16 DIAGNOSIS — H9190 Unspecified hearing loss, unspecified ear: Secondary | ICD-10-CM | POA: Insufficient documentation

## 2014-04-16 NOTE — Telephone Encounter (Signed)
Received fax from [place] [facility] for pt to return home  Date of admit:04/14/14  Date of discharge:04/15/14  Level of Care:observation  Discharging facility:[facility]  Attending physician:Mikhail,Mary Ann  MQK:MMNOTR T. Pickard,MD  Discharge Dispostion:Discharged to home,self care  DX:R07.9-chest pain,unspecified

## 2014-04-16 NOTE — Telephone Encounter (Signed)
Received fax from [facility] management Dept stating that pt has been admitted to acute care hospital  Admit date:04/14/14  Dx: R07.9-chest pain,unspecified  Admitting physician: Lupita Dawn, DO  Pending [id number]

## 2014-04-17 ENCOUNTER — Encounter: Payer: Commercial Managed Care - HMO | Admitting: Family Medicine

## 2014-04-17 ENCOUNTER — Encounter: Payer: Self-pay | Admitting: Family Medicine

## 2014-04-17 NOTE — Progress Notes (Signed)
   Subjective:    Patient ID: Unk Lightning, female    DOB: 1945-11-25, 69 y.o.   MRN: [id number]  HPI    Review of Systems     Objective:   Physical Exam        Assessment & Plan:   This encounter was created in error - please disregard.

## 2014-04-23 ENCOUNTER — Inpatient Hospital Stay: Payer: Commercial Managed Care - HMO | Admitting: Family Medicine

## 2014-04-26 ENCOUNTER — Telehealth: Payer: Self-pay | Admitting: *Deleted

## 2014-04-26 ENCOUNTER — Encounter: Payer: Self-pay | Admitting: Family Medicine

## 2014-04-26 ENCOUNTER — Ambulatory Visit (INDEPENDENT_AMBULATORY_CARE_PROVIDER_SITE_OTHER): Payer: Commercial Managed Care - HMO | Admitting: Family Medicine

## 2014-04-26 VITALS — BP 100/68 | HR 78 | Temp 98.2°F | Resp 14 | Ht <= 58 in | Wt 88.0 lb

## 2014-04-26 DIAGNOSIS — F0390 Unspecified dementia without behavioral disturbance: Secondary | ICD-10-CM | POA: Diagnosis not present

## 2014-04-26 DIAGNOSIS — Z09 Encounter for follow-up examination after completed treatment for conditions other than malignant neoplasm: Secondary | ICD-10-CM

## 2014-04-26 DIAGNOSIS — F0391 Unspecified dementia with behavioral disturbance: Secondary | ICD-10-CM

## 2014-04-26 DIAGNOSIS — F03918 Unspecified dementia, unspecified severity, with other behavioral disturbance: Secondary | ICD-10-CM | POA: Insufficient documentation

## 2014-04-26 DIAGNOSIS — F410 Panic disorder [episodic paroxysmal anxiety] without agoraphobia: Secondary | ICD-10-CM | POA: Diagnosis not present

## 2014-04-26 MED ORDER — DONEPEZIL HCL 5 MG PO TABS: ORAL_TABLET | ORAL | Status: DC

## 2014-04-26 MED ORDER — LORAZEPAM 0.5 MG PO TABS: 0.5000 mg | ORAL_TABLET | Freq: Two times a day (BID) | ORAL | Status: DC | PRN

## 2014-04-26 NOTE — Progress Notes (Signed)
Subjective:    Patient ID: Unk Pamela Kidd, female    DOB: [birth year], 69 y.o.   MRN: [id number]  HPI Patient was admitted to the hospital March 27 with acute onset of shortness of breath and chest tightness while sleeping. She was kept in the hospital for observation as were cardiac enzymes were negative 3. Chest x-ray was normal. EKG was normal. Of note the patient also had a normal stress test March 2 of this year and a clear catheterization in 2014. Patient's symptoms sound like a panic attack. She has a difficult time putting into words exactly what it feels like her husband reports that she is having episodes once or twice a week where she will suddenly complained of shortness of breath and inability to breathe and anxiety. Of note I evaluated the patient for memory problems in November. At that time her Mini-Mental status exam was 25 out of 30. I repeated this today and today she was at best 20 out of 30. She seems noticeably more confused. She is unable to tell me the month. She is unable to tell me the day. She is unable to perform world which she could perform last time. She also has difficulty finding the names of objects. Past Medical History  Diagnosis Date  . Ischemic cardiomyopathy     a. 08/27/2011 Echo: EF 35-40%, mid-dist anteroseptal/apical akinesis, Gr 1 DD, mild-mod TR, PASP 43mmHg  . Hypotension     a. preventing use of ACEI  . Diverticulosis   . Hyperlipidemia     takes Crestor nightly  . Asthmatic bronchitis     "as a child; haven't had any since ~ I was 35 yr old" (06/08/2012)  . Anemia     "I've always been anemic; never had a transfusion" (06/08/2012)  . H/O hiatal hernia   . Oral cancer 2003    a. s/p radiation/chemotherapy  . Emphysema of lung   . GERD (gastroesophageal reflux disease)     takes Protonix daily  . Constipation     takes Miralax daily prn  . CAD (coronary artery disease)     a. 08/26/2011 Acute Ant STEMI/Cath/PCI: LM nl, LAD 100p -> 2.75x23 Xience  Xpedition, LCX 20, minor irregs, RCA 20-18mid, EF 30-35%, anterior/apical AK;  b. 05/2012 Cath: LM nl, LAD patent LAD stent, LCX 30ost, RCA dom, nl, EF 65%.  . Complication of anesthesia     slow to wake up  . Vertigo     occasionally  . Bruises easily     d/t taking Plavix and ASA  . History of colon polyps   . Dry mouth   . PONV (postoperative nausea and vomiting)   . STEMI (ST elevation myocardial infarction) 08/2011    anterior/notes 09/07/2011 (06/08/2012)  . Gastritis   . Dementia    Past Surgical History  Procedure Laterality Date  . Bunionectomy Bilateral 1970's  . Shoulder open rotator cuff repair Left 1990's  . Tonsillectomy  ~ 1951  . Vaginal hysterectomy  ~ 1989    partial  . Tubal ligation  1975  . Coronary angioplasty with stent placement  08/2011    "1" (06/08/2012)  . Cardiac catheterization  06/09/2012    patent prox LAD stent, minor nonobstructive LAD disease, 30% ostial LCx, and otherwise widely patent cors; LVEF 65%  . Colonoscopy    . Peg placement      in 2003 and then removed  . Cholecystectomy  08/18/2012  . Cholecystectomy N/A 08/18/2012  Procedure: LAPAROSCOPIC CHOLECYSTECTOMY WITH INTRAOPERATIVE CHOLANGIOGRAM;  Surgeon: Imogene Burn. Georgette Dover, MD;  Location: [place];  Service: General;  Laterality: N/A;  . Left heart catheterization with coronary angiogram N/A 08/26/2011    Procedure: LEFT HEART CATHETERIZATION WITH CORONARY ANGIOGRAM;  Surgeon: Peter M Martinique, MD;  Location: [facility] CATH LAB;  Service: Cardiovascular;  Laterality: N/A;  . Percutaneous coronary stent intervention (pci-s) N/A 08/26/2011    Procedure: PERCUTANEOUS CORONARY STENT INTERVENTION (PCI-S);  Surgeon: Peter M Martinique, MD;  Location: [facility] CATH LAB;  Service: Cardiovascular;  Laterality: N/A;  . Left heart catheterization with coronary angiogram N/A 06/09/2012    Procedure: LEFT HEART CATHETERIZATION WITH CORONARY ANGIOGRAM;  Surgeon: Sherren Mocha, MD;  Location: [facility] CATH LAB;  Service: Cardiovascular;   Laterality: N/A;   Current Outpatient Prescriptions on File Prior to Visit  Medication Sig Dispense Refill  . aspirin EC 81 MG tablet Take 1 tablet (81 mg total) by mouth daily. 90 tablet 3  . nitroGLYCERIN (NITROSTAT) 0.4 MG SL tablet Place 1 tablet (0.4 mg total) under the tongue every 5 (five) minutes x 3 doses as needed for chest pain. 30 tablet 3  . pantoprazole (PROTONIX) 40 MG tablet Take 1 tablet (40 mg total) by mouth daily before breakfast. 90 tablet 3  . rosuvastatin (CRESTOR) 20 MG tablet Take 1 tablet (20 mg total) by mouth daily. 90 tablet 3   No current facility-administered medications on file prior to visit.   Allergies  Allergen Reactions  . Ampicillin Itching  . Codeine Itching and Nausea And Vomiting  . Nubain [Nalbuphine Hcl] Other (See Comments)    "headache & disoriented"     History   Social History  . Marital Status: Married    Spouse Name: N/A  . Number of Children: 4  . Years of Education: N/A   Occupational History  . Retired    Social History Main Topics  . Smoking status: Never Smoker   . Smokeless tobacco: Never Used  . Alcohol Use: No  . Drug Use: No  . Sexual Activity: No   Other Topics Concern  . Not on file   Social History Narrative      Review of Systems  All other systems reviewed and are negative.      Objective:   Physical Exam  Constitutional: She is oriented to person, place, and time.  Cardiovascular: Normal rate, regular rhythm and normal heart sounds.   No murmur heard. Pulmonary/Chest: Effort normal and breath sounds normal. No respiratory distress. She has no wheezes. She has no rales.  Abdominal: Soft. Bowel sounds are normal. She exhibits no distension. There is no tenderness. There is no rebound and no guarding.  Neurological: She is alert and oriented to person, place, and time. She has normal reflexes. She displays normal reflexes. No cranial nerve deficit. She exhibits normal muscle tone. Coordination  normal.  Vitals reviewed.         Assessment & Plan:  Dementia, without behavioral disturbance - Plan: donepezil (ARICEPT) 5 MG tablet  Panic attack - Plan: LORazepam (ATIVAN) 0.5 MG tablet  Hospital discharge follow-up  I believe the patient is using worsening of her dementia and memory loss. Therefore I recommend we begin Aricept 5 mg by mouth daily. In 2 weeks increase to 10 mg by mouth daily. Recheck in 1-2 months. She is tolerating the medication without complications, at that time I will add Namenda. I believe the worsening dementia is likely contributing to the anxiety and panic attacks. I  did give her husband a prescription for Ativan 0.5 mg. Only gave her 10 tablets. He can give her 1 tablet every day as needed for severe panic attacks to prevent requiring them to go to the hospital. I cautioned him against using the medication because it could actually exacerbate confusion and memory problems.

## 2014-04-26 NOTE — Telephone Encounter (Signed)
Received call from [place] daughter in law stating that pt family and herself has been noticing some memory changes and mood behaviors within the last couple of weeks, states she knew that pt has appt with Dennard Schaumann today and he prescribed her a couple new medications but also is wanting to know if you can refer her to a neurologist for the memory changes, also stated that pt's mother had alzheimers and feels like she needs to be checked for this also, Mechele Claude states he would like for pt to be referred to [facility] Neurology Dr. Sherron Ales. Please advise!  ?ok to place referral to Neurology

## 2014-04-26 NOTE — Telephone Encounter (Signed)
[place] with referral but we diagnosed her with dementia today and started her on med for this.

## 2014-04-27 ENCOUNTER — Other Ambulatory Visit: Payer: Self-pay | Admitting: Family Medicine

## 2014-04-27 ENCOUNTER — Telehealth: Payer: Self-pay | Admitting: Cardiology

## 2014-04-27 MED ORDER — DONEPEZIL HCL 10 MG PO TABS: 10.0000 mg | ORAL_TABLET | Freq: Every day | ORAL | Status: DC

## 2014-04-27 NOTE — Telephone Encounter (Signed)
Pt is calling in to get the results from her heart monitor. Please f/u with her  Thanks

## 2014-04-27 NOTE — Telephone Encounter (Signed)
Referral place to neurology

## 2014-04-27 NOTE — Telephone Encounter (Signed)
Returned call to patient no answer.LMTC. 

## 2014-04-30 ENCOUNTER — Inpatient Hospital Stay: Payer: Commercial Managed Care - HMO | Admitting: Family Medicine

## 2014-04-30 ENCOUNTER — Ambulatory Visit: Payer: Commercial Managed Care - HMO | Admitting: Gastroenterology

## 2014-04-30 NOTE — Telephone Encounter (Signed)
Can this encounter be closed?

## 2014-04-30 NOTE — Telephone Encounter (Signed)
Spoke to patient Dr.Jordan reviewed event monitor which revealed NSR with rare PVC,PAC.Advised to keep appointment with Dr.Jordan 05/16/14 at 10:15 am.

## 2014-05-04 ENCOUNTER — Encounter: Payer: Self-pay | Admitting: Nurse Practitioner

## 2014-05-04 ENCOUNTER — Ambulatory Visit (INDEPENDENT_AMBULATORY_CARE_PROVIDER_SITE_OTHER): Payer: Commercial Managed Care - HMO | Admitting: Nurse Practitioner

## 2014-05-04 VITALS — BP 126/72 | HR 80 | Ht <= 58 in | Wt 87.2 lb

## 2014-05-04 DIAGNOSIS — K2970 Gastritis, unspecified, without bleeding: Secondary | ICD-10-CM

## 2014-05-04 NOTE — Patient Instructions (Signed)
Continue the same medications. Limit your Ibuprofen intake.

## 2014-05-04 NOTE — Progress Notes (Signed)
     History of Present Illness:   Patient is a 69 year old female known to Dr. Ardis Hughs. She had an upper endoscopy February of this year for evaluation of weight loss and early satiety. A fundic gland polyp and reactive gastropathy were found. Patient is here for a post procedure folllow up.   Current Medications, Allergies, Past Medical History, Past Surgical History, Family History and Social History were reviewed in [employer] record.   Physical Exam: General: Pleasant, well developed , white female in no acute distress Head: Normocephalic and atraumatic Eyes:  sclerae anicteric, conjunctiva pink  Ears: Normal auditory acuity Lungs: Clear throughout to auscultation Heart: Regular rate and rhythm Abdomen: Soft, non distended, non-tender. No masses, no hepatomegaly. Normal bowel sounds Musculoskeletal: Symmetrical with no gross deformities  Extremities: No edema  Neurological: Alert oriented x 4, grossly nonfocal Psychological:  Alert and cooperative. Normal mood and affect  Assessment and Recommendations:  69 year old female here for EGD follow up. She underwent EGD for weight loss and early satiety. Findings included moderate gastritis (path c/w reactive gastropathy) and a fundic gland polyp. Patient feels much better, taking daily PPI and twice daily Carafate. Her weight has stabilized. Continue current regimen. Follow up as needed.

## 2014-05-06 ENCOUNTER — Encounter: Payer: Self-pay | Admitting: Nurse Practitioner

## 2014-05-07 DIAGNOSIS — K2970 Gastritis, unspecified, without bleeding: Secondary | ICD-10-CM | POA: Insufficient documentation

## 2014-05-07 NOTE — Progress Notes (Signed)
i agree with the above note, plan 

## 2014-05-08 ENCOUNTER — Telehealth: Payer: Self-pay | Admitting: *Deleted

## 2014-05-08 NOTE — Telephone Encounter (Signed)
Submitted [employer] referral thru acuity connect for authorization to Dr. Peter Martinique, MD cardiologist with authorization number [id number]  Requesting provider: Cammie Mcgee. [place]  Treating provider: Peter Martinique, MD  Number of visits:6  Start Date: 05/16/14  End Date:11/12/14  Dx:I25.118-athscl heart disease of native cor art w oth ang pectrs      E78.0- Pure hypercholesterolemia      I25.2-Old myocardial infarction      R55-syncope and collapse  Copy has been faxed to [facility] for records and reveiw

## 2014-05-08 NOTE — Telephone Encounter (Signed)
Submitted humana referral thru acuity connect for authorization to Dr. Charise Killian Neurologist with authorization [id number]  Requesting provider:Warren T. [place]ard,MD  Treating provider: Charise Killian  Number of visits:6  Start Date:05/14/14  End Date:11/10/14  Dx:F03.91-unspecified dementia with behavioral disturbance  Copy has been sent to Dr. Verdene Rio at [facility] Neu[facility] for records/review

## 2014-05-15 ENCOUNTER — Encounter: Payer: Self-pay | Admitting: Family Medicine

## 2014-05-15 ENCOUNTER — Other Ambulatory Visit: Payer: Self-pay | Admitting: *Deleted

## 2014-05-15 ENCOUNTER — Ambulatory Visit (INDEPENDENT_AMBULATORY_CARE_PROVIDER_SITE_OTHER): Payer: Commercial Managed Care - HMO | Admitting: Family Medicine

## 2014-05-15 ENCOUNTER — Ambulatory Visit
Admission: RE | Admit: 2014-05-15 | Discharge: 2014-05-15 | Disposition: A | Discharge status: Home / Self Care | Payer: Commercial Managed Care - HMO | Source: Ambulatory Visit | Attending: Family Medicine | Admitting: Family Medicine

## 2014-05-15 VITALS — BP 130/74 | HR 76 | Temp 98.8°F | Resp 16 | Ht <= 58 in | Wt 89.0 lb

## 2014-05-15 DIAGNOSIS — S99922A Unspecified injury of left foot, initial encounter: Secondary | ICD-10-CM | POA: Diagnosis not present

## 2014-05-15 MED ORDER — TRAMADOL HCL 50 MG PO TABS: 50.0000 mg | ORAL_TABLET | Freq: Three times a day (TID) | ORAL | Status: DC | PRN

## 2014-05-15 NOTE — Patient Instructions (Signed)
Get xray done I will call with results We will call in pain medication

## 2014-05-15 NOTE — Progress Notes (Signed)
Patient ID: Pamela Kidd, female   DOB: 06/23/1945, 69 y.o.   MRN: [id number]   Subjective:    Patient ID: Pamela Kidd, female    DOB: 12/10/1945, 69 y.o.   MRN: [id number]  Patient presents for L Great Toe Infection   issue here with left great toe pain. She states she was at the beach about 3 weeks ago she was walking her dogs when she tripped and fell and stumped her toe. She was actually wearing tennis shoes. For the past couple weeks she's had swelling and pain of the toes she is unable to bend her great toe as normal. She was concerned it became infected but she's not had any drainage no fever. She typically does not have any pain until she is walking on her foot for many hours. She's been taking Aleve which helps some.   Husband- [phone]  Review Of Systems:  GEN- denies fatigue, fever, weight loss,weakness, recent illness HEENT- denies eye drainage, change in vision, nasal discharge, CVS- denies chest pain, palpitations RESP- denies SOB, cough, wheeze MSK- + joint pain, muscle aches, injury Neuro- denies headache, dizziness, syncope, seizure activity       Objective:    BP 130/74 mmHg  Pulse 76  Temp(Src) 98.8 F (37.1 C) (Oral)  Resp 16  Ht 4\' 8"  (1.422 m)  Wt 89 lb (40.37 kg)  BMI 19.96 kg/m2 GEN- NAD, alert and oriented x3 MSK- left great toe- swelling noted, erythema and TTP at MTP, decresaed ROM of toe, other toes FROM,no erythema no pain, no swelling or pain with squeeze of mid foot , weight bearing Pulse- DP- 2+        Assessment & Plan:      Problem List Items Addressed This Visit    None    Visit Diagnoses    Injury of great toe, left, initial encounter    -  Primary    Concern for fracture through mid joint, doubt actual cellutitus based on apperance and mechanism of injury, xray to be done    Relevant Orders    DG Toe Great Left       Note: This dictation was prepared with Dragon dictation along with smaller phrase technology. Any  transcriptional errors that result from this process are unintentional.

## 2014-05-16 ENCOUNTER — Ambulatory Visit (INDEPENDENT_AMBULATORY_CARE_PROVIDER_SITE_OTHER): Payer: Commercial Managed Care - HMO | Admitting: Cardiology

## 2014-05-16 ENCOUNTER — Encounter: Payer: Self-pay | Admitting: Cardiology

## 2014-05-16 VITALS — BP 140/80 | HR 64 | Ht <= 58 in | Wt 90.4 lb

## 2014-05-16 DIAGNOSIS — E785 Hyperlipidemia, unspecified: Secondary | ICD-10-CM

## 2014-05-16 DIAGNOSIS — R0789 Other chest pain: Secondary | ICD-10-CM

## 2014-05-16 DIAGNOSIS — I2510 Atherosclerotic heart disease of native coronary artery without angina pectoris: Secondary | ICD-10-CM | POA: Diagnosis not present

## 2014-05-16 DIAGNOSIS — I252 Old myocardial infarction: Secondary | ICD-10-CM

## 2014-05-16 NOTE — Progress Notes (Signed)
Unk Lightning Date of Birth: [birth year] Medical Record #[id number]  History of Present Illness:  [name] is seen for follow up CAD.  She has known CAD with anterior MI in August of 2013 treated with DES of the proximal LAD. EF  recovered.  Repeat cath 5/14 showed nonobstructive disease. Myoview March 2016 showed fixed septal defect without ischemia. EF 78%. Other issues include HLD, gastritis,  and GERD. She was admitted overnight 04/15/14 with chest pain. Ruled out for MI. Symptoms felt to be related to anxiety. On follow up today she is doing well. No chest pain. Had a recent fall and fractured her first great toe. In a boot now. Event monitor showed no significant arrhythmia.    Current Outpatient Prescriptions on File Prior to Visit  Medication Sig Dispense Refill  . aspirin EC 81 MG tablet Take 1 tablet (81 mg total) by mouth daily. 90 tablet 3  . donepezil (ARICEPT) 10 MG tablet Take 1 tablet (10 mg total) by mouth at bedtime. 30 tablet 3  . LORazepam (ATIVAN) 0.5 MG tablet Take 1 tablet (0.5 mg total) by mouth 2 (two) times daily as needed for anxiety (panic attack). 10 tablet 0  . pantoprazole (PROTONIX) 40 MG tablet Take 1 tablet (40 mg total) by mouth daily before breakfast. 90 tablet 3  . rosuvastatin (CRESTOR) 20 MG tablet Take 1 tablet (20 mg total) by mouth daily. 90 tablet 3  . traMADol (ULTRAM) 50 MG tablet Take 1 tablet (50 mg total) by mouth every 8 (eight) hours as needed. 30 tablet 0  . nitroGLYCERIN (NITROSTAT) 0.4 MG SL tablet Place 1 tablet (0.4 mg total) under the tongue every 5 (five) minutes x 3 doses as needed for chest pain. 30 tablet 3   No current facility-administered medications on file prior to visit.    Allergies  Allergen Reactions  . Ampicillin Itching  . Codeine Itching and Nausea And Vomiting  . Nubain [Nalbuphine Hcl] Other (See Comments)    "headache & disoriented"      Past Medical History  Diagnosis Date  . Ischemic cardiomyopathy     a.  08/27/2011 Echo: EF 35-40%, mid-dist anteroseptal/apical akinesis, Gr 1 DD, mild-mod TR, PASP 8mmHg  . Hypotension     a. preventing use of ACEI  . Diverticulosis   . Hyperlipidemia     takes Crestor nightly  . Asthmatic bronchitis     "as a child; haven't had any since ~ I was 26 yr old" (06/08/2012)  . Anemia     "I've always been anemic; never had a transfusion" (06/08/2012)  . H/O hiatal hernia   . Oral cancer 2003    a. s/p radiation/chemotherapy  . Emphysema of lung   . GERD (gastroesophageal reflux disease)     takes Protonix daily  . Constipation     takes Miralax daily prn  . CAD (coronary artery disease)     a. 08/26/2011 Acute Ant STEMI/Cath/PCI: LM nl, LAD 100p -> 2.75x23 Xience Xpedition, LCX 20, minor irregs, RCA 20-68mid, EF 30-35%, anterior/apical AK;  b. 05/2012 Cath: LM nl, LAD patent LAD stent, LCX 30ost, RCA dom, nl, EF 65%.  . Complication of anesthesia     slow to wake up  . Vertigo     occasionally  . Bruises easily     d/t taking Plavix and ASA  . History of colon polyps   . Dry mouth   . PONV (postoperative nausea and vomiting)   . STEMI (ST  elevation myocardial infarction) 08/2011    anterior/notes 09/07/2011 (06/08/2012)  . Gastritis   . Dementia     Past Surgical History  Procedure Laterality Date  . Bunionectomy Bilateral 1970's  . Shoulder open rotator cuff repair Left 1990's  . Tonsillectomy  ~ 1951  . Vaginal hysterectomy  ~ 1989    partial  . Tubal ligation  1975  . Coronary angioplasty with stent placement  08/2011    "1" (06/08/2012)  . Cardiac catheterization  06/09/2012    patent prox LAD stent, minor nonobstructive LAD disease, 30% ostial LCx, and otherwise widely patent cors; LVEF 65%  . Colonoscopy    . Peg placement      in 2003 and then removed  . Cholecystectomy  08/18/2012  . Cholecystectomy N/A 08/18/2012    Procedure: LAPAROSCOPIC CHOLECYSTECTOMY WITH INTRAOPERATIVE CHOLANGIOGRAM;  Surgeon: Imogene Burn. Georgette Dover, MD;  Location: [place];   Service: General;  Laterality: N/A;  . Left heart catheterization with coronary angiogram N/A 08/26/2011    Procedure: LEFT HEART CATHETERIZATION WITH CORONARY ANGIOGRAM;  Surgeon: Bernell Sigal M Martinique, MD;  Location: [facility] CATH LAB;  Service: Cardiovascular;  Laterality: N/A;  . Percutaneous coronary stent intervention (pci-s) N/A 08/26/2011    Procedure: PERCUTANEOUS CORONARY STENT INTERVENTION (PCI-S);  Surgeon: Helana Macbride M Martinique, MD;  Location: [facility] CATH LAB;  Service: Cardiovascular;  Laterality: N/A;  . Left heart catheterization with coronary angiogram N/A 06/09/2012    Procedure: LEFT HEART CATHETERIZATION WITH CORONARY ANGIOGRAM;  Surgeon: Sherren Mocha, MD;  Location: [facility] CATH LAB;  Service: Cardiovascular;  Laterality: N/A;    History  Smoking status  . Never Smoker   Smokeless tobacco  . Never Used    History  Alcohol Use No    Family History  Problem Relation Age of Onset  . Dementia Mother   . Heart disease Father   . Cancer Father     prostate  . Heart disease Paternal Grandfather   . Colon cancer Neg Hx     Review of Systems: The review of systems is per the HPI.  All other systems were reviewed and are negative.  Physical Exam: BP 140/80 mmHg  Pulse 64  Ht 4\' 8"  (1.422 m)  Wt 90 lb 6 oz (40.994 kg)  BMI 20.27 kg/m2 BP 110/70 supine, 110/80 sitting, 106/74 standing.  Patient is very pleasant and in no acute distress. Skin is warm and dry. Color is normal.  HEENT is unremarkable. Normocephalic/atraumatic. PERRL. Sclera are nonicteric. Neck is supple. No masses. No JVD. Lungs are clear. Cardiac exam shows a regular rate and rhythm. Abdomen is soft. Extremities are without edema. Gait and ROM are intact. No gross neurologic deficits noted.  LABORATORY DATA:  Lab Results  Component Value Date   WBC 7.3 04/14/2014   HGB 13.7 04/14/2014   HCT 42.1 04/14/2014   PLT 228 04/14/2014   GLUCOSE 123* 04/14/2014   CHOL 138 04/06/2014   TRIG 82 04/06/2014   HDL 64 04/06/2014    LDLCALC 58 04/06/2014   ALT 11 04/14/2014   AST 19 04/14/2014   NA 141 04/14/2014   K 3.5 04/14/2014   CL 107 04/14/2014   CREATININE 1.15* 04/14/2014   BUN 9 04/14/2014   CO2 28 04/14/2014   TSH 4.893* 08/07/2013   INR 1.03 06/08/2012   HGBA1C 5.9* 04/06/2014      Assessment / Plan: 1. CAD - s/p DES of LAD August 2013. Cath in May 2014 showed nonobstructive disease. Myoview in March was low  risk.  Continue ASA 81 mg.   2. Past anterior MI in August of 2013 treated with DES of the proximal LAD. EF has recovered and is normal.   3. HLD - excellent control on statin.  4. Near syncope. Normal event monitor.   5. Pan gastritis.  6. Dementia with some anxiety.

## 2014-05-16 NOTE — Patient Instructions (Signed)
Continue your current therapy  I will see you in 6 months.   

## 2014-05-17 ENCOUNTER — Other Ambulatory Visit: Payer: Self-pay | Admitting: Cardiology

## 2014-05-17 ENCOUNTER — Ambulatory Visit: Payer: Commercial Managed Care - HMO | Admitting: Cardiology

## 2014-06-26 ENCOUNTER — Ambulatory Visit
Admission: RE | Admit: 2014-06-26 | Discharge: 2014-06-26 | Disposition: A | Discharge status: Home / Self Care | Payer: Commercial Managed Care - HMO | Source: Ambulatory Visit | Attending: Family Medicine | Admitting: Family Medicine

## 2014-06-26 ENCOUNTER — Encounter: Payer: Self-pay | Admitting: Family Medicine

## 2014-06-26 ENCOUNTER — Ambulatory Visit (INDEPENDENT_AMBULATORY_CARE_PROVIDER_SITE_OTHER): Payer: Commercial Managed Care - HMO | Admitting: Family Medicine

## 2014-06-26 VITALS — BP 100/68 | HR 78 | Temp 98.3°F | Resp 16 | Ht <= 58 in | Wt 85.0 lb

## 2014-06-26 DIAGNOSIS — M79675 Pain in left toe(s): Secondary | ICD-10-CM

## 2014-06-26 DIAGNOSIS — F0390 Unspecified dementia without behavioral disturbance: Secondary | ICD-10-CM | POA: Diagnosis not present

## 2014-06-26 NOTE — Progress Notes (Signed)
Subjective:    Patient ID: Unk Lightning, female    DOB: [birth year], 69 y.o.   MRN: [id number]  HPI 04/26/14 Patient was admitted to the hospital March 27 with acute onset of shortness of breath and chest tightness while sleeping. She was kept in the hospital for observation as were cardiac enzymes were negative 3. Chest x-ray was normal. EKG was normal. Of note the patient also had a normal stress test March 2 of this year and a clear catheterization in 2014. Patient's symptoms sound like a panic attack. She has a difficult time putting into words exactly what it feels like her husband reports that she is having episodes once or twice a week where she will suddenly complained of shortness of breath and inability to breathe and anxiety. Of note I evaluated the patient for memory problems in November. At that time her Mini-Mental status exam was 25 out of 30. I repeated this today and today she was at best 20 out of 30. She seems noticeably more confused. She is unable to tell me the month. She is unable to tell me the day. She is unable to perform world which she could perform last time. She also has difficulty finding the names of objects.  At that time, my plan was: I believe the patient is using worsening of her dementia and memory loss. Therefore I recommend we begin Aricept 5 mg by mouth daily. In 2 weeks increase to 10 mg by mouth daily. Recheck in 1-2 months. She is tolerating the medication without complications, at that time I will add Namenda. I believe the worsening dementia is likely contributing to the anxiety and panic attacks. I did give her husband a prescription for Ativan 0.5 mg. Only gave her 10 tablets. He can give her 1 tablet every day as needed for severe panic attacks to prevent requiring them to go to the hospital. I cautioned him against using the medication because it could actually exacerbate confusion and memory problems.  06/26/14 Patient was seen by neurology who found  borderline low B12 and elevated methylmalonic acid and started her on B12 and discontinued aricept.   Patient has been on B12 for approximately 1 month. I perform any mental status exam today. Again the patient is unable to tell me what month it is. She is unable to tell me what year it is. She can tell me that today is Tuesday but she has no idea of the date. She is able to remember 2 out of 3 objects on recall which is slightly better. She is still unable to spell world in reverse or to perform serial sevens. She is also confused as to how she is when she has her birthday next week Past Medical History  Diagnosis Date  . Ischemic cardiomyopathy     a. 08/27/2011 Echo: EF 35-40%, mid-dist anteroseptal/apical akinesis, Gr 1 DD, mild-mod TR, PASP 29mmHg  . Hypotension     a. preventing use of ACEI  . Diverticulosis   . Hyperlipidemia     takes Crestor nightly  . Asthmatic bronchitis     "as a child; haven't had any since ~ I was 13 yr old" (06/08/2012)  . Anemia     "I've always been anemic; never had a transfusion" (06/08/2012)  . H/O hiatal hernia   . Oral cancer 2003    a. s/p radiation/chemotherapy  . Emphysema of lung   . GERD (gastroesophageal reflux disease)     takes Protonix daily  .  Constipation     takes Miralax daily prn  . CAD (coronary artery disease)     a. 08/26/2011 Acute Ant STEMI/Cath/PCI: LM nl, LAD 100p -> 2.75x23 Xience Xpedition, LCX 20, minor irregs, RCA 20-75mid, EF 30-35%, anterior/apical AK;  b. 05/2012 Cath: LM nl, LAD patent LAD stent, LCX 30ost, RCA dom, nl, EF 65%.  . Complication of anesthesia     slow to wake up  . Vertigo     occasionally  . Bruises easily     d/t taking Plavix and ASA  . History of colon polyps   . Dry mouth   . PONV (postoperative nausea and vomiting)   . STEMI (ST elevation myocardial infarction) 08/2011    anterior/notes 09/07/2011 (06/08/2012)  . Gastritis   . Dementia    Past Surgical History  Procedure Laterality Date  .  Bunionectomy Bilateral 1970's  . Shoulder open rotator cuff repair Left 1990's  . Tonsillectomy  ~ 1951  . Vaginal hysterectomy  ~ 1989    partial  . Tubal ligation  1975  . Coronary angioplasty with stent placement  08/2011    "1" (06/08/2012)  . Cardiac catheterization  06/09/2012    patent prox LAD stent, minor nonobstructive LAD disease, 30% ostial LCx, and otherwise widely patent cors; LVEF 65%  . Colonoscopy    . Peg placement      in 2003 and then removed  . Cholecystectomy  08/18/2012  . Cholecystectomy N/A 08/18/2012    Procedure: LAPAROSCOPIC CHOLECYSTECTOMY WITH INTRAOPERATIVE CHOLANGIOGRAM;  Surgeon: Imogene Burn. Georgette Dover, MD;  Location: [place];  Service: General;  Laterality: N/A;  . Left heart catheterization with coronary angiogram N/A 08/26/2011    Procedure: LEFT HEART CATHETERIZATION WITH CORONARY ANGIOGRAM;  Surgeon: Peter M Martinique, MD;  Location: [facility] CATH LAB;  Service: Cardiovascular;  Laterality: N/A;  . Percutaneous coronary stent intervention (pci-s) N/A 08/26/2011    Procedure: PERCUTANEOUS CORONARY STENT INTERVENTION (PCI-S);  Surgeon: Peter M Martinique, MD;  Location: [facility] CATH LAB;  Service: Cardiovascular;  Laterality: N/A;  . Left heart catheterization with coronary angiogram N/A 06/09/2012    Procedure: LEFT HEART CATHETERIZATION WITH CORONARY ANGIOGRAM;  Surgeon: Sherren Mocha, MD;  Location: [facility] CATH LAB;  Service: Cardiovascular;  Laterality: N/A;   Current Outpatient Prescriptions on File Prior to Visit  Medication Sig Dispense Refill  . aspirin EC 81 MG tablet Take 1 tablet (81 mg total) by mouth daily. 90 tablet 3  . CRESTOR 20 MG tablet TAKE 1 TABLET BY MOUTH AT BEDTIME 90 tablet 0  . donepezil (ARICEPT) 10 MG tablet Take 1 tablet (10 mg total) by mouth at bedtime. 30 tablet 3  . LORazepam (ATIVAN) 0.5 MG tablet Take 1 tablet (0.5 mg total) by mouth 2 (two) times daily as needed for anxiety (panic attack). 10 tablet 0  . nitroGLYCERIN (NITROSTAT) 0.4 MG SL tablet Place 1  tablet (0.4 mg total) under the tongue every 5 (five) minutes x 3 doses as needed for chest pain. 30 tablet 3  . pantoprazole (PROTONIX) 40 MG tablet Take 1 tablet (40 mg total) by mouth daily before breakfast. 90 tablet 3  . rosuvastatin (CRESTOR) 20 MG tablet Take 1 tablet (20 mg total) by mouth daily. 90 tablet 3  . traMADol (ULTRAM) 50 MG tablet Take 1 tablet (50 mg total) by mouth every 8 (eight) hours as needed. 30 tablet 0   No current facility-administered medications on file prior to visit.   Allergies  Allergen Reactions  . Ampicillin  Itching  . Codeine Itching and Nausea And Vomiting  . Nubain [Nalbuphine Hcl] Other (See Comments)    "headache & disoriented"     History   Social History  . Marital Status: Married    Spouse Name: N/A  . Number of Children: 4  . Years of Education: N/A   Occupational History  . Retired    Social History Main Topics  . Smoking status: Never Smoker   . Smokeless tobacco: Never Used  . Alcohol Use: No  . Drug Use: No  . Sexual Activity: No   Other Topics Concern  . Not on file   Social History Narrative      Review of Systems  All other systems reviewed and are negative.      Objective:   Physical Exam  Constitutional: She is oriented to person, place, and time.  Cardiovascular: Normal rate, regular rhythm and normal heart sounds.   No murmur heard. Pulmonary/Chest: Effort normal and breath sounds normal. No respiratory distress. She has no wheezes. She has no rales.  Abdominal: Soft. Bowel sounds are normal. She exhibits no distension. There is no tenderness. There is no rebound and no guarding.  Neurological: She is alert and oriented to person, place, and time. She has normal reflexes. No cranial nerve deficit. She exhibits normal muscle tone. Coordination normal.  Vitals reviewed.         Assessment & Plan:  Toe pain, left - Plan: DG Foot Complete Left  Dementia, without behavioral disturbance   [place]. She fractured her left great toe. She wore a postop shoe for 3 weeks. Shortly after discontinuing the shoes she developed pain and swelling in that toe. On exam today the toe does not appear deformed. There is no bruising. There is no erythema. The patient is tender to palpation around the MTP joint and the dorsum of the proximl phalanx. Therefore I will repeat an x-ray to evaluate for proper healing of the fracture. I believe the patient has dementia. I like to seethe patient back after she has completed a full 4 months of B12 replacement. If her memory is not improving at that time I will suggest that they resume Aricept

## 2014-08-10 ENCOUNTER — Other Ambulatory Visit: Payer: Self-pay | Admitting: Cardiology

## 2014-08-10 NOTE — Telephone Encounter (Signed)
REFILL 

## 2014-08-16 ENCOUNTER — Ambulatory Visit (INDEPENDENT_AMBULATORY_CARE_PROVIDER_SITE_OTHER): Payer: Commercial Managed Care - HMO | Admitting: Family Medicine

## 2014-08-16 ENCOUNTER — Encounter: Payer: Self-pay | Admitting: Family Medicine

## 2014-08-16 VITALS — BP 98/64 | HR 80 | Temp 98.5°F | Resp 16 | Ht <= 58 in | Wt 84.0 lb

## 2014-08-16 DIAGNOSIS — I2510 Atherosclerotic heart disease of native coronary artery without angina pectoris: Secondary | ICD-10-CM | POA: Diagnosis not present

## 2014-08-16 DIAGNOSIS — F0390 Unspecified dementia without behavioral disturbance: Secondary | ICD-10-CM | POA: Diagnosis not present

## 2014-08-16 DIAGNOSIS — E785 Hyperlipidemia, unspecified: Secondary | ICD-10-CM | POA: Diagnosis not present

## 2014-08-16 DIAGNOSIS — Z0000 Encounter for general adult medical examination without abnormal findings: Secondary | ICD-10-CM

## 2014-08-16 LAB — LIPID PANEL
Cholesterol: 126 mg/dL (ref 125–200)
HDL: 67 mg/dL (ref >=46)
LDL CALC: 45 mg/dL (ref <130)
Total CHOL/HDL Ratio: 1.9 Ratio (ref <=5.0)
Triglycerides: 72 mg/dL (ref <150)
VLDL: 14 mg/dL (ref <30)

## 2014-08-16 LAB — COMPLETE METABOLIC PANEL WITH GFR
ALT: 6 U/L (ref 6–29)
AST: 16 U/L (ref 10–35)
Albumin: 3.9 g/dL (ref 3.6–5.1)
Alkaline Phosphatase: 59 U/L (ref 33–130)
BILIRUBIN TOTAL: 0.7 mg/dL (ref 0.2–1.2)
BUN: 10 mg/dL (ref 7–25)
CO2: 23 mEq/L (ref 20–31)
Calcium: 9.6 mg/dL (ref 8.6–10.4)
Chloride: 107 mEq/L (ref 98–110)
Creat: 1.13 mg/dL — ABNORMAL HIGH (ref 0.50–0.99)
GFR, EST AFRICAN AMERICAN: 57 mL/min — AB (ref >=60)
GFR, Est Non African American: 50 mL/min — ABNORMAL LOW (ref >=60)
Glucose, Bld: 96 mg/dL (ref 70–99)
Potassium: 3.7 mEq/L (ref 3.5–5.3)
Sodium: 143 mEq/L (ref 135–146)
Total Protein: 6.3 g/dL (ref 6.1–8.1)

## 2014-08-16 LAB — CBC WITH DIFFERENTIAL/PLATELET
BASOS ABS: 0.1 10*3/uL (ref 0.0–0.1)
Basophils Relative: 1 % (ref 0–1)
Eosinophils Absolute: 0.1 10*3/uL (ref 0.0–0.7)
Eosinophils Relative: 2 % (ref 0–5)
HEMATOCRIT: 42.5 % (ref 36.0–46.0)
HEMOGLOBIN: 14.2 g/dL (ref 12.0–15.0)
LYMPHS PCT: 11 % — AB (ref 12–46)
Lymphs Abs: 0.7 10*3/uL (ref 0.7–4.0)
MCH: 29.3 pg (ref 26.0–34.0)
MCHC: 33.4 g/dL (ref 30.0–36.0)
MCV: 87.8 fL (ref 78.0–100.0)
MPV: 9.4 fL (ref 8.6–12.4)
Monocytes Absolute: 0.4 10*3/uL (ref 0.1–1.0)
Monocytes Relative: 7 % (ref 3–12)
NEUTROS ABS: 4.7 10*3/uL (ref 1.7–7.7)
Neutrophils Relative %: 79 % — ABNORMAL HIGH (ref 43–77)
Platelets: 239 10*3/uL (ref 150–400)
RBC: 4.84 MIL/uL (ref 3.87–5.11)
RDW: 14.1 % (ref 11.5–15.5)
WBC: 6 10*3/uL (ref 4.0–10.5)

## 2014-08-16 NOTE — Progress Notes (Signed)
Subjective:    Patient ID: Pamela Kidd, female    DOB: [birth year], 69 y.o.   MRN: [id number]  HPI Patient is a very pleasant 69 year old white female who is here today for complete physical exam. Patient's immunizations are up-to-date. She's had Pneumovax, Prevnar, and the shingles vaccine. She is overdue for a mammogram as well as a colonoscopy and a bone density test. Past medical history is significant for ischemic cardiomyopathy, coronary artery disease, hyperlipidemia, and dementia. She is seeing a neurologist who recommended replacement of vitamin B12. They discontinued her Aricept. She is now been on B12 for almost 3 months and there has been no improvement in her memory. Today on Mini-Mental status exam, she is unable to tell me the date or the year without assistance, she is able to spell world in reverse back and not perform serial sevens. She is unable to remember any of 3 words on rapid recall. I have suggested that she resume Aricept or try Namenda but I have also recommended they follow up with a neurologist for a second opinion. Past Medical History  Diagnosis Date  . Ischemic cardiomyopathy     a. 08/27/2011 Echo: EF 35-40%, mid-dist anteroseptal/apical akinesis, Gr 1 DD, mild-mod TR, PASP 58mmHg  . Hypotension     a. preventing use of ACEI  . Diverticulosis   . Hyperlipidemia     takes Crestor nightly  . Asthmatic bronchitis     "as a child; haven't had any since ~ I was 33 yr old" (06/08/2012)  . Anemia     "I've always been anemic; never had a transfusion" (06/08/2012)  . H/O hiatal hernia   . Oral cancer 2003    a. s/p radiation/chemotherapy  . Emphysema of lung   . GERD (gastroesophageal reflux disease)     takes Protonix daily  . Constipation     takes Miralax daily prn  . CAD (coronary artery disease)     a. 08/26/2011 Acute Ant STEMI/Cath/PCI: LM nl, LAD 100p -> 2.75x23 Xience Xpedition, LCX 20, minor irregs, RCA 20-41mid, EF 30-35%, anterior/apical AK;  b.  05/2012 Cath: LM nl, LAD patent LAD stent, LCX 30ost, RCA dom, nl, EF 65%.  . Complication of anesthesia     slow to wake up  . Vertigo     occasionally  . Bruises easily     d/t taking Plavix and ASA  . History of colon polyps   . Dry mouth   . PONV (postoperative nausea and vomiting)   . STEMI (ST elevation myocardial infarction) 08/2011    anterior/notes 09/07/2011 (06/08/2012)  . Gastritis   . Dementia    Past Surgical History  Procedure Laterality Date  . Bunionectomy Bilateral 1970's  . Shoulder open rotator cuff repair Left 1990's  . Tonsillectomy  ~ 1951  . Vaginal hysterectomy  ~ 1989    partial  . Tubal ligation  1975  . Coronary angioplasty with stent placement  08/2011    "1" (06/08/2012)  . Cardiac catheterization  06/09/2012    patent prox LAD stent, minor nonobstructive LAD disease, 30% ostial LCx, and otherwise widely patent cors; LVEF 65%  . Colonoscopy    . Peg placement      in 2003 and then removed  . Cholecystectomy  08/18/2012  . Cholecystectomy N/A 08/18/2012    Procedure: LAPAROSCOPIC CHOLECYSTECTOMY WITH INTRAOPERATIVE CHOLANGIOGRAM;  Surgeon: Imogene Burn. Georgette Dover, MD;  Location: [place];  Service: General;  Laterality: N/A;  . Left heart catheterization with coronary  angiogram N/A 08/26/2011    Procedure: LEFT HEART CATHETERIZATION WITH CORONARY ANGIOGRAM;  Surgeon: Peter M Martinique, MD;  Location: [facility] CATH LAB;  Service: Cardiovascular;  Laterality: N/A;  . Percutaneous coronary stent intervention (pci-s) N/A 08/26/2011    Procedure: PERCUTANEOUS CORONARY STENT INTERVENTION (PCI-S);  Surgeon: Peter M Martinique, MD;  Location: [facility] CATH LAB;  Service: Cardiovascular;  Laterality: N/A;  . Left heart catheterization with coronary angiogram N/A 06/09/2012    Procedure: LEFT HEART CATHETERIZATION WITH CORONARY ANGIOGRAM;  Surgeon: Sherren Mocha, MD;  Location: [facility] CATH LAB;  Service: Cardiovascular;  Laterality: N/A;   Current Outpatient Prescriptions on File Prior to Visit    Medication Sig Dispense Refill  . aspirin EC 81 MG tablet Take 1 tablet (81 mg total) by mouth daily. 90 tablet 3  . LORazepam (ATIVAN) 0.5 MG tablet Take 1 tablet (0.5 mg total) by mouth 2 (two) times daily as needed for anxiety (panic attack). 10 tablet 0  . nitroGLYCERIN (NITROSTAT) 0.4 MG SL tablet Place 1 tablet (0.4 mg total) under the tongue every 5 (five) minutes x 3 doses as needed for chest pain. 30 tablet 3  . pantoprazole (PROTONIX) 40 MG tablet Take 1 tablet (40 mg total) by mouth daily before breakfast. 90 tablet 3  . rosuvastatin (CRESTOR) 20 MG tablet Take 1 tablet (20 mg total) by mouth at bedtime. NEED OV. 90 tablet 0  . traMADol (ULTRAM) 50 MG tablet Take 1 tablet (50 mg total) by mouth every 8 (eight) hours as needed. 30 tablet 0   No current facility-administered medications on file prior to visit.   Allergies  Allergen Reactions  . Ampicillin Itching  . Codeine Itching and Nausea And Vomiting  . Nubain [Nalbuphine Hcl] Other (See Comments)    "headache & disoriented"     History   Social History  . Marital Status: Married    Spouse Name: N/A  . Number of Children: 4  . Years of Education: N/A   Occupational History  . Retired    Social History Main Topics  . Smoking status: Never Smoker   . Smokeless tobacco: Never Used  . Alcohol Use: No  . Drug Use: No  . Sexual Activity: No   Other Topics Concern  . Not on file   Social History Narrative   Family History  Problem Relation Age of Onset  . Dementia Mother   . Heart disease Father   . Cancer Father     prostate  . Heart disease Paternal Grandfather   . Colon cancer Neg Hx       Review of Systems  All other systems reviewed and are negative.      Objective:   Physical Exam  Constitutional: She is oriented to person, place, and time. She appears well-developed and well-nourished. No distress.  HENT:  Head: Normocephalic and atraumatic.  Right Ear: External ear normal.  Left Ear:  External ear normal.  Nose: Nose normal.  Mouth/Throat: Oropharynx is clear and moist. No oropharyngeal exudate.  Eyes: Conjunctivae and EOM are normal. Pupils are equal, round, and reactive to light. Right eye exhibits no discharge. Left eye exhibits no discharge. No scleral icterus.  Neck: Normal range of motion. Neck supple. No JVD present. No thyromegaly present.  Cardiovascular: Normal rate, regular rhythm, normal heart sounds and intact distal pulses.  Exam reveals no gallop and no friction rub.   No murmur heard. Pulmonary/Chest: Effort normal and breath sounds normal. No respiratory distress. She has no wheezes.  She has no rales. She exhibits no tenderness.  Abdominal: Soft. Bowel sounds are normal. She exhibits no distension and no mass. There is no tenderness. There is no rebound and no guarding.  Musculoskeletal: Normal range of motion. She exhibits no edema or tenderness.  Lymphadenopathy:    She has no cervical adenopathy.  Neurological: She is alert and oriented to person, place, and time. She has normal reflexes. She displays normal reflexes. No cranial nerve deficit. She exhibits normal muscle tone. Coordination normal.  Skin: Skin is warm. No rash noted. She is not diaphoretic. No erythema. No pallor.  Psychiatric: She has a normal mood and affect. Her behavior is normal.  Vitals reviewed.         Assessment & Plan:  ASCVD (arteriosclerotic cardiovascular disease) - Plan: CBC with Differential/Platelet, COMPLETE METABOLIC PANEL WITH GFR, Lipid panel  Routine general medical examination at a health care facility  Dementia, without behavioral disturbance  HLD (hyperlipidemia)  I will schedule the patient for a bone density test, mammogram, and a colonoscopy. I will check a CBC, CMP, fasting lipid panel. Immunizations are up-to-date. I have recommended she follow back with a neurologist but I suggested that she try either to resume the Aricept or try Namenda

## 2014-08-17 ENCOUNTER — Encounter: Payer: Self-pay | Admitting: *Deleted

## 2014-08-24 ENCOUNTER — Telehealth: Payer: Self-pay | Admitting: Family Medicine

## 2014-08-24 NOTE — Telephone Encounter (Signed)
Husband calling.  Picked pt up the other day. [facility])  Now is having a lot pain on side of chest/under rt breast area  Hurts to move, hurts take deep breath.  No visible bruise. Can raise arms over her head.  What can she do about?  Sounds like bruise ribs.  Continue to watch, take it easy.  Try some warm compresses to sight over weekend.  If still painful by Monday, call and schedule appt.

## 2014-08-27 ENCOUNTER — Ambulatory Visit: Payer: Commercial Managed Care - HMO | Admitting: Family Medicine

## 2014-08-31 ENCOUNTER — Telehealth: Payer: Self-pay | Admitting: Family Medicine

## 2014-08-31 NOTE — Telephone Encounter (Signed)
Need new [employer] referral for addition visits with Neuro.  Referral approved thru [facility] # [id number] for 6 visits 08/31/14 - 02/27/15  Auth faxed to [facility]

## 2014-11-09 ENCOUNTER — Ambulatory Visit (INDEPENDENT_AMBULATORY_CARE_PROVIDER_SITE_OTHER): Payer: Commercial Managed Care - HMO | Admitting: Family Medicine

## 2014-11-09 ENCOUNTER — Encounter: Payer: Self-pay | Admitting: Family Medicine

## 2014-11-09 VITALS — BP 90/60 | HR 90 | Temp 98.6°F | Resp 16 | Wt 73.5 lb

## 2014-11-09 DIAGNOSIS — Z23 Encounter for immunization: Secondary | ICD-10-CM | POA: Diagnosis not present

## 2014-11-09 DIAGNOSIS — R634 Abnormal weight loss: Secondary | ICD-10-CM

## 2014-11-09 DIAGNOSIS — R1310 Dysphagia, unspecified: Secondary | ICD-10-CM | POA: Diagnosis not present

## 2014-11-09 DIAGNOSIS — R6881 Early satiety: Secondary | ICD-10-CM

## 2014-11-12 ENCOUNTER — Encounter: Payer: Self-pay | Admitting: Family Medicine

## 2014-11-12 NOTE — Progress Notes (Signed)
Subjective:    Patient ID: Pamela Kidd, female    DOB: 1945/11/09, 69 y.o.   MRN: [id number]  HPI Patient has a history of oral cancer. Over the last several months, she has developed a burning sensation in her upper oropharynx whenever she swallows. She since his burning pain just above the level of the cricoid cartilage. She also complains of dysphagia with food sticking, pills sticking, and trouble swallowing. She has lost over 10 pounds since her last office visit due to her inability to swallow, early satiety, and pain in the throat. Wt Readings from Last 3 Encounters:  11/09/14 73 lb 8 oz (33.339 kg)  08/16/14 84 lb (38.102 kg)  06/26/14 85 lb (38.556 kg)   Past Medical History  Diagnosis Date  . Ischemic cardiomyopathy     a. 08/27/2011 Echo: EF 35-40%, mid-dist anteroseptal/apical akinesis, Gr 1 DD, mild-mod TR, PASP 67mmHg  . Hypotension     a. preventing use of ACEI  . Diverticulosis   . Hyperlipidemia     takes Crestor nightly  . Asthmatic bronchitis     "as a child; haven't had any since ~ I was 29 yr old" (06/08/2012)  . Anemia     "I've always been anemic; never had a transfusion" (06/08/2012)  . H/O hiatal hernia   . Oral cancer ([place]) 2003    a. s/p radiation/chemotherapy  . Emphysema of lung ([place])   . GERD (gastroesophageal reflux disease)     takes Protonix daily  . Constipation     takes Miralax daily prn  . CAD (coronary artery disease)     a. 08/26/2011 Acute Ant STEMI/Cath/PCI: LM nl, LAD 100p -> 2.75x23 Xience Xpedition, LCX 20, minor irregs, RCA 20-64mid, EF 30-35%, anterior/apical AK;  b. 05/2012 Cath: LM nl, LAD patent LAD stent, LCX 30ost, RCA dom, nl, EF 65%.  . Complication of anesthesia     slow to wake up  . Vertigo     occasionally  . Bruises easily     d/t taking Plavix and ASA  . History of colon polyps   . Dry mouth   . PONV (postoperative nausea and vomiting)   . STEMI (ST elevation myocardial infarction) ([place]) 08/2011   anterior/notes 09/07/2011 (06/08/2012)  . Gastritis   . Dementia    Past Surgical History  Procedure Laterality Date  . Bunionectomy Bilateral 1970's  . Shoulder open rotator cuff repair Left 1990's  . Tonsillectomy  ~ 1951  . Vaginal hysterectomy  ~ 1989    partial  . Tubal ligation  1975  . Coronary angioplasty with stent placement  08/2011    "1" (06/08/2012)  . Cardiac catheterization  06/09/2012    patent prox LAD stent, minor nonobstructive LAD disease, 30% ostial LCx, and otherwise widely patent cors; LVEF 65%  . Colonoscopy    . Peg placement      in 2003 and then removed  . Cholecystectomy  08/18/2012  . Cholecystectomy N/A 08/18/2012    Procedure: LAPAROSCOPIC CHOLECYSTECTOMY WITH INTRAOPERATIVE CHOLANGIOGRAM;  Surgeon: Imogene Burn. Georgette Dover, MD;  Location: [place];  Service: General;  Laterality: N/A;  . Left heart catheterization with coronary angiogram N/A 08/26/2011    Procedure: LEFT HEART CATHETERIZATION WITH CORONARY ANGIOGRAM;  Surgeon: Peter M Martinique, MD;  Location: [facility] CATH LAB;  Service: Cardiovascular;  Laterality: N/A;  . Percutaneous coronary stent intervention (pci-s) N/A 08/26/2011    Procedure: PERCUTANEOUS CORONARY STENT INTERVENTION (PCI-S);  Surgeon: Peter M Martinique, MD;  Location: [facility]  CATH LAB;  Service: Cardiovascular;  Laterality: N/A;  . Left heart catheterization with coronary angiogram N/A 06/09/2012    Procedure: LEFT HEART CATHETERIZATION WITH CORONARY ANGIOGRAM;  Surgeon: Sherren Mocha, MD;  Location: [facility] CATH LAB;  Service: Cardiovascular;  Laterality: N/A;   Current Outpatient Prescriptions on File Prior to Visit  Medication Sig Dispense Refill  . aspirin EC 81 MG tablet Take 1 tablet (81 mg total) by mouth daily. 90 tablet 3  . folic acid (FOLVITE) 1 MG tablet Take 1 mg by mouth daily.    Marland Kitchen LORazepam (ATIVAN) 0.5 MG tablet Take 1 tablet (0.5 mg total) by mouth 2 (two) times daily as needed for anxiety (panic attack). 10 tablet 0  . pantoprazole (PROTONIX) 40 MG  tablet Take 1 tablet (40 mg total) by mouth daily before breakfast. 90 tablet 3  . rosuvastatin (CRESTOR) 20 MG tablet Take 1 tablet (20 mg total) by mouth at bedtime. NEED OV. 90 tablet 0  . traMADol (ULTRAM) 50 MG tablet Take 1 tablet (50 mg total) by mouth every 8 (eight) hours as needed. 30 tablet 0  . vitamin B-12 (CYANOCOBALAMIN) 1000 MCG tablet Take 1,000 mcg by mouth daily.    . nitroGLYCERIN (NITROSTAT) 0.4 MG SL tablet Place 1 tablet (0.4 mg total) under the tongue every 5 (five) minutes x 3 doses as needed for chest pain. 30 tablet 3   No current facility-administered medications on file prior to visit.   Allergies  Allergen Reactions  . Ampicillin Itching  . Codeine Itching and Nausea And Vomiting  . Nubain [Nalbuphine Hcl] Other (See Comments)    "headache & disoriented"     Social History   Social History  . Marital Status: Married    Spouse Name: N/A  . Number of Children: 4  . Years of Education: N/A   Occupational History  . Retired    Social History Main Topics  . Smoking status: Never Smoker   . Smokeless tobacco: Never Used  . Alcohol Use: No  . Drug Use: No  . Sexual Activity: No   Other Topics Concern  . Not on file   Social History Narrative      Review of Systems  All other systems reviewed and are negative.      Objective:   Physical Exam  Constitutional: She appears well-developed and well-nourished. No distress.  HENT:  Right Ear: External ear normal.  Left Ear: External ear normal.  Nose: Nose normal.  Mouth/Throat: No oropharyngeal exudate.  Eyes: Conjunctivae are normal.  Neck: Neck supple. No thyromegaly present.  Cardiovascular: Normal rate, regular rhythm and normal heart sounds.   Pulmonary/Chest: Effort normal and breath sounds normal. No stridor. No respiratory distress. She has no wheezes. She has no rales.  Lymphadenopathy:    She has no cervical adenopathy.  Skin: She is not diaphoretic.  Vitals reviewed.          Assessment & Plan:  Need for immunization against influenza - Plan: Flu Vaccine QUAD 36+ mos PF IM (Fluarix & Fluzone Quad PF)  Dysphagia - Plan: Ambulatory referral to ENT  Loss of weight - Plan: Ambulatory referral to ENT  Early satiety - Plan: Ambulatory referral to ENT  Patient had an EGD performed in February of this year that was completely normal. However given the location of  Of the burning pain in her upper throat along with her history of oral cancer, I believe the patient needs direct visual laryngoscopy through ENT. I will schedule  her to see her ear nose and throat physician, Dr. Owens Shark, at [facility] as soon as possible. I recommended the patient drank one can of boost or injury were 2-3 times a day to try to stem the weight loss. She recently had a fainting spell due to low blood pressure and I believe protein calorie malnutrition.

## 2014-11-15 ENCOUNTER — Telehealth: Payer: Self-pay | Admitting: *Deleted

## 2014-11-15 NOTE — Telephone Encounter (Signed)
Submitted humana referral thru acuity connect for authorization on 11/15/14 to Dr. Mercie Eon with authorization [phone]  Requesting provider: Flonnie Hailstone  Treating provider: Mercie Eon  Number of visits:6  Start Date:11/20/14  End Date:05/19/15  Dx: Q22.29-[id number]       R63.4- abnormal weight loss       R61.81- Early Satiety

## 2014-11-16 ENCOUNTER — Ambulatory Visit: Payer: Commercial Managed Care - HMO | Admitting: Family Medicine

## 2014-11-20 DIAGNOSIS — R1312 Dysphagia, oropharyngeal phase: Secondary | ICD-10-CM | POA: Insufficient documentation

## 2014-11-29 ENCOUNTER — Ambulatory Visit (INDEPENDENT_AMBULATORY_CARE_PROVIDER_SITE_OTHER): Payer: Commercial Managed Care - HMO | Admitting: Family Medicine

## 2014-11-29 ENCOUNTER — Encounter: Payer: Self-pay | Admitting: Family Medicine

## 2014-11-29 VITALS — BP 104/72 | HR 92 | Temp 98.5°F | Resp 16 | Ht <= 58 in | Wt 74.0 lb

## 2014-11-29 DIAGNOSIS — R634 Abnormal weight loss: Secondary | ICD-10-CM

## 2014-11-29 MED ORDER — MIRTAZAPINE 30 MG PO TABS: 30.0000 mg | ORAL_TABLET | Freq: Every day | ORAL | Status: DC

## 2014-11-29 MED ORDER — MIRTAZAPINE 30 MG PO TABS
30.0000 mg | ORAL_TABLET | Freq: Every day | ORAL | Status: DC
Start: 2014-11-29 — End: 2015-06-20

## 2014-11-29 NOTE — Progress Notes (Signed)
Subjective:    Patient ID: Pamela Kidd, female    DOB: 1945/05/29, 69 y.o.   MRN: [id number]  HPI 11/09/14 Patient has a history of oral cancer. Over the last several months, she has developed a burning sensation in her upper oropharynx whenever she swallows. She since his burning pain just above the level of the cricoid cartilage. She also complains of dysphagia with food sticking, pills sticking, and trouble swallowing. She has lost over 10 pounds since her last office visit due to her inability to swallow, early satiety, and pain in the throat. Wt Readings from Last 3 Encounters:  11/09/14 73 lb 8 oz (33.339 kg)  08/16/14 84 lb (38.102 kg)  06/26/14 85 lb (38.556 kg)  At that time, my plan was: Patient had an EGD performed in February of this year that was completely normal. However given the location of  Of the burning pain in her upper throat along with her history of oral cancer, I believe the patient needs direct visual laryngoscopy through ENT. I will schedule her to see her ear nose and throat physician, Dr. Owens Shark, at [facility] as soon as possible. I recommended the patient drank one can of boost or injury were 2-3 times a day to try to stem the weight loss. She recently had a fainting spell due to low blood pressure and I believe protein calorie malnutrition.  11/29/14 Reportedly, laryngoscopy was performed at [facility] and no abnormalities were seen. The patient has only gained 8 ounces since her last office visit and she is wearing a heavy sweater. Her husband states that she will sleep until 10:30 every morning. She skips breakfast. She only eats a few bites for lunch and dinner. She will chew her food for 15 minutes at a time and then will spit it out. She denies any eating disorder however some of her behavior is certainly concerning for that. She denies ongoing depression or anhedonia or suicidal ideation. Past Medical History  Diagnosis Date  . Ischemic  cardiomyopathy     a. 08/27/2011 Echo: EF 35-40%, mid-dist anteroseptal/apical akinesis, Gr 1 DD, mild-mod TR, PASP 28mmHg  . Hypotension     a. preventing use of ACEI  . Diverticulosis   . Hyperlipidemia     takes Crestor nightly  . Asthmatic bronchitis     "as a child; haven't had any since ~ I was 48 yr old" (06/08/2012)  . Anemia     "I've always been anemic; never had a transfusion" (06/08/2012)  . H/O hiatal hernia   . Oral cancer ([place]) 2003    a. s/p radiation/chemotherapy  . Emphysema of lung ([place])   . GERD (gastroesophageal reflux disease)     takes Protonix daily  . Constipation     takes Miralax daily prn  . CAD (coronary artery disease)     a. 08/26/2011 Acute Ant STEMI/Cath/PCI: LM nl, LAD 100p -> 2.75x23 Xience Xpedition, LCX 20, minor irregs, RCA 20-80mid, EF 30-35%, anterior/apical AK;  b. 05/2012 Cath: LM nl, LAD patent LAD stent, LCX 30ost, RCA dom, nl, EF 65%.  . Complication of anesthesia     slow to wake up  . Vertigo     occasionally  . Bruises easily     d/t taking Plavix and ASA  . History of colon polyps   . Dry mouth   . PONV (postoperative nausea and vomiting)   . STEMI (ST elevation myocardial infarction) ([place]) 08/2011    anterior/notes 09/07/2011 (06/08/2012)  .  Gastritis   . Dementia    Past Surgical History  Procedure Laterality Date  . Bunionectomy Bilateral 1970's  . Shoulder open rotator cuff repair Left 1990's  . Tonsillectomy  ~ 1951  . Vaginal hysterectomy  ~ 1989    partial  . Tubal ligation  1975  . Coronary angioplasty with stent placement  08/2011    "1" (06/08/2012)  . Cardiac catheterization  06/09/2012    patent prox LAD stent, minor nonobstructive LAD disease, 30% ostial LCx, and otherwise widely patent cors; LVEF 65%  . Colonoscopy    . Peg placement      in 2003 and then removed  . Cholecystectomy  08/18/2012  . Cholecystectomy N/A 08/18/2012    Procedure: LAPAROSCOPIC CHOLECYSTECTOMY WITH INTRAOPERATIVE CHOLANGIOGRAM;  Surgeon:  Imogene Burn. Georgette Dover, MD;  Location: [place];  Service: General;  Laterality: N/A;  . Left heart catheterization with coronary angiogram N/A 08/26/2011    Procedure: LEFT HEART CATHETERIZATION WITH CORONARY ANGIOGRAM;  Surgeon: Peter M Martinique, MD;  Location: [facility] CATH LAB;  Service: Cardiovascular;  Laterality: N/A;  . Percutaneous coronary stent intervention (pci-s) N/A 08/26/2011    Procedure: PERCUTANEOUS CORONARY STENT INTERVENTION (PCI-S);  Surgeon: Peter M Martinique, MD;  Location: [facility] CATH LAB;  Service: Cardiovascular;  Laterality: N/A;  . Left heart catheterization with coronary angiogram N/A 06/09/2012    Procedure: LEFT HEART CATHETERIZATION WITH CORONARY ANGIOGRAM;  Surgeon: Sherren Mocha, MD;  Location: [facility] CATH LAB;  Service: Cardiovascular;  Laterality: N/A;   Current Outpatient Prescriptions on File Prior to Visit  Medication Sig Dispense Refill  . aspirin EC 81 MG tablet Take 1 tablet (81 mg total) by mouth daily. 90 tablet 3  . folic acid (FOLVITE) 1 MG tablet Take 1 mg by mouth daily.    Marland Kitchen LORazepam (ATIVAN) 0.5 MG tablet Take 1 tablet (0.5 mg total) by mouth 2 (two) times daily as needed for anxiety (panic attack). 10 tablet 0  . nitroGLYCERIN (NITROSTAT) 0.4 MG SL tablet Place 1 tablet (0.4 mg total) under the tongue every 5 (five) minutes x 3 doses as needed for chest pain. 30 tablet 3  . pantoprazole (PROTONIX) 40 MG tablet Take 1 tablet (40 mg total) by mouth daily before breakfast. 90 tablet 3  . rosuvastatin (CRESTOR) 20 MG tablet Take 1 tablet (20 mg total) by mouth at bedtime. NEED OV. 90 tablet 0  . traMADol (ULTRAM) 50 MG tablet Take 1 tablet (50 mg total) by mouth every 8 (eight) hours as needed. 30 tablet 0  . vitamin B-12 (CYANOCOBALAMIN) 1000 MCG tablet Take 1,000 mcg by mouth daily.     No current facility-administered medications on file prior to visit.   Allergies  Allergen Reactions  . Ampicillin Itching  . Codeine Itching and Nausea And Vomiting  . Nubain [Nalbuphine  Hcl] Other (See Comments)    "headache & disoriented"     Social History   Social History  . Marital Status: Married    Spouse Name: N/A  . Number of Children: 4  . Years of Education: N/A   Occupational History  . Retired    Social History Main Topics  . Smoking status: Never Smoker   . Smokeless tobacco: Never Used  . Alcohol Use: No  . Drug Use: No  . Sexual Activity: No   Other Topics Concern  . Not on file   Social History Narrative      Review of Systems  All other systems reviewed and are negative.  Objective:   Physical Exam  Constitutional: She appears well-developed and well-nourished. No distress.  HENT:  Right Ear: External ear normal.  Left Ear: External ear normal.  Nose: Nose normal.  Mouth/Throat: No oropharyngeal exudate.  Eyes: Conjunctivae are normal.  Neck: Neck supple. No thyromegaly present.  Cardiovascular: Normal rate, regular rhythm and normal heart sounds.   Pulmonary/Chest: Effort normal and breath sounds normal. No stridor. No respiratory distress. She has no wheezes. She has no rales.  Lymphadenopathy:    She has no cervical adenopathy.  Skin: She is not diaphoretic.  Vitals reviewed.         Assessment & Plan:  Loss of weight - Plan: mirtazapine (REMERON) 30 MG tablet  I am seeing the patient at 3:00 in the afternoon she has still not eaten yet today. I am concerned about possible underlying eating disorder. I'll start the patient on Remeron 30 mg by mouth daily at bedtime and recheck the patient in one month. My ultimate goal would be to have the patient weighed between 85 and 90 pounds at a minimum.

## 2014-11-29 NOTE — Addendum Note (Signed)
Addended by: Shary Decamp B on: 11/29/2014 04:41 PM   Modules accepted: Orders

## 2014-12-11 ENCOUNTER — Other Ambulatory Visit: Payer: Self-pay | Admitting: Cardiology

## 2014-12-11 ENCOUNTER — Other Ambulatory Visit: Payer: Self-pay

## 2014-12-11 MED ORDER — ROSUVASTATIN CALCIUM 20 MG PO TABS: 20.0000 mg | ORAL_TABLET | Freq: Every day | ORAL | Status: DC

## 2014-12-11 NOTE — Telephone Encounter (Signed)
Pamela M Martinique, MD at 05/16/2014 12:24 PM  rosuvastatin (CRESTOR) 20 MG tabletTake 1 tablet (20 mg total) by mouth daily 3. HLD - excellent control on statin Patient Instructions     Continue your current therapy  I will see you in 6 months.

## 2014-12-27 ENCOUNTER — Telehealth: Payer: Self-pay | Admitting: *Deleted

## 2014-12-27 NOTE — Telephone Encounter (Signed)
Submitted humana referral thru acuity connect for authorization on 12/24/14 to Dr. Fenton Malling, MD with authorization number [id number]  Requesting provider: Flonnie Hailstone  Treating provider: Durel Salts  Number of visits:6  Start Date: 12/24/14  End Date:06/22/15  Dx: R13.12-Dyphagia, oropharyngeal phase

## 2014-12-31 ENCOUNTER — Encounter: Payer: Self-pay | Admitting: Family Medicine

## 2014-12-31 ENCOUNTER — Ambulatory Visit (INDEPENDENT_AMBULATORY_CARE_PROVIDER_SITE_OTHER): Payer: Commercial Managed Care - HMO | Admitting: Family Medicine

## 2014-12-31 VITALS — BP 98/64 | HR 86 | Temp 98.6°F | Resp 16 | Ht <= 58 in | Wt 76.0 lb

## 2014-12-31 DIAGNOSIS — R634 Abnormal weight loss: Secondary | ICD-10-CM

## 2014-12-31 NOTE — Progress Notes (Signed)
Subjective:    Patient ID: Pamela Kidd, female    DOB: 01-30-1945, 69 y.o.   MRN: [id number]  HPI 11/09/14 Patient has a history of oral cancer. Over the last several months, she has developed a burning sensation in her upper oropharynx whenever she swallows. She since his burning pain just above the level of the cricoid cartilage. She also complains of dysphagia with food sticking, pills sticking, and trouble swallowing. She has lost over 10 pounds since her last office visit due to her inability to swallow, early satiety, and pain in the throat. Wt Readings from Last 3 Encounters:  12/31/14 76 lb (34.473 kg)  11/29/14 74 lb (33.566 kg)  11/09/14 73 lb 8 oz (33.339 kg)  At that time, my plan was: Patient had an EGD performed in February of this year that was completely normal. However given the location of  Of the burning pain in her upper throat along with her history of oral cancer, I believe the patient needs direct visual laryngoscopy through ENT. I will schedule her to see her ear nose and throat physician, Dr. Owens Shark, at [facility] as soon as possible. I recommended the patient drank one can of boost or injury were 2-3 times a day to try to stem the weight loss. She recently had a fainting spell due to low blood pressure and I believe protein calorie malnutrition.  11/29/14 Reportedly, laryngoscopy was performed at [facility] and no abnormalities were seen. The patient has only gained 8 ounces since her last office visit and she is wearing a heavy sweater. Her husband states that she will sleep until 10:30 every morning. She skips breakfast. She only eats a few bites for lunch and dinner. She will chew her food for 15 minutes at a time and then will spit it out. She denies any eating disorder however some of her behavior is certainly concerning for that. She denies ongoing depression or anhedonia or suicidal ideation.  At that time, my plan was: I am seeing the patient at 3:00  in the afternoon she has still not eaten yet today. I am concerned about possible underlying eating disorder. I'll start the patient on Remeron 30 mg by mouth daily at bedtime and recheck the patient in one month. My ultimate goal would be to have the patient weighed between 85 and 90 pounds at a minimum.  12/31/14 Wt Readings from Last 3 Encounters:  12/31/14 76 lb (34.473 kg)  11/29/14 74 lb (33.566 kg)  11/09/14 73 lb 8 oz (33.339 kg)    thankfully the patient has gained 2 pounds since her last office visit. Patient's husband states that she is eating better and that her appetite has improved. She seems to be tolerating the Remeron without difficulty. She would like to continue the medication a little bit longer prior to trying Megace Past Medical History  Diagnosis Date  . Ischemic cardiomyopathy     a. 08/27/2011 Echo: EF 35-40%, mid-dist anteroseptal/apical akinesis, Gr 1 DD, mild-mod TR, PASP 77mmHg  . Hypotension     a. preventing use of ACEI  . Diverticulosis   . Hyperlipidemia     takes Crestor nightly  . Asthmatic bronchitis     "as a child; haven't had any since ~ I was 75 yr old" (06/08/2012)  . Anemia     "I've always been anemic; never had a transfusion" (06/08/2012)  . H/O hiatal hernia   . Oral cancer ([place]) 2003    a. s/p radiation/chemotherapy  .  Emphysema of lung ([place])   . GERD (gastroesophageal reflux disease)     takes Protonix daily  . Constipation     takes Miralax daily prn  . CAD (coronary artery disease)     a. 08/26/2011 Acute Ant STEMI/Cath/PCI: LM nl, LAD 100p -> 2.75x23 Xience Xpedition, LCX 20, minor irregs, RCA 20-18mid, EF 30-35%, anterior/apical AK;  b. 05/2012 Cath: LM nl, LAD patent LAD stent, LCX 30ost, RCA dom, nl, EF 65%.  . Complication of anesthesia     slow to wake up  . Vertigo     occasionally  . Bruises easily     d/t taking Plavix and ASA  . History of colon polyps   . Dry mouth   . PONV (postoperative nausea and vomiting)   . STEMI (ST  elevation myocardial infarction) ([place]) 08/2011    anterior/notes 09/07/2011 (06/08/2012)  . Gastritis   . Dementia    Past Surgical History  Procedure Laterality Date  . Bunionectomy Bilateral 1970's  . Shoulder open rotator cuff repair Left 1990's  . Tonsillectomy  ~ 1951  . Vaginal hysterectomy  ~ 1989    partial  . Tubal ligation  1975  . Coronary angioplasty with stent placement  08/2011    "1" (06/08/2012)  . Cardiac catheterization  06/09/2012    patent prox LAD stent, minor nonobstructive LAD disease, 30% ostial LCx, and otherwise widely patent cors; LVEF 65%  . Colonoscopy    . Peg placement      in 2003 and then removed  . Cholecystectomy  08/18/2012  . Cholecystectomy N/A 08/18/2012    Procedure: LAPAROSCOPIC CHOLECYSTECTOMY WITH INTRAOPERATIVE CHOLANGIOGRAM;  Surgeon: Imogene Burn. Georgette Dover, MD;  Location: [place];  Service: General;  Laterality: N/A;  . Left heart catheterization with coronary angiogram N/A 08/26/2011    Procedure: LEFT HEART CATHETERIZATION WITH CORONARY ANGIOGRAM;  Surgeon: Peter M Martinique, MD;  Location: [facility] CATH LAB;  Service: Cardiovascular;  Laterality: N/A;  . Percutaneous coronary stent intervention (pci-s) N/A 08/26/2011    Procedure: PERCUTANEOUS CORONARY STENT INTERVENTION (PCI-S);  Surgeon: Peter M Martinique, MD;  Location: [facility] CATH LAB;  Service: Cardiovascular;  Laterality: N/A;  . Left heart catheterization with coronary angiogram N/A 06/09/2012    Procedure: LEFT HEART CATHETERIZATION WITH CORONARY ANGIOGRAM;  Surgeon: Sherren Mocha, MD;  Location: [facility] CATH LAB;  Service: Cardiovascular;  Laterality: N/A;   Current Outpatient Prescriptions on File Prior to Visit  Medication Sig Dispense Refill  . aspirin EC 81 MG tablet Take 1 tablet (81 mg total) by mouth daily. 90 tablet 3  . folic acid (FOLVITE) 1 MG tablet Take 1 mg by mouth daily.    Marland Kitchen LORazepam (ATIVAN) 0.5 MG tablet Take 1 tablet (0.5 mg total) by mouth 2 (two) times daily as needed for anxiety (panic  attack). 10 tablet 0  . mirtazapine (REMERON) 30 MG tablet Take 1 tablet (30 mg total) by mouth at bedtime. 30 tablet 6  . pantoprazole (PROTONIX) 40 MG tablet Take 1 tablet (40 mg total) by mouth daily before breakfast. 90 tablet 3  . rosuvastatin (CRESTOR) 20 MG tablet Take 1 tablet (20 mg total) by mouth at bedtime. NEED OV. 30 tablet 1  . sertraline (ZOLOFT) 50 MG tablet TK 1 T PO QD  5  . traMADol (ULTRAM) 50 MG tablet Take 1 tablet (50 mg total) by mouth every 8 (eight) hours as needed. 30 tablet 0  . vitamin B-12 (CYANOCOBALAMIN) 1000 MCG tablet Take 1,000 mcg by mouth daily.    Marland Kitchen  nitroGLYCERIN (NITROSTAT) 0.4 MG SL tablet Place 1 tablet (0.4 mg total) under the tongue every 5 (five) minutes x 3 doses as needed for chest pain. 30 tablet 3   No current facility-administered medications on file prior to visit.   Allergies  Allergen Reactions  . Ampicillin Itching  . Codeine Itching and Nausea And Vomiting  . Nubain [Nalbuphine Hcl] Other (See Comments)    "headache & disoriented"     Social History   Social History  . Marital Status: Married    Spouse Name: N/A  . Number of Children: 4  . Years of Education: N/A   Occupational History  . Retired    Social History Main Topics  . Smoking status: Never Smoker   . Smokeless tobacco: Never Used  . Alcohol Use: No  . Drug Use: No  . Sexual Activity: No   Other Topics Concern  . Not on file   Social History Narrative      Review of Systems  All other systems reviewed and are negative.      Objective:   Physical Exam  Constitutional: She appears well-developed and well-nourished. No distress.  HENT:  Right Ear: External ear normal.  Left Ear: External ear normal.  Nose: Nose normal.  Mouth/Throat: No oropharyngeal exudate.  Eyes: Conjunctivae are normal.  Neck: Neck supple. No thyromegaly present.  Cardiovascular: Normal rate, regular rhythm and normal heart sounds.   Pulmonary/Chest: Effort normal and breath  sounds normal. No stridor. No respiratory distress. She has no wheezes. She has no rales.  Lymphadenopathy:    She has no cervical adenopathy.  Skin: She is not diaphoretic.  Vitals reviewed.         Assessment & Plan:  Loss of weight   patient has gained 2 pounds since her last visit and 3 pounds overall. Continue Remeron. Ultimately I would like the patient to gain up to 80 pounds. Recheck her weight in one month.   If she does not experience any weight gain, I would switch Remeron to Megace.

## 2015-02-04 ENCOUNTER — Telehealth: Payer: Self-pay | Admitting: Family Medicine

## 2015-02-04 NOTE — Telephone Encounter (Signed)
Pt's husband left a message for Dr. Dennard Schaumann to advise him that she now weighs 73 lbs. She is still taking all of her meds.

## 2015-02-05 MED ORDER — MEGESTROL ACETATE 400 MG/10ML PO SUSP: 400.0000 mg | Freq: Every day | ORAL | Status: DC

## 2015-02-05 NOTE — Telephone Encounter (Signed)
She has not gained weight as I'd hoped.  Switch remeron to megace 400 mg a day and recheck weight in 1 month.

## 2015-02-05 NOTE — Telephone Encounter (Signed)
Pts husband aware and rx sent to pharm

## 2015-02-07 ENCOUNTER — Other Ambulatory Visit: Payer: Self-pay | Admitting: Family Medicine

## 2015-02-07 DIAGNOSIS — Z1231 Encounter for screening mammogram for malignant neoplasm of breast: Secondary | ICD-10-CM

## 2015-02-07 DIAGNOSIS — E2839 Other primary ovarian failure: Secondary | ICD-10-CM

## 2015-02-18 ENCOUNTER — Other Ambulatory Visit: Payer: Self-pay

## 2015-02-18 MED ORDER — ROSUVASTATIN CALCIUM 20 MG PO TABS: 20.0000 mg | ORAL_TABLET | Freq: Every day | ORAL | Status: DC

## 2015-03-07 ENCOUNTER — Ambulatory Visit
Admission: RE | Admit: 2015-03-07 | Discharge: 2015-03-07 | Disposition: A | Discharge status: Home / Self Care | Payer: PPO | Source: Ambulatory Visit | Attending: Family Medicine | Admitting: Family Medicine

## 2015-03-07 DIAGNOSIS — Z1231 Encounter for screening mammogram for malignant neoplasm of breast: Secondary | ICD-10-CM

## 2015-03-07 DIAGNOSIS — E2839 Other primary ovarian failure: Secondary | ICD-10-CM

## 2015-03-07 DIAGNOSIS — M810 Age-related osteoporosis without current pathological fracture: Secondary | ICD-10-CM | POA: Diagnosis not present

## 2015-03-08 ENCOUNTER — Encounter: Payer: Self-pay | Admitting: Family Medicine

## 2015-03-15 ENCOUNTER — Ambulatory Visit (INDEPENDENT_AMBULATORY_CARE_PROVIDER_SITE_OTHER): Payer: PPO | Admitting: Cardiology

## 2015-03-15 ENCOUNTER — Encounter: Payer: Self-pay | Admitting: Cardiology

## 2015-03-15 VITALS — BP 90/64 | HR 69 | Ht <= 58 in | Wt 75.0 lb

## 2015-03-15 DIAGNOSIS — E785 Hyperlipidemia, unspecified: Secondary | ICD-10-CM

## 2015-03-15 DIAGNOSIS — I2510 Atherosclerotic heart disease of native coronary artery without angina pectoris: Secondary | ICD-10-CM | POA: Diagnosis not present

## 2015-03-15 DIAGNOSIS — R413 Other amnesia: Secondary | ICD-10-CM | POA: Diagnosis not present

## 2015-03-15 DIAGNOSIS — I252 Old myocardial infarction: Secondary | ICD-10-CM | POA: Diagnosis not present

## 2015-03-15 DIAGNOSIS — F0281 Dementia in other diseases classified elsewhere with behavioral disturbance: Secondary | ICD-10-CM | POA: Diagnosis not present

## 2015-03-15 MED ORDER — ROSUVASTATIN CALCIUM 10 MG PO TABS: 10.0000 mg | ORAL_TABLET | Freq: Every day | ORAL | Status: DC

## 2015-03-15 MED ORDER — NITROGLYCERIN 0.4 MG SL SUBL: 0.4000 mg | SUBLINGUAL_TABLET | SUBLINGUAL | Status: DC | PRN

## 2015-03-15 NOTE — Progress Notes (Signed)
Pamela Kidd Date of Birth: [birth year] Medical Record [id number]  History of Present Illness:  Pamela Kidd is seen for follow up CAD.  She has known CAD with anterior MI in August of 2013 treated with DES of the proximal LAD. EF  recovered.  Repeat cath 5/14 showed nonobstructive disease. Myoview March 2016 showed fixed septal defect without ischemia. EF 78%. Other issues include HLD, gastritis,  and GERD. She also had an event monitor at that time that was unremarkable.   On follow up today she is doing well. She has lost 10 lbs this year with poor appetite and swallowing problems. Had EGD and ENT evaluation without acute findings.  Notes occ. Symptoms of an empty feeling in her chest. 2 episodes where her HR increased to 117. BP chronically low.    Current Outpatient Prescriptions on File Prior to Visit  Medication Sig Dispense Refill  . aspirin EC 81 MG tablet Take 1 tablet (81 mg total) by mouth daily. 90 tablet 3  . folic acid (FOLVITE) 1 MG tablet Take 1 mg by mouth daily.    Marland Kitchen LORazepam (ATIVAN) 0.5 MG tablet Take 1 tablet (0.5 mg total) by mouth 2 (two) times daily as needed for anxiety (panic attack). 10 tablet 0  . megestrol (MEGACE) 400 MG/10ML suspension Take 10 mLs (400 mg total) by mouth daily. 240 mL 3  . mirtazapine (REMERON) 30 MG tablet Take 1 tablet (30 mg total) by mouth at bedtime. 30 tablet 6  . pantoprazole (PROTONIX) 40 MG tablet Take 1 tablet (40 mg total) by mouth daily before breakfast. 90 tablet 3  . traMADol (ULTRAM) 50 MG tablet Take 1 tablet (50 mg total) by mouth every 8 (eight) hours as needed. 30 tablet 0  . vitamin B-12 (CYANOCOBALAMIN) 1000 MCG tablet Take 1,000 mcg by mouth daily.     No current facility-administered medications on file prior to visit.    Allergies  Allergen Reactions  . Ampicillin Itching and Dermatitis  . Cisapride     Other reaction(s): Other (See Comments) Unknown  . Codeine Itching and Nausea And Vomiting    Other  reaction(s): GI Upset (intolerance)  . Nubain [Nalbuphine Hcl] Other (See Comments)    "headache & disoriented"      Past Medical History  Diagnosis Date  . Ischemic cardiomyopathy     a. 08/27/2011 Echo: EF 35-40%, mid-dist anteroseptal/apical akinesis, Gr 1 DD, mild-mod TR, PASP 65mmHg  . Hypotension     a. preventing use of ACEI  . Diverticulosis   . Hyperlipidemia     takes Crestor nightly  . Asthmatic bronchitis     "as a child; haven't had any since ~ I was 76 yr old" (06/08/2012)  . Anemia     "I've always been anemic; never had a transfusion" (06/08/2012)  . H/O hiatal hernia   . Oral cancer ([place]) 2003    a. s/p radiation/chemotherapy  . Emphysema of lung ([place])   . GERD (gastroesophageal reflux disease)     takes Protonix daily  . Constipation     takes Miralax daily prn  . CAD (coronary artery disease)     a. 08/26/2011 Acute Ant STEMI/Cath/PCI: LM nl, LAD 100p -> 2.75x23 Xience Xpedition, LCX 20, minor irregs, RCA 20-3mid, EF 30-35%, anterior/apical AK;  b. 05/2012 Cath: LM nl, LAD patent LAD stent, LCX 30ost, RCA dom, nl, EF 65%.  . Complication of anesthesia     slow to wake up  . Vertigo  occasionally  . Bruises easily     d/t taking Plavix and ASA  . History of colon polyps   . Dry mouth   . PONV (postoperative nausea and vomiting)   . STEMI (ST elevation myocardial infarction) ([place]) 08/2011    anterior/notes 09/07/2011 (06/08/2012)  . Gastritis   . Dementia   . Osteoporosis     Past Surgical History  Procedure Laterality Date  . Bunionectomy Bilateral 1970's  . Shoulder open rotator cuff repair Left 1990's  . Tonsillectomy  ~ 1951  . Vaginal hysterectomy  ~ 1989    partial  . Tubal ligation  1975  . Coronary angioplasty with stent placement  08/2011    "1" (06/08/2012)  . Cardiac catheterization  06/09/2012    patent prox LAD stent, minor nonobstructive LAD disease, 30% ostial LCx, and otherwise widely patent cors; LVEF 65%  . Colonoscopy    . Peg  placement      in 2003 and then removed  . Cholecystectomy  08/18/2012  . Cholecystectomy N/A 08/18/2012    Procedure: LAPAROSCOPIC CHOLECYSTECTOMY WITH INTRAOPERATIVE CHOLANGIOGRAM;  Surgeon: Imogene Burn. Georgette Dover, MD;  Location: [facility];  Service: General;  Laterality: N/A;  . Left heart catheterization with coronary angiogram N/A 08/26/2011    Procedure: LEFT HEART CATHETERIZATION WITH CORONARY ANGIOGRAM;  Surgeon: Peter M Martinique, MD;  Location: [facility] CATH LAB;  Service: Cardiovascular;  Laterality: N/A;  . Percutaneous coronary stent intervention (pci-s) N/A 08/26/2011    Procedure: PERCUTANEOUS CORONARY STENT INTERVENTION (PCI-S);  Surgeon: Peter M Martinique, MD;  Location: [facility] CATH LAB;  Service: Cardiovascular;  Laterality: N/A;  . Left heart catheterization with coronary angiogram N/A 06/09/2012    Procedure: LEFT HEART CATHETERIZATION WITH CORONARY ANGIOGRAM;  Surgeon: Sherren Mocha, MD;  Location: [facility] CATH LAB;  Service: Cardiovascular;  Laterality: N/A;    History  Smoking status  . Never Smoker   Smokeless tobacco  . Never Used    History  Alcohol Use No    Family History  Problem Relation Age of Onset  . Dementia Mother   . Heart disease Father   . Cancer Father     prostate  . Heart disease Paternal Grandfather   . Colon cancer Neg Hx     Review of Systems: The review of systems is per the HPI.  All other systems were reviewed and are negative.  Physical Exam: BP 90/64 mmHg  Pulse 69  Ht 4\' 10"  (1.473 m)  Wt 34.02 kg (75 lb)  BMI 15.68 kg/m2 BP 110/70 supine, 110/80 sitting, 106/74 standing.  Patient is very pleasant and very thin and in no acute distress. Skin is warm and dry. Color is normal.  HEENT is unremarkable. Normocephalic/atraumatic. PERRL. Sclera are nonicteric. Neck is supple. No masses. No JVD. Lungs are clear. Cardiac exam shows a regular rate and rhythm. Abdomen is soft. Extremities are without edema. Gait and ROM are intact. No gross neurologic deficits  noted.  LABORATORY DATA:  Lab Results  Component Value Date   WBC 6.0 08/16/2014   HGB 14.2 08/16/2014   HCT 42.5 08/16/2014   PLT 239 08/16/2014   GLUCOSE 96 08/16/2014   CHOL 126 08/16/2014   TRIG 72 08/16/2014   HDL 67 08/16/2014   LDLCALC 45 08/16/2014   ALT 6 08/16/2014   AST 16 08/16/2014   NA 143 08/16/2014   K 3.7 08/16/2014   CL 107 08/16/2014   CREATININE 1.13* 08/16/2014   BUN 10 08/16/2014   CO2 23 08/16/2014  TSH 4.893* 08/07/2013   INR 1.03 06/08/2012   HGBA1C 5.9* 04/06/2014    Ecg today shows NSR with normal Ecg.  I have personally reviewed and interpreted this study.   Assessment / Plan: 1. CAD - s/p DES of LAD August 2013. Cath in May 2014 showed nonobstructive disease. Myoview in March 2016 was low risk.  No significant angina. Continue ASA 81 mg.   2. Past anterior MI in August of 2013 treated with DES of the proximal LAD. EF has recovered and is normal.   3. HLD - excellent control on statin. Will reduce Crestor to 10 mg daily given her significant weight loss.   4. Palpitaitons. Normal event monitor.   5. Chronic weight loss.  6. Dementia with some anxiety.

## 2015-03-15 NOTE — Patient Instructions (Signed)
We will reduce Crestor to 10 mg daily  Continue your other therapy  I will see you in one year.

## 2015-03-25 DIAGNOSIS — G319 Degenerative disease of nervous system, unspecified: Secondary | ICD-10-CM | POA: Diagnosis not present

## 2015-03-25 DIAGNOSIS — I6782 Cerebral ischemia: Secondary | ICD-10-CM | POA: Diagnosis not present

## 2015-04-02 IMAGING — CR DG CHEST 2V
2 series · 2 of 2 positions shown · non-contrast
Comparison: 05/20/2012

CLINICAL DATA: Shortness of breath for 1 month. Irregular heart
rhythm.

EXAM:
CHEST  2 VIEW

[w chest pa]
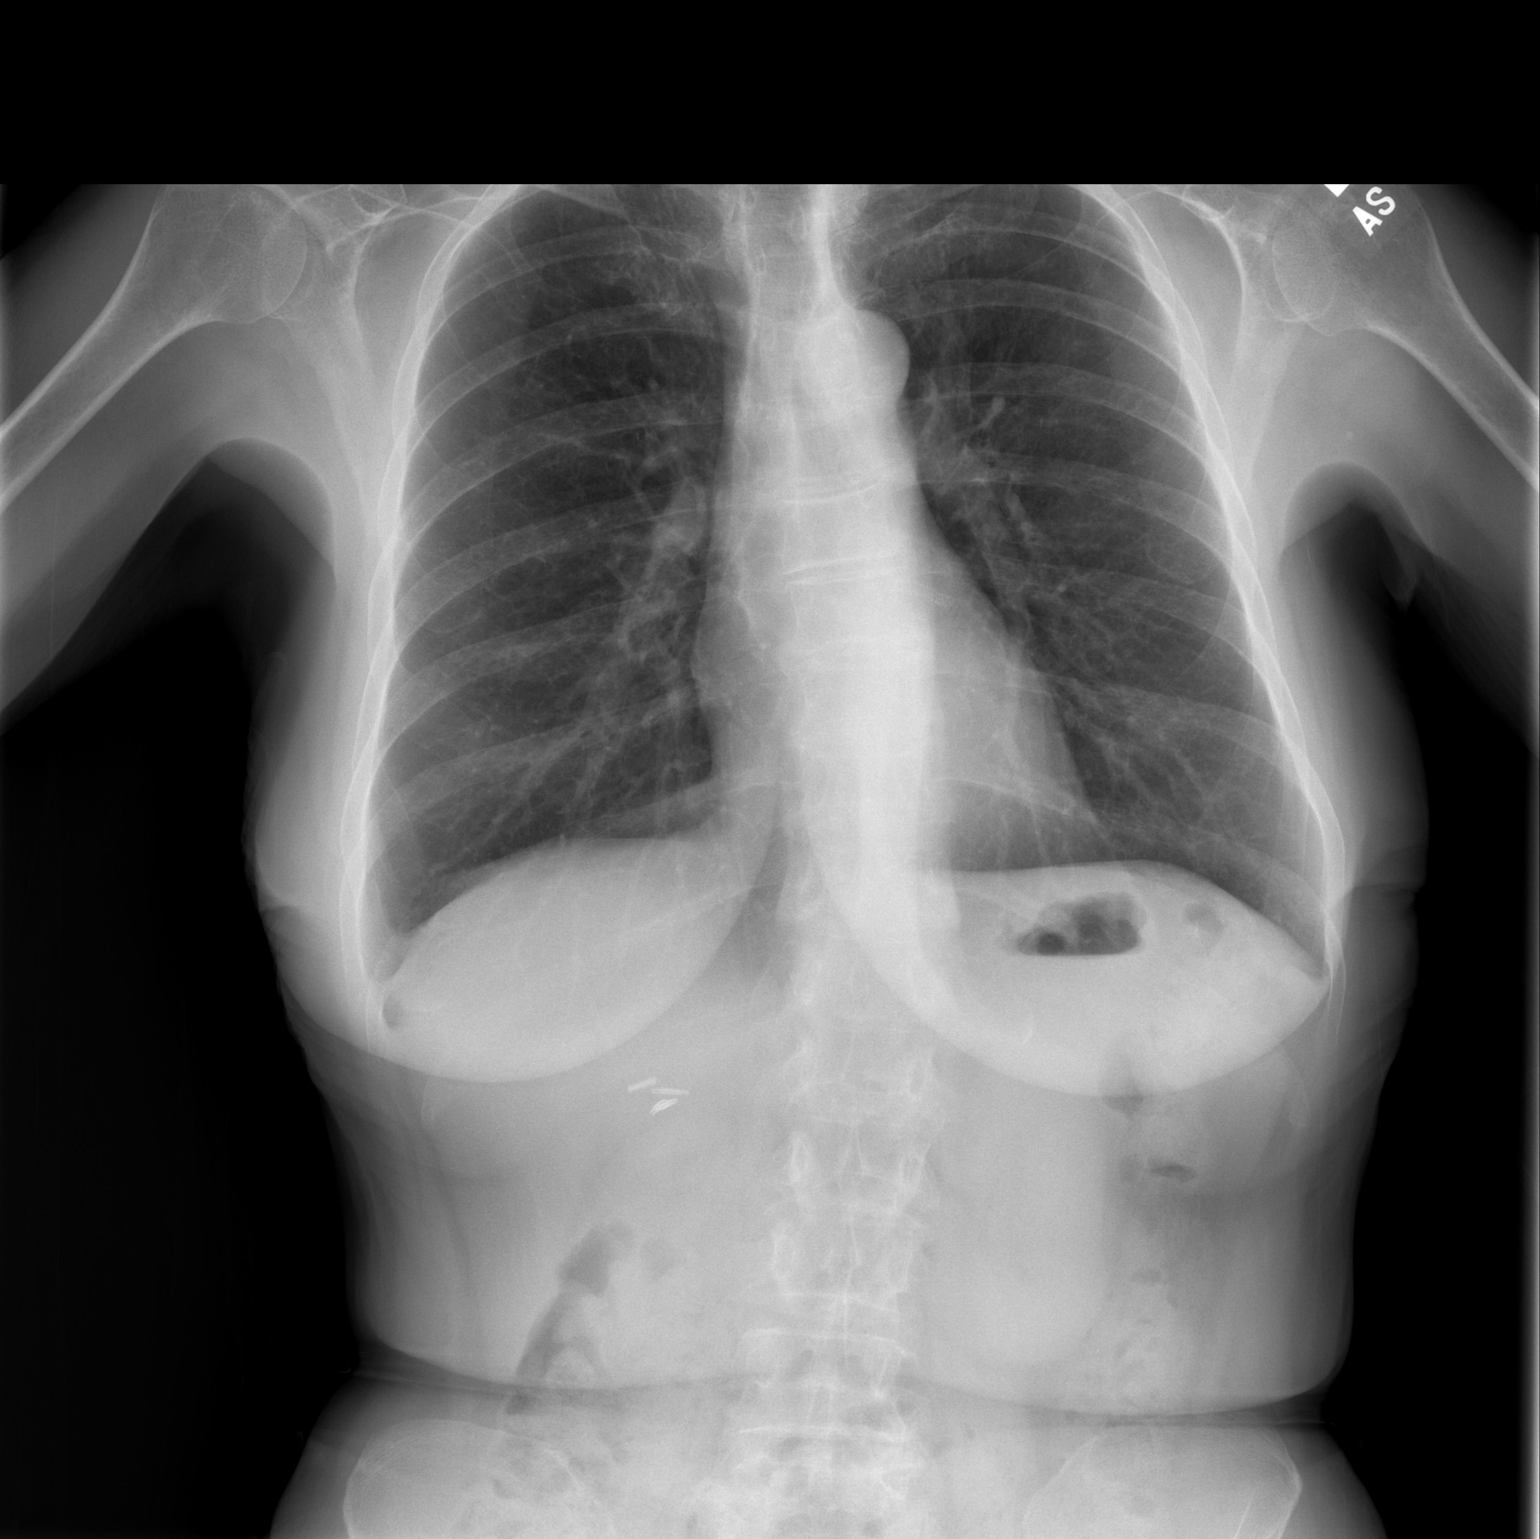

[w chest lat]
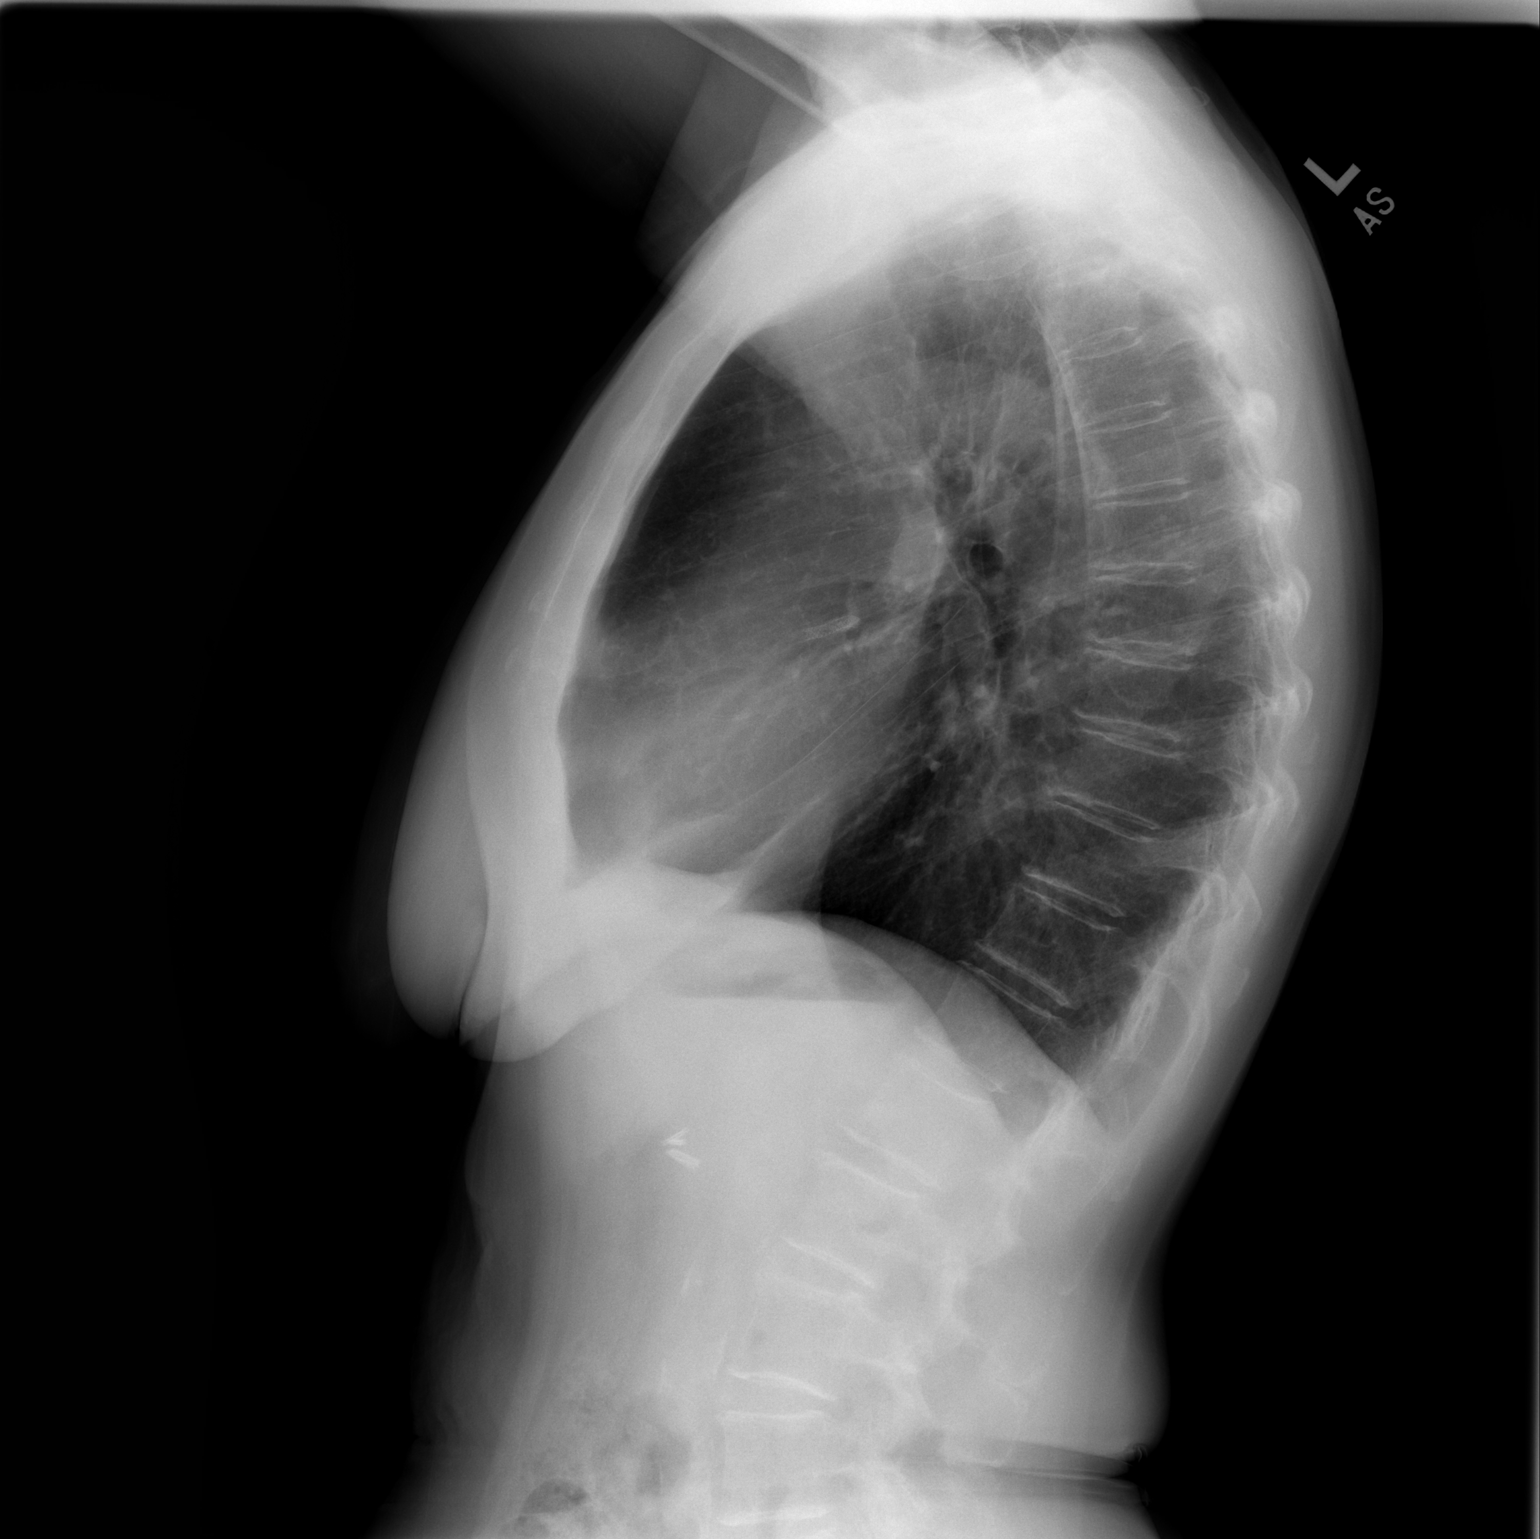

[2 of 2 positions shown; findings below may reference images not displayed]

FINDINGS: The heart size and mediastinal contours are within normal limits.
Both lungs are clear. The visualized skeletal structures are
unremarkable.
IMPRESSION: No active cardiopulmonary disease.

## 2015-04-18 DIAGNOSIS — R413 Other amnesia: Secondary | ICD-10-CM | POA: Diagnosis not present

## 2015-04-18 DIAGNOSIS — F0281 Dementia in other diseases classified elsewhere with behavioral disturbance: Secondary | ICD-10-CM | POA: Diagnosis not present

## 2015-04-22 ENCOUNTER — Telehealth: Payer: Self-pay | Admitting: Cardiology

## 2015-04-22 DIAGNOSIS — R55 Syncope and collapse: Secondary | ICD-10-CM

## 2015-04-22 NOTE — Telephone Encounter (Signed)
New message  Pt c/o Syncope: STAT if syncope occurred within 30 minutes and pt complains of lightheadedness High Priority if episode of passing out, completely, today or in last 24 hours   Did you pass out today? Yes   When is the last time you passed out? Caller wasn't there, the husband called to tell her around 10-11 am   Has this occurred multiple times? Yes   Did you have any symptoms prior to passing out? Husband says that she is usually standing. When she is trying to walk around. She feels light headed about to fall out and she does.   Pt daughter in [place] called. Was not with the pt/ Simply calling it in. Request a call back to discuss if possible

## 2015-04-22 NOTE — Telephone Encounter (Signed)
We can check carotid dopplers but this rarely causes true syncope. She has lost a lot of weight and this may be contributing. She had prior cath and event monitor without acute findings.  Peter Martinique MD, [facility]

## 2015-04-22 NOTE — Telephone Encounter (Signed)
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

## 2015-04-23 ENCOUNTER — Telehealth: Payer: Self-pay | Admitting: Cardiology

## 2015-04-23 NOTE — Telephone Encounter (Signed)
Returned call to patient's daughter n law Mechele Claude.Dr.Jordan's recommendations given.S[facility]uler will call back to schedule carotid dopplers.

## 2015-04-23 NOTE — Addendum Note (Signed)
Addended by: Kathyrn Lass on: 04/23/2015 10:23 AM   Modules accepted: Orders

## 2015-04-23 NOTE — Telephone Encounter (Signed)
Called and left a voicemail for the daughter-in-law Mechele Claude to call me back to schedule the carotid.

## 2015-05-02 ENCOUNTER — Ambulatory Visit (HOSPITAL_COMMUNITY)
Admission: RE | Admit: 2015-05-02 | Discharge: 2015-05-02 | Disposition: A | Discharge status: Home / Self Care | Payer: PPO | Source: Ambulatory Visit | Attending: Cardiology | Admitting: Cardiology

## 2015-05-02 ENCOUNTER — Telehealth: Payer: Self-pay | Admitting: Gastroenterology

## 2015-05-02 DIAGNOSIS — I255 Ischemic cardiomyopathy: Secondary | ICD-10-CM | POA: Insufficient documentation

## 2015-05-02 DIAGNOSIS — I6523 Occlusion and stenosis of bilateral carotid arteries: Secondary | ICD-10-CM | POA: Insufficient documentation

## 2015-05-02 DIAGNOSIS — K219 Gastro-esophageal reflux disease without esophagitis: Secondary | ICD-10-CM | POA: Insufficient documentation

## 2015-05-02 DIAGNOSIS — E785 Hyperlipidemia, unspecified: Secondary | ICD-10-CM | POA: Insufficient documentation

## 2015-05-02 DIAGNOSIS — R55 Syncope and collapse: Secondary | ICD-10-CM | POA: Diagnosis not present

## 2015-05-02 MED ORDER — PANTOPRAZOLE SODIUM 40 MG PO TBEC: 40.0000 mg | DELAYED_RELEASE_TABLET | Freq: Every day | ORAL | Status: DC

## 2015-05-02 NOTE — Telephone Encounter (Signed)
Prescription refilled as requested, follow up will be needed for further refills

## 2015-05-30 DIAGNOSIS — R441 Visual hallucinations: Secondary | ICD-10-CM | POA: Diagnosis not present

## 2015-05-30 DIAGNOSIS — F0281 Dementia in other diseases classified elsewhere with behavioral disturbance: Secondary | ICD-10-CM | POA: Diagnosis not present

## 2015-05-30 DIAGNOSIS — G3183 Dementia with Lewy bodies: Secondary | ICD-10-CM | POA: Diagnosis not present

## 2015-06-04 ENCOUNTER — Encounter (HOSPITAL_COMMUNITY): Payer: Self-pay | Admitting: Emergency Medicine

## 2015-06-04 ENCOUNTER — Ambulatory Visit (HOSPITAL_COMMUNITY)
Admission: EM | Admit: 2015-06-04 | Discharge: 2015-06-04 | Disposition: A | Discharge status: Home / Self Care | Payer: PPO | Attending: Family Medicine | Admitting: Family Medicine

## 2015-06-04 DIAGNOSIS — T7840XA Allergy, unspecified, initial encounter: Secondary | ICD-10-CM | POA: Diagnosis not present

## 2015-06-04 MED ORDER — HYDROXYZINE HCL 10 MG PO TABS
25.0000 mg | ORAL_TABLET | Freq: Three times a day (TID) | ORAL | Status: DC
Start: 2015-06-04 — End: 2015-08-02

## 2015-06-04 NOTE — ED Notes (Signed)
C/o redness under bilateral eyes which was noticed this morning States area is painful No new product used No drainage Used hot compresses as tx

## 2015-06-04 NOTE — ED Provider Notes (Signed)
CSN: [id number]     Arrival date & time 06/04/15  22 History   First MD Initiated Contact with Patient 06/04/15 1513     Chief Complaint  Patient presents with  . Skin Discoloration   (Consider location/radiation/quality/duration/timing/severity/associated sxs/prior Treatment) HPI History obtained from patient:  Pt presents with the cc of: red swollen under left eye Duration of symptoms:since this morning Treatment prior to arrival:hot compresses Context:spoke with daughter in law who is a hospice nurse and was advised to apply hot compress to face.  Other symptoms include: itching Pain score:1 FAMILY HISTORY: mother with dementia SOCIAL HISTORY: non smoker   Past Medical History  Diagnosis Date  . Ischemic cardiomyopathy     a. 08/27/2011 Echo: EF 35-40%, mid-dist anteroseptal/apical akinesis, Gr 1 DD, mild-mod TR, PASP 56mmHg  . Hypotension     a. preventing use of ACEI  . Diverticulosis   . Hyperlipidemia     takes Crestor nightly  . Asthmatic bronchitis     "as a child; haven't had any since ~ I was 26 yr old" (06/08/2012)  . Anemia     "I've always been anemic; never had a transfusion" (06/08/2012)  . H/O hiatal hernia   . 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 s/p radiation/chemotherapy  . Emphysema of lung ([place])   . GERD (gastroesophageal reflux disease)     takes Protonix daily  . Constipation     takes Miralax daily prn  . CAD (coronary artery disease)     a. 08/26/2011 Acute Ant STEMI/Cath/PCI: LM nl, LAD 100p -> 2.75x23 Xience Xpedition, LCX 20, minor irregs, RCA 20-16mid, EF 30-35%, anterior/apical AK;  b. 05/2012 Cath: LM nl, LAD patent LAD stent, LCX 30ost, RCA dom, nl, EF 65%.  . Complication of anesthesia     slow to wake up  . Vertigo     occasionally  . Bruises easily     d/t taking Plavix and ASA  . History of colon polyps   . Dry mouth   . PONV (postoperative nausea and vomiting)   . STEMI (ST elevation myocardial infarction) ([place]) 08/2011    anterior/notes  09/07/2011 (06/08/2012)  . Gastritis   . Dementia   . Osteoporosis    Past Surgical History  Procedure Laterality Date  . Bunionectomy Bilateral 1970's  . Shoulder open rotator cuff repair Left 1990's  . Tonsillectomy  ~ 1951  . Vaginal hysterectomy  ~ 1989    partial  . Tubal ligation  1975  . Coronary angioplasty with stent placement  08/2011    "1" (06/08/2012)  . Cardiac catheterization  06/09/2012    patent prox LAD stent, minor nonobstructive LAD disease, 30% ostial LCx, and otherwise widely patent cors; LVEF 65%  . Colonoscopy    . Peg placement      in 2003 and then removed  . Cholecystectomy  08/18/2012  . Cholecystectomy N/A 08/18/2012    Procedure: LAPAROSCOPIC CHOLECYSTECTOMY WITH INTRAOPERATIVE CHOLANGIOGRAM;  Surgeon: Imogene Burn. Georgette Dover, MD;  Location: [facility];  Service: General;  Laterality: N/A;  . Left heart catheterization with coronary angiogram N/A 08/26/2011    Procedure: LEFT HEART CATHETERIZATION WITH CORONARY ANGIOGRAM;  Surgeon: Peter M Martinique, MD;  Location: [facility] CATH LAB;  Service: Cardiovascular;  Laterality: N/A;  . Percutaneous coronary stent intervention (pci-s) N/A 08/26/2011    Procedure: PERCUTANEOUS CORONARY STENT INTERVENTION (PCI-S);  Surgeon: Peter M Martinique, MD;  Location: [facility] CATH LAB;  Service: Cardiovascular;  Laterality: N/A;  . Left heart catheterization  with coronary angiogram N/A 06/09/2012    Procedure: LEFT HEART CATHETERIZATION WITH CORONARY ANGIOGRAM;  Surgeon: Sherren Mocha, MD;  Location: [facility] CATH LAB;  Service: Cardiovascular;  Laterality: N/A;   Family History  Problem Relation Age of Onset  . Dementia Mother   . Heart disease Father   . Cancer Father     prostate  . Heart disease Paternal Grandfather   . Colon cancer Neg Hx    Social History  Substance Use Topics  . Smoking status: Never Smoker   . Smokeless tobacco: Never Used  . Alcohol Use: No   OB History    No data available     Review of Systems  Allergies  Ampicillin;  Cisapride; Codeine; and Nubain  Home Medications   Prior to Admission medications   Medication Sig Start Date End Date Taking? Authorizing Provider  aspirin EC 81 MG tablet Take 1 tablet (81 mg total) by mouth daily. 09/08/12   Peter M Martinique, MD  folic acid (FOLVITE) 1 MG tablet Take 1 mg by mouth daily.    Historical Provider, MD  hydrOXYzine (ATARAX/VISTARIL) 10 MG tablet Take 2.5 tablets (25 mg total) by mouth 3 (three) times daily. 06/04/15   Konrad Felix, PA  LORazepam (ATIVAN) 0.5 MG tablet Take 1 tablet (0.5 mg total) by mouth 2 (two) times daily as needed for anxiety (panic attack). 04/26/14   Susy Frizzle, MD  megestrol (MEGACE) 400 MG/10ML suspension Take 10 mLs (400 mg total) by mouth daily. 02/05/15   Susy Frizzle, MD  mirtazapine (REMERON) 30 MG tablet Take 1 tablet (30 mg total) by mouth at bedtime. 11/29/14   Susy Frizzle, MD  nitroGLYCERIN (NITROSTAT) 0.4 MG SL tablet Place 1 tablet (0.4 mg total) under the tongue every 5 (five) minutes x 3 doses as needed for chest pain. 03/15/15 06/04/17  Peter M Martinique, MD  pantoprazole (PROTONIX) 40 MG tablet Take 1 tablet (40 mg total) by mouth daily before breakfast. 05/02/15   Milus Banister, MD  rosuvastatin (CRESTOR) 10 MG tablet Take 1 tablet (10 mg total) by mouth daily. 03/15/15   Peter M Martinique, MD  sertraline (ZOLOFT) 100 MG tablet Take 1 tablet by mouth daily. 01/09/15   Historical Provider, MD  traMADol (ULTRAM) 50 MG tablet Take 1 tablet (50 mg total) by mouth every 8 (eight) hours as needed. 05/15/14   Alycia Rossetti, MD  vitamin B-12 (CYANOCOBALAMIN) 1000 MCG tablet Take 1,000 mcg by mouth daily.    Historical Provider, MD   Meds Ordered and Administered this Visit  Medications - No data to display  BP 153/72 mmHg  Pulse 73  Temp(Src) 98.5 F (36.9 C) (Oral)  Resp 12  SpO2 100% No data found.   Physical Exam NURSES NOTES AND VITAL SIGNS REVIEWED. CONSTITUTIONAL: Well developed, well nourished, no acute  distress HEENT: normocephalic, atraumatic EYES: Conjunctiva normal, under left eye, there is redness, with edema, not tender, not suggestive of cellulitis. Residual black make up noted under both eyes.  Right eye, minimally affected.  NECK:normal ROM, supple, no adenopathy PULMONARY:No respiratory distress, normal effort ABDOMINAL: Soft, ND, NT BS+, No CVAT MUSCULOSKELETAL: Normal ROM of all extremities,  SKIN: warm and dry without rash PSYCHIATRIC: Mood and affect, behavior are normal  ED Course  Procedures (including critical care time)  Labs Review Labs Reviewed - No data to display  Imaging Review No results found.   Visual Acuity Review  Right Eye Distance:   Left Eye  Distance:   Bilateral Distance:    Right Eye Near:   Left Eye Near:    Bilateral Near:         MDM   1. Allergic reaction, initial encounter     Patient is reassured that there are no issues that require transfer to higher level of care at this time or additional tests. Patient is advised to continue home symptomatic treatment. Patient is advised that if there are new or worsening symptoms to attend the emergency department, contact primary care provider, or return to UC. Instructions of care provided discharged home in stable condition.    THIS NOTE WAS GENERATED USING A VOICE RECOGNITION SOFTWARE PROGRAM. ALL REASONABLE EFFORTS  WERE MADE TO PROOFREAD THIS DOCUMENT FOR ACCURACY.  I have verbally reviewed the discharge instructions with the patient. A printed AVS was given to the patient.  All questions were answered prior to discharge.      Konrad Felix, PA 06/04/15 1534

## 2015-06-04 NOTE — Discharge Instructions (Signed)

## 2015-06-16 IMAGING — CR DG FOOT COMPLETE 3+V*L*
3 series · 3 of 3 positions shown · non-contrast
Comparison: 05/15/2014

CLINICAL DATA: History of left first toe fracture just over 1 month
ago with persistent pain in the first toe.

EXAM:
LEFT FOOT - COMPLETE 3+ VIEW

[view not recorded (1 of 3)]
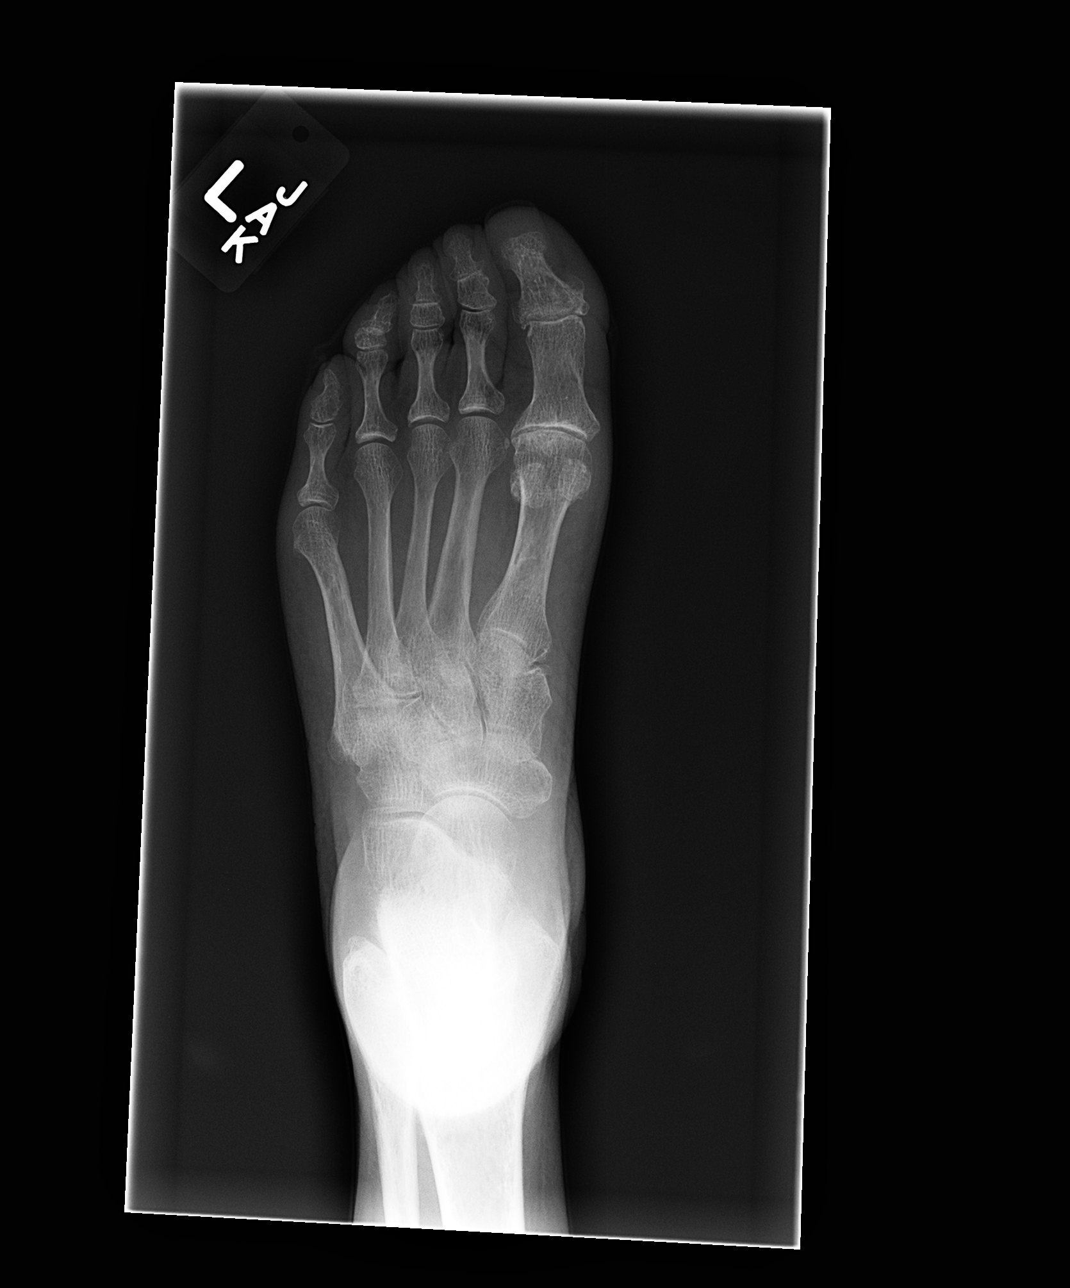

[view not recorded (2 of 3)]
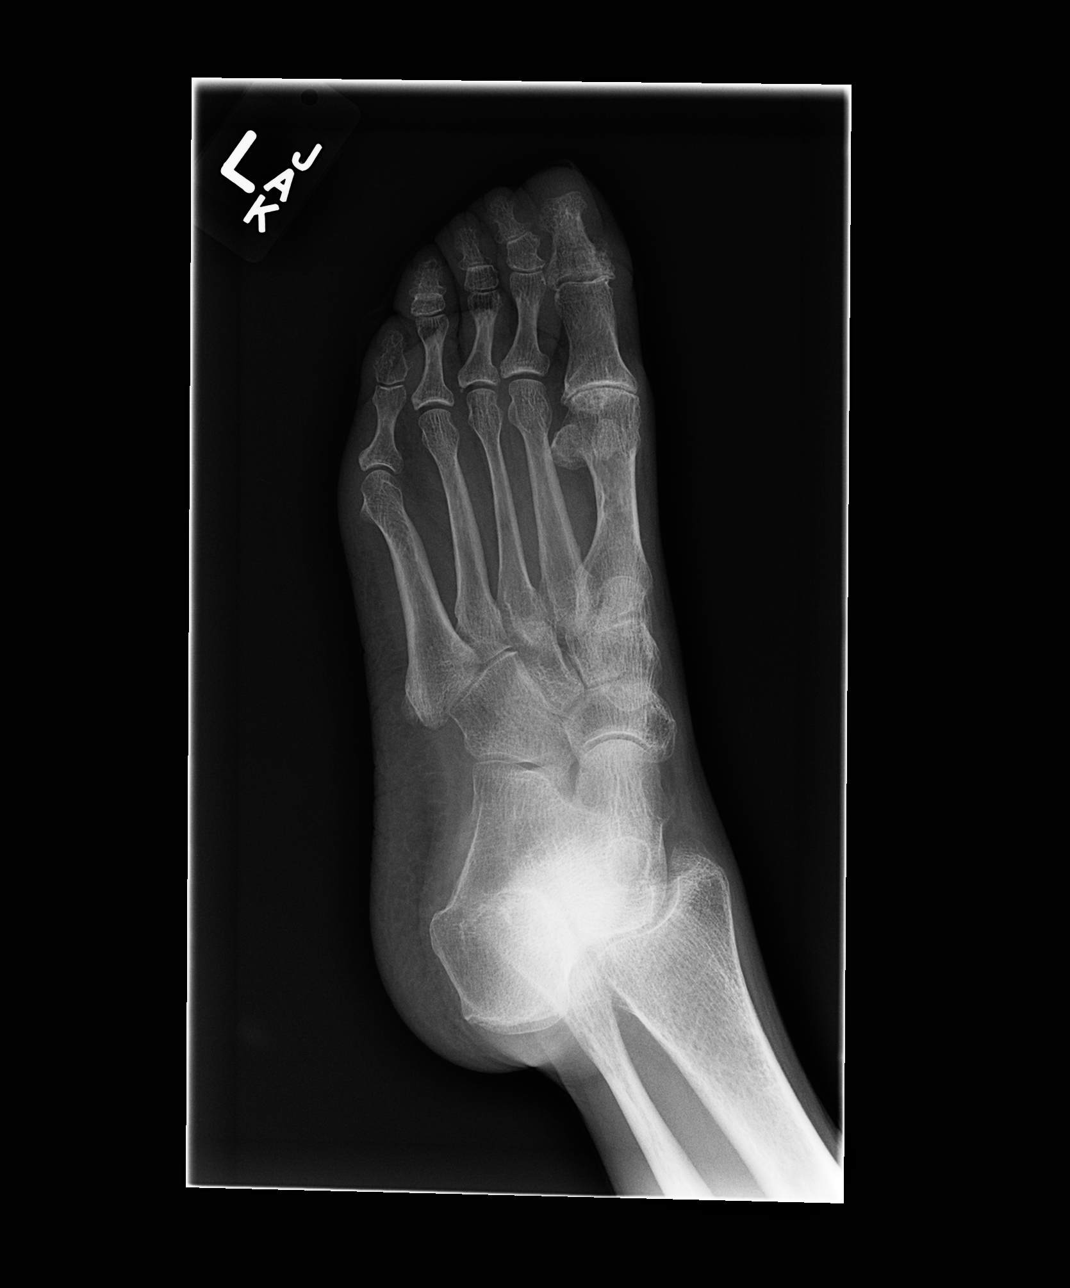

[view not recorded (3 of 3)]
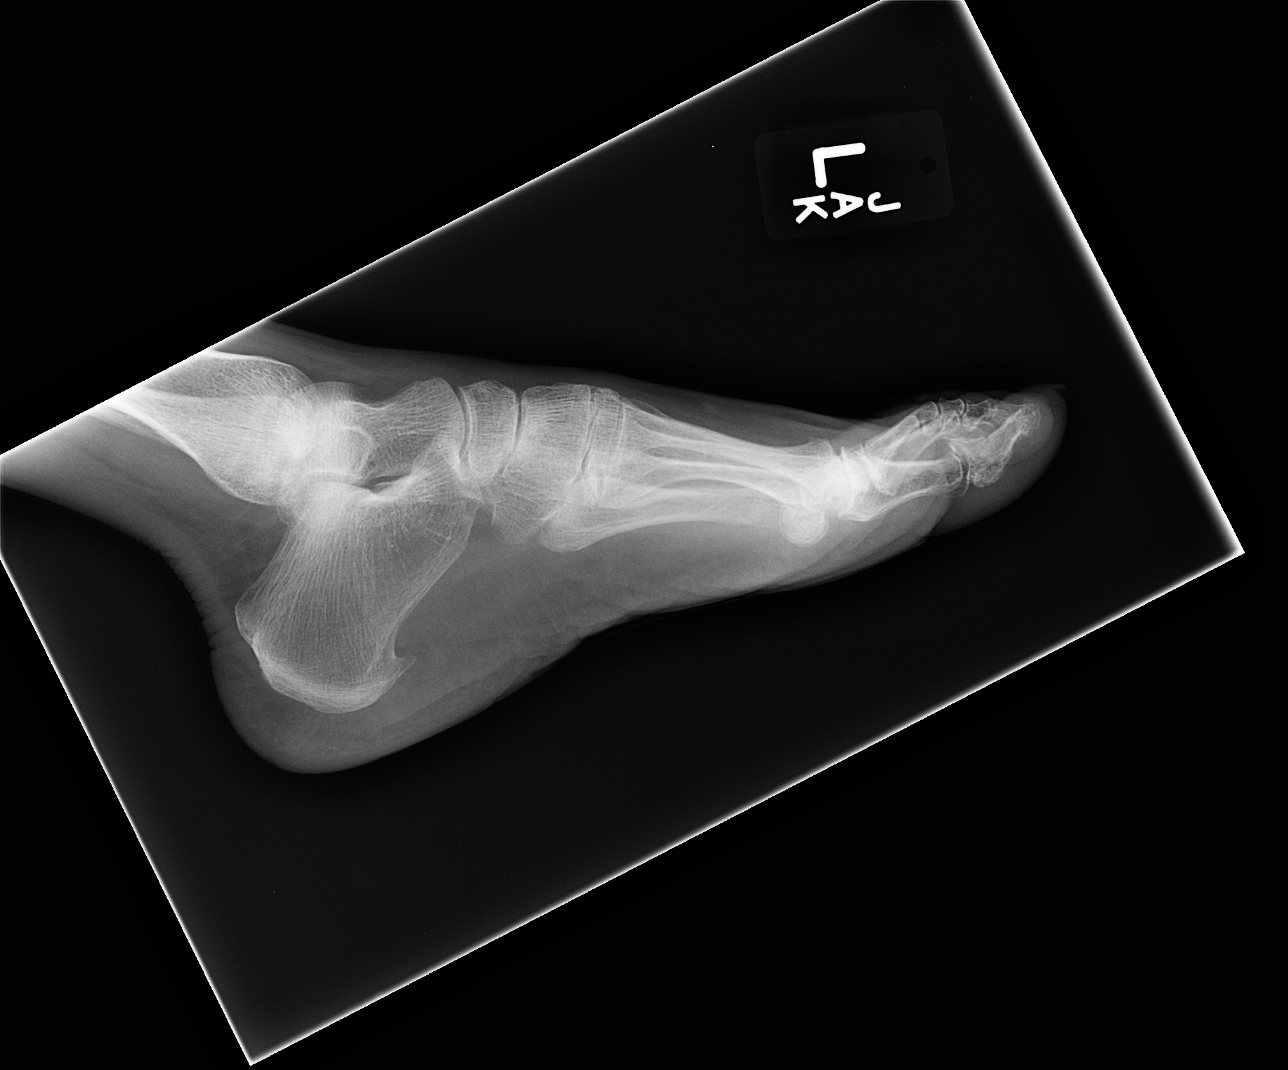

[3 of 3 positions shown; findings below may reference images not displayed]

FINDINGS: Previously identified comminuted and minimally displaced fracture of
the distal phalanx shows some interval healing with some
nondisplaced lucency remaining in the bone. No evidence of
dislocation. No new fractures identified. Stable degenerative
changes involving the first MTP joint. Soft tissues are
unremarkable.
IMPRESSION: Evidence of interval healing of the displaced left first distal
phalanx fracture. Some nondisplaced lucency remains in the bone and
the fracture has not completely healed.

## 2015-06-20 ENCOUNTER — Other Ambulatory Visit: Payer: Self-pay | Admitting: Family Medicine

## 2015-06-21 ENCOUNTER — Telehealth: Payer: Self-pay | Admitting: Family Medicine

## 2015-06-21 NOTE — Telephone Encounter (Signed)
Refill appropriate and filled per protocol. 

## 2015-07-19 ENCOUNTER — Other Ambulatory Visit: Payer: Self-pay | Admitting: Family Medicine

## 2015-07-24 ENCOUNTER — Ambulatory Visit (HOSPITAL_COMMUNITY): Payer: PPO

## 2015-07-24 ENCOUNTER — Ambulatory Visit (HOSPITAL_COMMUNITY)
Admission: EM | Admit: 2015-07-24 | Discharge: 2015-07-24 | Disposition: A | Discharge status: Home / Self Care | Payer: PPO | Attending: Emergency Medicine | Admitting: Emergency Medicine

## 2015-07-24 ENCOUNTER — Encounter (HOSPITAL_COMMUNITY): Payer: Self-pay | Admitting: Family Medicine

## 2015-07-24 ENCOUNTER — Ambulatory Visit (INDEPENDENT_AMBULATORY_CARE_PROVIDER_SITE_OTHER): Payer: PPO

## 2015-07-24 DIAGNOSIS — S93402A Sprain of unspecified ligament of left ankle, initial encounter: Secondary | ICD-10-CM

## 2015-07-24 DIAGNOSIS — M7989 Other specified soft tissue disorders: Secondary | ICD-10-CM | POA: Diagnosis not present

## 2015-07-24 MED ORDER — NAPROXEN 500 MG PO TABS: 500.0000 mg | ORAL_TABLET | Freq: Two times a day (BID) | ORAL | Status: DC

## 2015-07-24 NOTE — Discharge Instructions (Signed)
Keep ankle/foot elevated. May use ace wrap during the day- may take it off at night. Take Naproxen twice a day as needed for pain/swelling. Follow-up with your primary care provider if ankle is not improving within 3 days.   Ankle Sprain An ankle sprain is an injury to the strong, fibrous tissues (ligaments) that hold the bones of your ankle joint together.  CAUSES An ankle sprain is usually caused by a fall or by twisting your ankle. Ankle sprains most commonly occur when you step on the outer edge of your foot, and your ankle turns inward. People who participate in sports are more prone to these types of injuries.  SYMPTOMS   Pain in your ankle. The pain may be present at rest or only when you are trying to stand or walk.  Swelling.  Bruising. Bruising may develop immediately or within 1 to 2 days after your injury.  Difficulty standing or walking, particularly when turning corners or changing directions. DIAGNOSIS  Your caregiver will ask you details about your injury and perform a physical exam of your ankle to determine if you have an ankle sprain. During the physical exam, your caregiver will press on and apply pressure to specific areas of your foot and ankle. Your caregiver will try to move your ankle in certain ways. An X-ray exam may be done to be sure a bone was not broken or a ligament did not separate from one of the bones in your ankle (avulsion fracture).  TREATMENT  Certain types of braces can help stabilize your ankle. Your caregiver can make a recommendation for this. Your caregiver may recommend the use of medicine for pain. If your sprain is severe, your caregiver may refer you to a surgeon who helps to restore function to parts of your skeletal system (orthopedist) or a physical therapist. [place] ice to your injury for 1-2 days or as directed by your caregiver. Applying ice helps to reduce inflammation and pain.  Put ice in a plastic bag.  Place a  towel between your skin and the bag.  Leave the ice on for 15-20 minutes at a time, every 2 hours while you are awake.  Only take over-the-counter or prescription medicines for pain, discomfort, or fever as directed by your caregiver.  Elevate your injured ankle above the level of your heart as much as possible for 2-3 days.  If your caregiver recommends crutches, use them as instructed. Gradually put weight on the affected ankle. Continue to use crutches or a cane until you can walk without feeling pain in your ankle.  If you have a plaster splint, wear the splint as directed by your caregiver. Do not rest it on anything harder than a pillow for the first 24 hours. Do not put weight on it. Do not get it wet. You may take it off to take a shower or bath.  You may have been given an elastic bandage to wear around your ankle to provide support. If the elastic bandage is too tight (you have numbness or tingling in your foot or your foot becomes cold and blue), adjust the bandage to make it comfortable.  If you have an air splint, you may blow more air into it or let air out to make it more comfortable. You may take your splint off at night and before taking a shower or bath. Wiggle your toes in the splint several times per day to decrease swelling. SEEK MEDICAL CARE IF:  You have rapidly increasing bruising or swelling.  Your toes feel extremely cold or you lose feeling in your foot.  Your pain is not relieved with medicine. SEEK IMMEDIATE MEDICAL CARE IF:  Your toes are numb or blue.  You have severe pain that is increasing. MAKE SURE YOU:   Understand these instructions.  Will watch your condition.  Will get help right away if you are not doing well or get worse.   This information is not intended to replace advice given to you by your health care provider. Make sure you discuss any questions you have with your health care provider.   Document Released: 01/05/2005 Document  Revised: 01/26/2014 Document Reviewed: 01/17/2011 Elsevier Interactive Patient Education [employer].

## 2015-07-24 NOTE — ED Notes (Signed)
Pt here for right ankle pain and swelling, also swelling in foot. sts most pain in ankle. sts she tripped injuring ankle.

## 2015-07-25 NOTE — ED Provider Notes (Signed)
CSN: [id number]     Arrival date & time 07/24/15  1432 History   First MD Initiated Contact with Patient 07/24/15 1632     Chief Complaint  Patient presents with  . Ankle Pain   (Consider location/radiation/quality/duration/timing/severity/associated sxs/prior Treatment) HPI Comments: Patient was walking at home when she twisted her left ankle when tripping over a rug. She did not fall. Has been trying to ice ankle and keep elevated but swollen. Concerned over fracture. Has taken Aspirin for pain with minimal relief. Has difficulty swallowing pills due to previous throat surgery for cancer.   Patient is a 70 y.o. female presenting with ankle pain. The history is provided by the patient and the spouse.  Ankle Pain Location:  Ankle Time since incident:  2 days Injury: yes   Mechanism of injury comment:  Twisted ankle tripping on a carpet Pain details:    Quality:  Throbbing   Severity:  Mild   Onset quality:  Sudden   Duration:  2 days   Timing:  Constant Dislocation: no   Associated symptoms: swelling   Associated symptoms: no fever and no numbness     Past Medical History  Diagnosis Date  . Ischemic cardiomyopathy     a. 08/27/2011 Echo: EF 35-40%, mid-dist anteroseptal/apical akinesis, Gr 1 DD, mild-mod TR, PASP 67mmHg  . Hypotension     a. preventing use of ACEI  . Diverticulosis   . Hyperlipidemia     takes Crestor nightly  . Asthmatic bronchitis     "as a child; haven't had any since ~ I was 66 yr old" (06/08/2012)  . Anemia     "I've always been anemic; never had a transfusion" (06/08/2012)  . H/O hiatal hernia   . Oral cancer ([place]) 2003    a. s/p radiation/chemotherapy  . Emphysema of lung ([place])   . GERD (gastroesophageal reflux disease)     takes Protonix daily  . Constipation     takes Miralax daily prn  . CAD (coronary artery disease)     a. 08/26/2011 Acute Ant STEMI/Cath/PCI: LM nl, LAD 100p -> 2.75x23 Xience Xpedition, LCX 20, minor irregs, RCA 20-11mid, EF  30-35%, anterior/apical AK;  b. 05/2012 Cath: LM nl, LAD patent LAD stent, LCX 30ost, RCA dom, nl, EF 65%.  . Complication of anesthesia     slow to wake up  . Vertigo     occasionally  . Bruises easily     d/t taking Plavix and ASA  . History of colon polyps   . Dry mouth   . PONV (postoperative nausea and vomiting)   . STEMI (ST elevation myocardial infarction) ([place]) 08/2011    anterior/notes 09/07/2011 (06/08/2012)  . Gastritis   . Dementia   . Osteoporosis    Past Surgical History  Procedure Laterality Date  . Bunionectomy Bilateral 1970's  . Shoulder open rotator cuff repair Left 1990's  . Tonsillectomy  ~ 1951  . Vaginal hysterectomy  ~ 1989    partial  . Tubal ligation  1975  . Coronary angioplasty with stent placement  08/2011    "1" (06/08/2012)  . Cardiac catheterization  06/09/2012    patent prox LAD stent, minor nonobstructive LAD disease, 30% ostial LCx, and otherwise widely patent cors; LVEF 65%  . Colonoscopy    . Peg placement      in 2003 and then removed  . Cholecystectomy  08/18/2012  . Cholecystectomy N/A 08/18/2012    Procedure: LAPAROSCOPIC CHOLECYSTECTOMY WITH INTRAOPERATIVE CHOLANGIOGRAM;  Surgeon: Rodman Key  Oren Section, MD;  Location: [facility];  Service: General;  Laterality: N/A;  . Left heart catheterization with coronary angiogram N/A 08/26/2011    Procedure: LEFT HEART CATHETERIZATION WITH CORONARY ANGIOGRAM;  Surgeon: Peter M Martinique, MD;  Location: [facility] CATH LAB;  Service: Cardiovascular;  Laterality: N/A;  . Percutaneous coronary stent intervention (pci-s) N/A 08/26/2011    Procedure: PERCUTANEOUS CORONARY STENT INTERVENTION (PCI-S);  Surgeon: Peter M Martinique, MD;  Location: [facility] C[facility] LAB;  Service: Cardiovascular;  Laterality: N/A;  . Left heart catheterization with coronary angiogram N/A 06/09/2012    Procedure: LEFT HEART CATHETERIZATION WITH CORONARY ANGIOGRAM;  Surgeon: Sherren Mocha, MD;  Location: [facility] CATH LAB;  Service: Cardiovascular;  Laterality: N/A;   Family  History  Problem Relation Age of Onset  . Dementia Mother   . Heart disease Father   . Cancer Father     prostate  . Heart disease Paternal Grandfather   . Colon cancer Neg Hx    Social History  Substance Use Topics  . Smoking status: Never Smoker   . Smokeless tobacco: Never Used  . Alcohol Use: No   OB History    No data available     Review of Systems  Constitutional: Negative for fever.  Musculoskeletal: Positive for joint swelling.  Skin: Positive for color change.    Allergies  Ampicillin; Cisapride; Codeine; and Nubain  Home Medications   Prior to Admission medications   Medication Sig Start Date End Date Taking? Authorizing Provider  aspirin EC 81 MG tablet Take 1 tablet (81 mg total) by mouth daily. 09/08/12   Peter M Martinique, MD  folic acid (FOLVITE) 1 MG tablet Take 1 mg by mouth daily.    Historical Provider, MD  hydrOXYzine (ATARAX/VISTARIL) 10 MG tablet Take 2.5 tablets (25 mg total) by mouth 3 (three) times daily. 06/04/15   Konrad Felix, PA  LORazepam (ATIVAN) 0.5 MG tablet Take 1 tablet (0.5 mg total) by mouth 2 (two) times daily as needed for anxiety (panic attack). 04/26/14   Susy Frizzle, MD  megestrol (MEGACE) 400 MG/10ML suspension Take 10 mLs (400 mg total) by mouth daily. 02/05/15   Susy Frizzle, MD  mirtazapine (REMERON) 30 MG tablet TAKE ONE TABLET BY MOUTH AT BEDTIME 07/19/15   Susy Frizzle, MD  naproxen (NAPROSYN) 500 MG tablet Take 1 tablet (500 mg total) by mouth 2 (two) times daily. 07/24/15   Katy Apo, NP  nitroGLYCERIN (NITROSTAT) 0.4 MG SL tablet Place 1 tablet (0.4 mg total) under the tongue every 5 (five) minutes x 3 doses as needed for chest pain. 03/15/15 06/04/17  Peter M Martinique, MD  pantoprazole (PROTONIX) 40 MG tablet Take 1 tablet (40 mg total) by mouth daily before breakfast. 05/02/15   Milus Banister, MD  rosuvastatin (CRESTOR) 10 MG tablet Take 1 tablet (10 mg total) by mouth daily. 03/15/15   Peter M Martinique, MD    sertraline (ZOLOFT) 100 MG tablet Take 1 tablet by mouth daily. 01/09/15   Historical Provider, MD  traMADol (ULTRAM) 50 MG tablet Take 1 tablet (50 mg total) by mouth every 8 (eight) hours as needed. 05/15/14   Alycia Rossetti, MD  vitamin B-12 (CYANOCOBALAMIN) 1000 MCG tablet Take 1,000 mcg by mouth daily.    Historical Provider, MD   Meds Ordered and Administered this Visit  Medications - No data to display  BP 95/61 mmHg  Pulse 74  Temp(Src) 98 F (36.7 C) (Oral)  Resp 12  SpO2 99% No data found.   Physical Exam  Constitutional: She is oriented to person, place, and time. She appears well-developed. She appears cachectic.  Musculoskeletal:       Left ankle: She exhibits decreased range of motion and swelling. Tenderness. Lateral malleolus tenderness found. Achilles tendon normal.       Feet:  Left ankle swelling around lateral malleolus. Slight ecchymosis present. Tender. Slight swelling near medial malleolus and tender. No bruising medially. Pulses are normal. Has pain with internal rotation and some flexion.   Neurological: She is alert and oriented to person, place, and time. No sensory deficit. She exhibits normal muscle tone.  Skin: Skin is warm and dry.    ED Course  Procedures (including critical care time)  Labs Review Labs Reviewed - No data to display  Imaging Review Dg Ankle Complete Left  07/24/2015  CLINICAL DATA:  [place] 2 days ago injuring LEFT ankle, pain and swelling LEFT ankle especially at lateral malleolus, initial encounter EXAM: LEFT ANKLE COMPLETE - 3+ VIEW COMPARISON:  None FINDINGS: Diffuse soft tissue swelling. Osseous demineralization. Joint spaces preserved. Plantar calcaneal spurring. No acute fracture, dislocation, or bone destruction. IMPRESSION: Soft tissue swelling without acute bony abnormalities. Osseous demineralization with calcaneal spurring. Electronically Signed   By: Lavonia Dana M.D.   On: 07/24/2015 16:32     Visual Acuity  Review  Right Eye Distance:   Left Eye Distance:   Bilateral Distance:    Right Eye Near:   Left Eye Near:    Bilateral Near:         MDM   1. Left ankle sprain, initial encounter    Reviewed x-ray results with patient and husband. Recommend ace wrap to ankle during the day- may take off at night. Keep extremity elevated. Recommend Naproxen twice a day- may take 1/2 tablet with food and watch for stomach irritation. Recommend follow-up with her PCP in 2 to 3 days if not improving.     Katy Apo, NP 07/25/15 [phone]

## 2015-07-30 ENCOUNTER — Emergency Department (HOSPITAL_COMMUNITY): Payer: PPO

## 2015-07-30 ENCOUNTER — Inpatient Hospital Stay (HOSPITAL_COMMUNITY)
Admission: EM | Admit: 2015-07-30 | Discharge: 2015-08-02 | DRG: 242 | Disposition: A | Discharge status: Home Health | Payer: PPO | Attending: Internal Medicine | Admitting: Internal Medicine

## 2015-07-30 ENCOUNTER — Encounter (HOSPITAL_COMMUNITY): Payer: Self-pay | Admitting: Emergency Medicine

## 2015-07-30 DIAGNOSIS — H9190 Unspecified hearing loss, unspecified ear: Secondary | ICD-10-CM | POA: Diagnosis present

## 2015-07-30 DIAGNOSIS — E538 Deficiency of other specified B group vitamins: Secondary | ICD-10-CM | POA: Diagnosis not present

## 2015-07-30 DIAGNOSIS — Z9221 Personal history of antineoplastic chemotherapy: Secondary | ICD-10-CM

## 2015-07-30 DIAGNOSIS — D649 Anemia, unspecified: Secondary | ICD-10-CM | POA: Diagnosis present

## 2015-07-30 DIAGNOSIS — I2510 Atherosclerotic heart disease of native coronary artery without angina pectoris: Secondary | ICD-10-CM | POA: Diagnosis present

## 2015-07-30 DIAGNOSIS — Z90710 Acquired absence of both cervix and uterus: Secondary | ICD-10-CM

## 2015-07-30 DIAGNOSIS — C069 Malignant neoplasm of mouth, unspecified: Secondary | ICD-10-CM | POA: Diagnosis present

## 2015-07-30 DIAGNOSIS — F0390 Unspecified dementia without behavioral disturbance: Secondary | ICD-10-CM | POA: Diagnosis present

## 2015-07-30 DIAGNOSIS — I9589 Other hypotension: Secondary | ICD-10-CM | POA: Diagnosis present

## 2015-07-30 DIAGNOSIS — N182 Chronic kidney disease, stage 2 (mild): Secondary | ICD-10-CM | POA: Diagnosis not present

## 2015-07-30 DIAGNOSIS — E876 Hypokalemia: Secondary | ICD-10-CM | POA: Diagnosis not present

## 2015-07-30 DIAGNOSIS — Z791 Long term (current) use of non-steroidal anti-inflammatories (NSAID): Secondary | ICD-10-CM

## 2015-07-30 DIAGNOSIS — I455 Other specified heart block: Secondary | ICD-10-CM | POA: Diagnosis not present

## 2015-07-30 DIAGNOSIS — Z955 Presence of coronary angioplasty implant and graft: Secondary | ICD-10-CM

## 2015-07-30 DIAGNOSIS — Z888 Allergy status to other drugs, medicaments and biological substances status: Secondary | ICD-10-CM

## 2015-07-30 DIAGNOSIS — L89152 Pressure ulcer of sacral region, stage 2: Secondary | ICD-10-CM | POA: Diagnosis present

## 2015-07-30 DIAGNOSIS — R410 Disorientation, unspecified: Secondary | ICD-10-CM | POA: Diagnosis present

## 2015-07-30 DIAGNOSIS — R270 Ataxia, unspecified: Secondary | ICD-10-CM

## 2015-07-30 DIAGNOSIS — Z85819 Personal history of malignant neoplasm of unspecified site of lip, oral cavity, and pharynx: Secondary | ICD-10-CM

## 2015-07-30 DIAGNOSIS — N179 Acute kidney failure, unspecified: Secondary | ICD-10-CM | POA: Diagnosis present

## 2015-07-30 DIAGNOSIS — J439 Emphysema, unspecified: Secondary | ICD-10-CM | POA: Diagnosis present

## 2015-07-30 DIAGNOSIS — R109 Unspecified abdominal pain: Secondary | ICD-10-CM | POA: Diagnosis not present

## 2015-07-30 DIAGNOSIS — I495 Sick sinus syndrome: Principal | ICD-10-CM | POA: Diagnosis present

## 2015-07-30 DIAGNOSIS — R42 Dizziness and giddiness: Secondary | ICD-10-CM | POA: Diagnosis not present

## 2015-07-30 DIAGNOSIS — K219 Gastro-esophageal reflux disease without esophagitis: Secondary | ICD-10-CM | POA: Diagnosis not present

## 2015-07-30 DIAGNOSIS — R509 Fever, unspecified: Secondary | ICD-10-CM | POA: Diagnosis not present

## 2015-07-30 DIAGNOSIS — F03918 Unspecified dementia, unspecified severity, with other behavioral disturbance: Secondary | ICD-10-CM | POA: Diagnosis present

## 2015-07-30 DIAGNOSIS — J690 Pneumonitis due to inhalation of food and vomit: Secondary | ICD-10-CM | POA: Diagnosis present

## 2015-07-30 DIAGNOSIS — F329 Major depressive disorder, single episode, unspecified: Secondary | ICD-10-CM | POA: Diagnosis not present

## 2015-07-30 DIAGNOSIS — Z79890 Hormone replacement therapy (postmenopausal): Secondary | ICD-10-CM

## 2015-07-30 DIAGNOSIS — K5900 Constipation, unspecified: Secondary | ICD-10-CM | POA: Diagnosis present

## 2015-07-30 DIAGNOSIS — I4891 Unspecified atrial fibrillation: Secondary | ICD-10-CM | POA: Diagnosis not present

## 2015-07-30 DIAGNOSIS — Z681 Body mass index (BMI) 19 or less, adult: Secondary | ICD-10-CM

## 2015-07-30 DIAGNOSIS — E039 Hypothyroidism, unspecified: Secondary | ICD-10-CM | POA: Diagnosis not present

## 2015-07-30 DIAGNOSIS — Z950 Presence of cardiac pacemaker: Secondary | ICD-10-CM

## 2015-07-30 DIAGNOSIS — L8990 Pressure ulcer of unspecified site, unspecified stage: Secondary | ICD-10-CM | POA: Diagnosis not present

## 2015-07-30 DIAGNOSIS — E785 Hyperlipidemia, unspecified: Secondary | ICD-10-CM | POA: Diagnosis not present

## 2015-07-30 DIAGNOSIS — R1310 Dysphagia, unspecified: Secondary | ICD-10-CM

## 2015-07-30 DIAGNOSIS — W19XXXA Unspecified fall, initial encounter: Secondary | ICD-10-CM

## 2015-07-30 DIAGNOSIS — R1319 Other dysphagia: Secondary | ICD-10-CM | POA: Diagnosis not present

## 2015-07-30 DIAGNOSIS — R4701 Aphasia: Secondary | ICD-10-CM | POA: Diagnosis present

## 2015-07-30 DIAGNOSIS — E46 Unspecified protein-calorie malnutrition: Secondary | ICD-10-CM | POA: Diagnosis present

## 2015-07-30 DIAGNOSIS — S3993XA Unspecified injury of pelvis, initial encounter: Secondary | ICD-10-CM | POA: Diagnosis not present

## 2015-07-30 DIAGNOSIS — J189 Pneumonia, unspecified organism: Secondary | ICD-10-CM | POA: Diagnosis not present

## 2015-07-30 DIAGNOSIS — Z9181 History of falling: Secondary | ICD-10-CM

## 2015-07-30 DIAGNOSIS — R001 Bradycardia, unspecified: Secondary | ICD-10-CM | POA: Diagnosis not present

## 2015-07-30 DIAGNOSIS — S3991XA Unspecified injury of abdomen, initial encounter: Secondary | ICD-10-CM | POA: Diagnosis not present

## 2015-07-30 DIAGNOSIS — I252 Old myocardial infarction: Secondary | ICD-10-CM

## 2015-07-30 DIAGNOSIS — R269 Unspecified abnormalities of gait and mobility: Secondary | ICD-10-CM | POA: Diagnosis not present

## 2015-07-30 DIAGNOSIS — F05 Delirium due to known physiological condition: Secondary | ICD-10-CM | POA: Diagnosis not present

## 2015-07-30 DIAGNOSIS — Z923 Personal history of irradiation: Secondary | ICD-10-CM

## 2015-07-30 DIAGNOSIS — Z7982 Long term (current) use of aspirin: Secondary | ICD-10-CM

## 2015-07-30 DIAGNOSIS — Z885 Allergy status to narcotic agent status: Secondary | ICD-10-CM

## 2015-07-30 DIAGNOSIS — I255 Ischemic cardiomyopathy: Secondary | ICD-10-CM | POA: Diagnosis not present

## 2015-07-30 DIAGNOSIS — Z881 Allergy status to other antibiotic agents status: Secondary | ICD-10-CM

## 2015-07-30 DIAGNOSIS — M542 Cervicalgia: Secondary | ICD-10-CM | POA: Diagnosis not present

## 2015-07-30 DIAGNOSIS — Z8249 Family history of ischemic heart disease and other diseases of the circulatory system: Secondary | ICD-10-CM

## 2015-07-30 DIAGNOSIS — F0391 Unspecified dementia with behavioral disturbance: Secondary | ICD-10-CM | POA: Diagnosis present

## 2015-07-30 LAB — CBC WITH DIFFERENTIAL/PLATELET
BASOS ABS: 0 10*3/uL (ref 0.0–0.1)
BASOS PCT: 0 %
EOS ABS: 0 10*3/uL (ref 0.0–0.7)
Eosinophils Relative: 0 %
HCT: 40.1 % (ref 36.0–46.0)
HEMOGLOBIN: 12.9 g/dL (ref 12.0–15.0)
Lymphocytes Relative: 7 %
Lymphs Abs: 0.7 10*3/uL (ref 0.7–4.0)
MCH: 29.1 pg (ref 26.0–34.0)
MCHC: 32.2 g/dL (ref 30.0–36.0)
MCV: 90.3 fL (ref 78.0–100.0)
Monocytes Absolute: 0.4 10*3/uL (ref 0.1–1.0)
Monocytes Relative: 5 %
NEUTROS ABS: 8.6 10*3/uL — AB (ref 1.7–7.7)
NEUTROS PCT: 88 %
Platelets: 231 10*3/uL (ref 150–400)
RBC: 4.44 MIL/uL (ref 3.87–5.11)
RDW: 13.5 % (ref 11.5–15.5)
WBC: 9.8 10*3/uL (ref 4.0–10.5)

## 2015-07-30 LAB — I-STAT CHEM 8, ED
BUN: 24 mg/dL — ABNORMAL HIGH (ref 6–20)
CALCIUM ION: 1.05 mmol/L — AB (ref 1.12–1.23)
Chloride: 103 mmol/L (ref 101–111)
Creatinine, Ser: 1.4 mg/dL — ABNORMAL HIGH (ref 0.44–1.00)
Glucose, Bld: 108 mg/dL — ABNORMAL HIGH (ref 65–99)
HEMATOCRIT: 39 % (ref 36.0–46.0)
HEMOGLOBIN: 13.3 g/dL (ref 12.0–15.0)
Potassium: 4 mmol/L (ref 3.5–5.1)
SODIUM: 134 mmol/L — AB (ref 135–145)
TCO2: 25 mmol/L (ref 0–100)

## 2015-07-30 LAB — I-STAT TROPONIN, ED: Troponin i, poc: 0 ng/mL (ref 0.00–0.08)

## 2015-07-30 LAB — COMPREHENSIVE METABOLIC PANEL
ALBUMIN: 3.6 g/dL (ref 3.5–5.0)
ALK PHOS: 72 U/L (ref 38–126)
ALT: 15 U/L (ref 14–54)
ANION GAP: 9 (ref 5–15)
AST: 24 U/L (ref 15–41)
BUN: 21 mg/dL — AB (ref 6–20)
CALCIUM: 9.1 mg/dL (ref 8.9–10.3)
CO2: 24 mmol/L (ref 22–32)
Chloride: 101 mmol/L (ref 101–111)
Creatinine, Ser: 1.33 mg/dL — ABNORMAL HIGH (ref 0.44–1.00)
GFR calc Af Amer: 46 mL/min — ABNORMAL LOW (ref >60)
GFR calc non Af Amer: 39 mL/min — ABNORMAL LOW (ref >60)
GLUCOSE: 115 mg/dL — AB (ref 65–99)
Potassium: 4 mmol/L (ref 3.5–5.1)
SODIUM: 134 mmol/L — AB (ref 135–145)
Total Bilirubin: 0.6 mg/dL (ref 0.3–1.2)
Total Protein: 7.2 g/dL (ref 6.5–8.1)

## 2015-07-30 LAB — I-STAT CG4 LACTIC ACID, ED: Lactic Acid, Venous: 1.13 mmol/L (ref 0.5–1.9)

## 2015-07-30 LAB — PROTIME-INR
INR: 1.22 (ref 0.00–1.49)
Prothrombin Time: 15.6 seconds — ABNORMAL HIGH (ref 11.6–15.2)

## 2015-07-30 LAB — APTT: aPTT: 29 seconds (ref 24–37)

## 2015-07-30 MED ORDER — ENOXAPARIN SODIUM 30 MG/0.3ML ~~LOC~~ SOLN
30.0000 mg | Freq: Every day | SUBCUTANEOUS | Status: DC
  Administered 2015-07-30: 30 mg via SUBCUTANEOUS
  Filled 2015-07-30: qty 0.3

## 2015-07-30 MED ORDER — SENNOSIDES-DOCUSATE SODIUM 8.6-50 MG PO TABS: 1.0000 | ORAL_TABLET | Freq: Every evening | ORAL | Status: DC | PRN

## 2015-07-30 MED ORDER — MIRTAZAPINE 15 MG PO TABS
30.0000 mg | ORAL_TABLET | Freq: Every day | ORAL | Status: DC
  Administered 2015-07-31 – 2015-08-01 (×2): 30 mg via ORAL
  Filled 2015-07-30 (×2): qty 2

## 2015-07-30 MED ORDER — ASPIRIN 300 MG RE SUPP
300.0000 mg | Freq: Every day | RECTAL | Status: DC
  Administered 2015-07-31: 300 mg via RECTAL
  Filled 2015-07-30: qty 1

## 2015-07-30 MED ORDER — FOLIC ACID 1 MG PO TABS
1.0000 mg | ORAL_TABLET | Freq: Every day | ORAL | Status: DC
  Administered 2015-07-31 – 2015-08-02 (×3): 1 mg via ORAL
  Filled 2015-07-30 (×3): qty 1

## 2015-07-30 MED ORDER — NITROGLYCERIN 0.4 MG SL SUBL: 0.4000 mg | SUBLINGUAL_TABLET | SUBLINGUAL | Status: DC | PRN

## 2015-07-30 MED ORDER — TRAMADOL HCL 50 MG PO TABS
50.0000 mg | ORAL_TABLET | Freq: Three times a day (TID) | ORAL | Status: DC | PRN
  Filled 2015-07-30: qty 1

## 2015-07-30 MED ORDER — ASPIRIN 325 MG PO TABS
325.0000 mg | ORAL_TABLET | Freq: Every day | ORAL | Status: DC
  Administered 2015-08-01 – 2015-08-02 (×2): 325 mg via ORAL
  Filled 2015-07-30 (×2): qty 1

## 2015-07-30 MED ORDER — SERTRALINE HCL 100 MG PO TABS
100.0000 mg | ORAL_TABLET | Freq: Every day | ORAL | Status: DC
  Administered 2015-07-31 – 2015-08-02 (×3): 100 mg via ORAL
  Filled 2015-07-30 (×3): qty 1

## 2015-07-30 MED ORDER — VITAMIN B-12 1000 MCG PO TABS: 1000.0000 ug | ORAL_TABLET | Freq: Every day | ORAL | Status: DC

## 2015-07-30 MED ORDER — LEVOFLOXACIN IN D5W 750 MG/150ML IV SOLN
750.0000 mg | Freq: Once | INTRAVENOUS | Status: AC
Start: 2015-07-30 — End: 2015-07-30
  Administered 2015-07-30: 750 mg via INTRAVENOUS
  Filled 2015-07-30: qty 150

## 2015-07-30 MED ORDER — ROSUVASTATIN CALCIUM 5 MG PO TABS
10.0000 mg | ORAL_TABLET | Freq: Every day | ORAL | Status: DC
  Administered 2015-08-01: 10 mg via ORAL
  Filled 2015-07-30: qty 2

## 2015-07-30 MED ORDER — LEVOFLOXACIN IN D5W 500 MG/100ML IV SOLN
500.0000 mg | INTRAVENOUS | Status: DC
  Administered 2015-08-01: 500 mg via INTRAVENOUS
  Filled 2015-07-30: qty 100

## 2015-07-30 MED ORDER — IOPAMIDOL (ISOVUE-300) INJECTION 61%
INTRAVENOUS | Status: AC
  Administered 2015-07-30: 50 mL
  Filled 2015-07-30: qty 50

## 2015-07-30 MED ORDER — LORAZEPAM 0.5 MG PO TABS: 0.5000 mg | ORAL_TABLET | Freq: Two times a day (BID) | ORAL | Status: DC | PRN

## 2015-07-30 MED ORDER — LEVOFLOXACIN IN D5W 750 MG/150ML IV SOLN: 750.0000 mg | INTRAVENOUS | Status: DC

## 2015-07-30 MED ORDER — SODIUM CHLORIDE 0.9 % IV SOLN: INTRAVENOUS | Status: DC

## 2015-07-30 MED ORDER — MEGESTROL ACETATE 400 MG/10ML PO SUSP
400.0000 mg | Freq: Every day | ORAL | Status: DC
  Administered 2015-07-31 – 2015-08-02 (×3): 400 mg via ORAL
  Filled 2015-07-30 (×3): qty 10

## 2015-07-30 MED ORDER — WHITE PETROLATUM GEL
Status: AC
  Administered 2015-07-30: 1
  Filled 2015-07-30: qty 1

## 2015-07-30 MED ORDER — SODIUM CHLORIDE 0.9 % IV BOLUS (SEPSIS)
500.0000 mL | Freq: Once | INTRAVENOUS | Status: AC
  Administered 2015-07-30: 500 mL via INTRAVENOUS

## 2015-07-30 MED ORDER — PANTOPRAZOLE SODIUM 40 MG PO TBEC
40.0000 mg | DELAYED_RELEASE_TABLET | Freq: Every day | ORAL | Status: DC
  Administered 2015-08-01 – 2015-08-02 (×2): 40 mg via ORAL
  Filled 2015-07-30 (×4): qty 1

## 2015-07-30 MED ORDER — SODIUM CHLORIDE 0.9 % IV SOLN
INTRAVENOUS | Status: DC
  Administered 2015-07-30: 23:00:00 via INTRAVENOUS
  Administered 2015-07-31: 250 mL via INTRAVENOUS

## 2015-07-30 NOTE — H&P (Addendum)
History and Physical    Pamela Kidd [id number] DOB: April 14, 1945 DOA: 07/30/2015  PCP: [name], MD  Patient coming from: Home.  History obtained from ER physician as patient has dementia and patient's husband is not reachable at this time.  Chief Complaint: Difficulty walking and speech difficulties.  HPI: Pamela Kidd is a 70 y.o. 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 Patient has been having these symptoms for last 24 hours. CT of the head and soft neck tissue was unremarkable. Patient is mildly febrile and chest x-ray shows possible pneumonia. On my exam patient still has difficulty bringing out words but is able to move all extremities.. Complains of left flank pain after a fall. Patient does not recall exactly when she fell. Patient will be admitted for pneumonia and further workup on ataxia and difficulty speaking. Patient is hard of hearing.  ED Course: Patient was started on Levaquin for pneumonia.  Review of Systems: As per HPI, rest all negative.   Past Medical History  Diagnosis Date  . Ischemic cardiomyopathy     a. 08/27/2011 Echo: EF 35-40%, mid-dist anteroseptal/apical akinesis, Gr 1 DD, mild-mod TR, PASP 57mmHg  . Hypotension     a. preventing use of ACEI  . Diverticulosis   . Hyperlipidemia     takes Crestor nightly  . Asthmatic bronchitis     "as a child; haven't had any since ~ I was 29 yr old" (06/08/2012)  . Anemia     "I've always been anemic; never had a transfusion" (06/08/2012)  . H/O hiatal hernia   . Oral cancer ([place]) 2003    a. s/p radiation/chemotherapy  . Emphysema of lung ([place])   . GERD (gastroesophageal reflux disease)     takes Protonix daily  . Constipation     takes Miralax daily prn  . CAD (coronary artery  disease)     a. 08/26/2011 Acute Ant STEMI/Cath/PCI: LM nl, LAD 100p -> 2.75x23 Xience Xpedition, LCX 20, minor irregs, RCA 20-73mid, EF 30-35%, anterior/apical AK;  b. 05/2012 Cath: LM nl, LAD patent LAD stent, LCX 30ost, RCA dom, nl, EF 65%.  . Complication of anesthesia     slow to wake up  . Vertigo     occasionally  . Bruises easily     d/t taking Plavix and ASA  . History of colon polyps   . Dry mouth   . PONV (postoperative nausea and vomiting)   . STEMI (ST elevation myocardial infarction) ([place]) 08/2011    anterior/notes 09/07/2011 (06/08/2012)  . Gastritis   . Dementia   . Osteoporosis     Past Surgical History  Procedure Laterality Date  . Bunionectomy Bilateral 1970's  . Shoulder open rotator cuff repair Left 1990's  . Tonsillectomy  ~ 1951  . Vaginal hysterectomy  ~ 1989    partial  . Tubal ligation  1975  . Coronary angioplasty with stent placement  08/2011    "1" (06/08/2012)  . Cardiac catheterization  06/09/2012    patent prox LAD stent, minor nonobstructive LAD disease, 30% ostial LCx, and otherwise widely patent cors; LVEF 65%  . Colonoscopy    . Peg placement      in 2003 and then removed  . Cholecystectomy  08/18/2012  . Cholecystectomy N/A 08/18/2012    Procedure: LAPAROSCOPIC CHOLECYSTECTOMY WITH INTRAOPERATIVE  CHOLANGIOGRAM;  Surgeon: Imogene Burn. Georgette Dover, MD;  Location: [place];  Service: General;  Laterality: N/A;  . Left heart catheterization with coronary angiogram N/A 08/26/2011    Procedure: LEFT HEART CATHETERIZATION WITH CORONARY ANGIOGRAM;  Surgeon: Peter M Martinique, MD;  Location: [facility] CATH LAB;  Service: Cardiovascular;  Laterality: N/A;  . Percutaneous coronary stent intervention (pci-s) N/A 08/26/2011    Procedure: PERCUTANEOUS CORONARY STENT INTERVENTION (PCI-S);  Surgeon: Peter M Martinique, MD;  Location: [facility] CATH LAB;  Service: Cardiovascular;  Laterality: N/A;  . Left heart catheterization with coronary angiogram N/A 06/09/2012    Procedure: LEFT HEART  CATHETERIZATION WITH CORONARY ANGIOGRAM;  Surgeon: Sherren Mocha, MD;  Location: [facility] CATH LAB;  Service: Cardiovascular;  Laterality: N/A;     reports that she has never smoked. She has never used smokeless tobacco. She reports that she does not drink alcohol or use illicit drugs.  Allergies  Allergen Reactions  . Ampicillin Itching and Dermatitis  . Cisapride     Other reaction(s): Other (See Comments) Unknown  . Codeine Itching and Nausea And Vomiting    Other reaction(s): GI Upset (intolerance)  . Nubain [Nalbuphine Hcl] Other (See Comments)    "headache & disoriented"      Family History  Problem Relation Age of Onset  . Dementia Mother   . Heart disease Father   . Cancer Father     prostate  . Heart disease Paternal Grandfather   . Colon cancer Neg Hx     Prior to Admission medications   Medication Sig Start Date End Date Taking? Authorizing Provider  aspirin EC 81 MG tablet Take 1 tablet (81 mg total) by mouth daily. 09/08/12   Peter M Martinique, MD  folic acid (FOLVITE) 1 MG tablet Take 1 mg by mouth daily.    Historical Provider, MD  hydrOXYzine (ATARAX/VISTARIL) 10 MG tablet Take 2.5 tablets (25 mg total) by mouth 3 (three) times daily. 06/04/15   Konrad Felix, PA  LORazepam (ATIVAN) 0.5 MG tablet Take 1 tablet (0.5 mg total) by mouth 2 (two) times daily as needed for anxiety (panic attack). 04/26/14   Susy Frizzle, MD  megestrol (MEGACE) 400 MG/10ML suspension Take 10 mLs (400 mg total) by mouth daily. 02/05/15   Susy Frizzle, MD  mirtazapine (REMERON) 30 MG tablet TAKE ONE TABLET BY MOUTH AT BEDTIME 07/19/15   Susy Frizzle, MD  naproxen (NAPROSYN) 500 MG tablet Take 1 tablet (500 mg total) by mouth 2 (two) times daily. 07/24/15   Katy Apo, NP  nitroGLYCERIN (NITROSTAT) 0.4 MG SL tablet Place 1 tablet (0.4 mg total) under the tongue every 5 (five) minutes x 3 doses as needed for chest pain. 03/15/15 06/04/17  Peter M Martinique, MD  pantoprazole (PROTONIX) 40 MG  tablet Take 1 tablet (40 mg total) by mouth daily before breakfast. 05/02/15   Milus Banister, MD  rosuvastatin (CRESTOR) 10 MG tablet Take 1 tablet (10 mg total) by mouth daily. 03/15/15   Peter M Martinique, MD  sertraline (ZOLOFT) 100 MG tablet Take 1 tablet by mouth daily. 01/09/15   Historical Provider, MD  traMADol (ULTRAM) 50 MG tablet Take 1 tablet (50 mg total) by mouth every 8 (eight) hours as needed. 05/15/14   Alycia Rossetti, MD  vitamin B-12 (CYANOCOBALAMIN) 1000 MCG tablet Take 1,000 mcg by mouth daily.    Historical Provider, MD    Physical Exam: Filed Vitals:   07/30/15 1954 07/30/15 2045 07/30/15 2156 07/30/15 2248  BP: 102/64 96/68 102/62 102/68  Pulse: 66 67 67 68  Temp:    98.4 F (36.9 C)  TempSrc:    Oral  Resp: 16 17 16 16   Height:      Weight:    96 lb 12.8 oz (43.908 kg)  SpO2: 100% 10% 99% 100%      Constitutional: Not in distress. Filed Vitals:   07/30/15 1954 07/30/15 2045 07/30/15 2156 07/30/15 2248  BP: 102/64 96/68 102/62 102/68  Pulse: 66 67 67 68  Temp:    98.4 F (36.9 C)  TempSrc:    Oral  Resp: 16 17 16 16   Height:      Weight:    96 lb 12.8 oz (43.908 kg)  SpO2: 100% 10% 99% 100%   Eyes: Anicteric no pallor. ENMT: No discharge from the ears eyes nose or mouth. Neck: No mass felt. No neck rigidity. Respiratory: No rhonchi or crepitations. Cardiovascular: S1-S2 heard. Abdomen: Soft nontender bowel sounds present. Musculoskeletal: No edema. Skin: No rash. Neurologic: Alert awake oriented to her name and moves all extremities 5 x 5. No facial asymmetry tongue is midline. Has difficulty speaking. Perla positive. Psychiatric: Has dementia.   Labs on Admission: I have personally reviewed following labs and imaging studies  CBC:  Recent Labs Lab 07/30/15 1435 07/30/15 1451  WBC  --  9.8  NEUTROABS  --  8.6*  HGB 13.3 12.9  HCT 39.0 40.1  MCV  --  90.3  PLT  --  [id number]   Basic Metabolic Panel:  Recent Labs Lab 07/30/15 1435  07/30/15 1451  NA 134* 134*  K 4.0 4.0  CL 103 101  CO2  --  24  GLUCOSE 108* 115*  BUN 24* 21*  CREATININE 1.40* 1.33*  CALCIUM  --  9.1   GFR: Estimated Creatinine Clearance: 25.4 mL/min (by C-G formula based on Cr of 1.33). Liver Function Tests:  Recent Labs Lab 07/30/15 1451  AST 24  ALT 15  ALKPHOS 72  BILITOT 0.6  PROT 7.2  ALBUMIN 3.6   No results for input(s): LIPASE, AMYLASE in the last 168 hours. No results for input(s): AMMONIA in the last 168 hours. Coagulation Profile:  Recent Labs Lab 07/30/15 1451  INR 1.22   Cardiac Enzymes: No results for input(s): CKTOTAL, CKMB, CKMBINDEX, TROPONINI in the last 168 hours. BNP (last 3 results) No results for input(s): PROBNP in the last 8760 hours. HbA1C: No results for input(s): HGBA1C in the last 72 hours. CBG: No results for input(s): GLUCAP in the last 168 hours. Lipid Profile: No results for input(s): CHOL, HDL, LDLCALC, TRIG, CHOLHDL, LDLDIRECT in the last 72 hours. Thyroid Function Tests: No results for input(s): TSH, T4TOTAL, FREET4, T3FREE, THYROIDAB in the last 72 hours. Anemia Panel: No results for input(s): VITAMINB12, FOLATE, FERRITIN, TIBC, IRON, RETICCTPCT in the last 72 hours. Urine analysis:    Component Value Date/Time   COLORURINE YELLOW 07/22/2010 1609   APPEARANCEUR CLEAR 07/22/2010 1609   LABSPEC 1.018 07/22/2010 1609   PHURINE 6.0 07/22/2010 1609   GLUCOSEU NEGATIVE 07/22/2010 1609   HGBUR NEGATIVE 07/22/2010 1609   BILIRUBINUR NEGATIVE 07/22/2010 1609   KETONESUR 15* 07/22/2010 1609   PROTEINUR NEGATIVE 07/22/2010 1609   UROBILINOGEN 0.2 07/22/2010 1609   NITRITE NEGATIVE 07/22/2010 1609   LEUKOCYTESUR NEGATIVE 07/22/2010 1609   Sepsis Labs: @LABRCNTIP (procalcitonin:4,lacticidven:4) )No results found for this or any previous visit (from the past 240 hour(s)).   Radiological Exams on Admission: Dg Chest 2 View  07/30/2015  CLINICAL DATA:  Fever today EXAM: CHEST  2 VIEW  COMPARISON:  04/14/2014 FINDINGS: Cardiomediastinal silhouette is stable. Mild hyperinflation. No pleural effusion. There is left base posterior retrocardiac nodular consolidation best seen on lateral view. Pneumonia cannot be excluded. Follow-up to resolution is recommended. No pulmonary edema. Mild levoscoliosis lower thoracic spine. Thoracic spine osteopenia. IMPRESSION: Mild hyperinflation. No pleural effusion. There is left base posterior retrocardiac nodular consolidation best seen on lateral view. Pneumonia cannot be excluded. Follow-up to resolution is recommended. Electronically Signed   By: Lahoma Crocker M.D.   On: 07/30/2015 19:25   Ct Head Wo Contrast  07/30/2015  CLINICAL DATA:  Slurred speech. Unsteady gait. Dizziness. Symptoms began yesterday. Patient has dementia. EXAM: CT HEAD WITHOUT CONTRAST TECHNIQUE: Contiguous axial images were obtained from the base of the skull through the vertex without intravenous contrast. COMPARISON:  12/23/2012 FINDINGS: The brain shows generalized atrophy. There is no evidence of old or acute small or large vessel infarction. No mass lesion, hemorrhage, hydrocephalus or extra-axial collection. Calvarium is unremarkable. Sinuses, middle ears and mastoids are clear. IMPRESSION: Atrophy without focal or acute finding. Electronically Signed   By: Nelson Chimes M.D.   On: 07/30/2015 16:08   Ct Soft Tissue Neck W Contrast  07/30/2015  CLINICAL DATA:  Fever.  Mouth and neck pain. EXAM: CT NECK WITH CONTRAST TECHNIQUE: Multidetector CT imaging of the neck was performed using the standard protocol following the bolus administration of intravenous contrast. CONTRAST:  55mL ISOVUE-300 IOPAMIDOL (ISOVUE-300) INJECTION 61% COMPARISON:  None. FINDINGS: Pharynx and larynx: Normal pharynx. Tongue is normal. No evidence of mass or abscess in the pharynx. Atrophic tonsils. Epiglottis and larynx normal. Salivary glands: Atrophic submandibular gland bilaterally. Atrophic parotid gland  bilaterally. Thyroid: Negative Lymph nodes: No pathologic adenopathy in the neck. Vascular: Mild carotid artery calcification bilaterally. Carotid artery and jugular vein patent bilaterally. Limited intracranial: Negative Visualized orbits: Negative Mastoids and visualized paranasal sinuses: Negative Skeleton: Mild to moderate cervical degenerative change with disc and facet degeneration. No fracture or bone lesion. Patient is nearly edentulous. Upper anterior teeth show caries. No periapical abscess. Upper chest: Apical scarring bilaterally. IMPRESSION: Negative for abscess in the neck. No mass or adenopathy Dental caries upper anterior teeth without periapical abscess. Electronically Signed   By: Franchot Gallo M.D.   On: 07/30/2015 20:23    EKG: Independently reviewed. Normal sinus rhythm low voltage.  Assessment/Plan Principal Problem:   Ataxia Active Problems:   Ischemic cardiomyopathy   CAD (coronary artery disease), native coronary artery   Oral cancer (HCC)   Dementia   CAP (community acquired pneumonia)    1. Ataxia and difficulty speaking - I have discussed with on-call neurologist Dr. Nicole Kindred. Dr. Nicole Kindred has advised to get MRI brain and if positive then further stroke further workup. 2. Community acquired pneumonia versus aspiration - speech therapist consult has been requested. Patient is placed on Levaquin and Flagyl for possible community-acquired pneumonia and possible aspiration. Check urine Legionella and strep antigen. 3. CAD status post stenting - continue aspirin and statins. 4. Acute renal failure on chronic kidney disease stage II- gentle hydration and closely follow metabolic panel. Levaquin dosing per pharmacy. 5. History of dementia and depression - being followed up at [facility]. Continue present medications. Patient also is on B-12 supplementation. 6. History of oral cancer status post chemoradiation in 2003 - CT soft neck is unremarkable.  Patient is  complaining of left flank pain. Since patient had recent fall CT abdomen and  X Ray pelvis has been ordered.   DVT prophylaxis: Lovenox. Code Status: Full code.  Family Communication: Unable to reach family.  Disposition Plan: Home.  Consults called: Did discuss with neurology.  Admission status: Observation. Telemetry.    Rise Patience MD [facility] Pager [phone].  If 7PM-7AM, please contact night-coverage [email or web address] Password [facility]  07/30/2015, 10:51 PM

## 2015-07-30 NOTE — ED Notes (Signed)
Per husband pt started having a unsteady gait last night and notice her speech sounded a "little off". pts speech does sound garbled. Pt has equal grip strengths and intact sensation. Pt is alert and oriented to person and place.

## 2015-07-30 NOTE — Progress Notes (Signed)
Pt transferred to unit from ED via NT x 1. No complaints of pain or discomfort. No signs or symptoms of acute distress. Pt alert and oriented upon arrival. Pt connected to telemetry and central monitoring notified. Pt oriented to the unit, as well as unit procedures. Pt now resting in bed at lowest position, bed alarm on, call light in reach. Will continue to monitor. Fortino Sic, RN, BSN 07/31/2015 12:22 AM

## 2015-07-30 NOTE — ED Provider Notes (Signed)
CSN: [id number]     Arrival date & time 07/30/15  1416 History   First MD Initiated Contact with Patient 07/30/15 1820     Chief Complaint  Patient presents with  . Aphasia     (Consider location/radiation/quality/duration/timing/severity/associated sxs/prior Treatment) HPI Patient presents with garbled speech, lethargy and generalized weakness starting yesterday evening. Patient has history of oral cancer status post radiation. Per husband patient has difficulty swallowing and is only drinking Dr Malachi Bonds and [facility] processes. Has multiple coughing and choking episodes. Denies any difficulty breathing. No chest pain. No nausea, vomiting or diarrhea. Denies abdominal pain or urinary symptoms. Denies fever or chills. Patient does have a history of dementia and history is somewhat limited. Husband also states patient has had gait instability and occasional syncopal episodes for the last few months associated with dizziness. Past Medical History  Diagnosis Date  . Ischemic cardiomyopathy     a. 08/27/2011 Echo: EF 35-40%, mid-dist anteroseptal/apical akinesis, Gr 1 DD, mild-mod TR, PASP 58mmHg  . Hypotension     a. preventing use of ACEI  . Diverticulosis   . Hyperlipidemia     takes Crestor nightly  . Asthmatic bronchitis     "as a child; haven't had any since ~ I was 3 yr old" (06/08/2012)  . Anemia     "I've always been anemic; never had a transfusion" (06/08/2012)  . H/O hiatal hernia   . 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 s/p radiation/chemotherapy  . Emphysema of lung ([place])   . GERD (gastroesophageal reflux disease)     takes Protonix daily  . Constipation     takes Miralax daily prn  . CAD (coronary artery disease)     a. 08/26/2011 Acute Ant STEMI/Cath/PCI: LM nl, LAD 100p -> 2.75x23 Xience Xpedition, LCX 20, minor irregs, RCA 20-55mid, EF 30-35%, anterior/apical AK;  b. 05/2012 Cath: LM nl, LAD patent LAD stent, LCX 30ost, RCA dom, nl, EF 65%.  . Complication of anesthesia     slow to  wake up  . Vertigo     occasionally  . Bruises easily     d/t taking Plavix and ASA  . History of colon polyps   . Dry mouth   . PONV (postoperative nausea and vomiting)   . STEMI (ST elevation myocardial infarction) ([place]) 08/2011    anterior/notes 09/07/2011 (06/08/2012)  . Gastritis   . Dementia   . Osteoporosis    Past Surgical History  Procedure Laterality Date  . Bunionectomy Bilateral 1970's  . Shoulder open rotator cuff repair Left 1990's  . Tonsillectomy  ~ 1951  . Vaginal hysterectomy  ~ 1989    partial  . Tubal ligation  1975  . Coronary angioplasty with stent placement  08/2011    "1" (06/08/2012)  . Cardiac catheterization  06/09/2012    patent prox LAD stent, minor nonobstructive LAD disease, 30% ostial LCx, and otherwise widely patent cors; LVEF 65%  . Colonoscopy    . Peg placement      in 2003 and then removed  . Cholecystectomy  08/18/2012  . Cholecystectomy N/A 08/18/2012    Procedure: LAPAROSCOPIC CHOLECYSTECTOMY WITH INTRAOPERATIVE CHOLANGIOGRAM;  Surgeon: Imogene Burn. Georgette Dover, MD;  Location: [facility];  Service: General;  Laterality: N/A;  . Left heart catheterization with coronary angiogram N/A 08/26/2011    Procedure: LEFT HEART CATHETERIZATION WITH CORONARY ANGIOGRAM;  Surgeon: Peter M Martinique, MD;  Location: [facility] CATH LAB;  Service: Cardiovascular;  Laterality: N/A;  . Percutaneous coronary stent intervention (  pci-s) N/A 08/26/2011    Procedure: PERCUTANEOUS CORONARY STENT INTERVENTION (PCI-S);  Surgeon: Peter M Martinique, MD;  Location: [facility] CATH LAB;  Service: Cardiovascular;  Laterality: N/A;  . Left heart catheterization with coronary angiogram N/A 06/09/2012    Procedure: LEFT HEART CATHETERIZATION WITH CORONARY ANGIOGRAM;  Surgeon: Sherren Mocha, MD;  Location: [facility] CATH LAB;  Service: Cardiovascular;  Laterality: N/A;   Family History  Problem Relation Age of Onset  . Dementia Mother   . Heart disease Father   . Cancer Father     prostate  . Heart disease Paternal  Grandfather   . Colon cancer Neg Hx    Social History  Substance Use Topics  . Smoking status: Never Smoker   . Smokeless tobacco: Never Used  . Alcohol Use: No   OB History    No data available     Review of Systems  Constitutional: Positive for activity change and fatigue. Negative for fever and chills.  HENT: Positive for trouble swallowing. Negative for congestion and sore throat.   Eyes: Negative for visual disturbance.  Respiratory: Positive for choking. Negative for shortness of breath.   Gastrointestinal: Negative for nausea, vomiting, abdominal pain, diarrhea and constipation.  Genitourinary: Negative for dysuria, frequency, hematuria and flank pain.  Musculoskeletal: Positive for gait problem. Negative for back pain and neck pain.  Skin: Negative for rash and wound.  Neurological: Positive for dizziness, syncope, weakness (generalized) and light-headedness. Negative for numbness and headaches.  All other systems reviewed and are negative.     Allergies  Ampicillin; Cisapride; Codeine; and Nubain  Home Medications   Prior to Admission medications   Medication Sig Start Date End Date Taking? Authorizing Provider  aspirin EC 81 MG tablet Take 1 tablet (81 mg total) by mouth daily. 09/08/12   Peter M Martinique, MD  folic acid (FOLVITE) 1 MG tablet Take 1 mg by mouth daily.    Historical Provider, MD  hydrOXYzine (ATARAX/VISTARIL) 10 MG tablet Take 2.5 tablets (25 mg total) by mouth 3 (three) times daily. 06/04/15   Konrad Felix, PA  LORazepam (ATIVAN) 0.5 MG tablet Take 1 tablet (0.5 mg total) by mouth 2 (two) times daily as needed for anxiety (panic attack). 04/26/14   Susy Frizzle, MD  megestrol (MEGACE) 400 MG/10ML suspension Take 10 mLs (400 mg total) by mouth daily. 02/05/15   Susy Frizzle, MD  mirtazapine (REMERON) 30 MG tablet TAKE ONE TABLET BY MOUTH AT BEDTIME 07/19/15   Susy Frizzle, MD  naproxen (NAPROSYN) 500 MG tablet Take 1 tablet (500 mg total) by  mouth 2 (two) times daily. 07/24/15   Katy Apo, NP  nitroGLYCERIN (NITROSTAT) 0.4 MG SL tablet Place 1 tablet (0.4 mg total) under the tongue every 5 (five) minutes x 3 doses as needed for chest pain. 03/15/15 06/04/17  Peter M Martinique, MD  pantoprazole (PROTONIX) 40 MG tablet Take 1 tablet (40 mg total) by mouth daily before breakfast. 05/02/15   Milus Banister, MD  rosuvastatin (CRESTOR) 10 MG tablet Take 1 tablet (10 mg total) by mouth daily. 03/15/15   Peter M Martinique, MD  sertraline (ZOLOFT) 100 MG tablet Take 1 tablet by mouth daily. 01/09/15   Historical Provider, MD  traMADol (ULTRAM) 50 MG tablet Take 1 tablet (50 mg total) by mouth every 8 (eight) hours as needed. 05/15/14   Alycia Rossetti, MD  vitamin B-12 (CYANOCOBALAMIN) 1000 MCG tablet Take 1,000 mcg by mouth daily.    Historical Provider,  MD   BP 96/68 mmHg  Pulse 67  Temp(Src) 100.7 F (38.2 C) (Oral)  Resp 17  Ht 4\' 10"  (1.473 m)  Wt 80 lb (36.288 kg)  BMI 16.72 kg/m2  SpO2 10% Physical Exam  Constitutional: She is oriented to person, place, and time. She appears well-developed and well-nourished. No distress.  HENT:  Head: Normocephalic and atraumatic.  Mouth/Throat: Oropharynx is clear and moist. No oropharyngeal exudate.  No intraoral masses appreciated. Patient is mostly edentulous  Eyes: EOM are normal. Pupils are equal, round, and reactive to light.  Neck: Normal range of motion. Neck supple.  Cardiovascular: Normal rate and regular rhythm.  Exam reveals no gallop and no friction rub.   No murmur heard. Pulmonary/Chest: Effort normal and breath sounds normal. No stridor. No respiratory distress. She has no wheezes. She has no rales. She exhibits no tenderness.  Abdominal: Soft. Bowel sounds are normal. She exhibits no distension and no mass. There is no tenderness. There is no rebound and no guarding.  Musculoskeletal: Normal range of motion. She exhibits no edema or tenderness.  Neurological: She is alert and  oriented to person, place, and time.  Patient with garbled voice. Cranial nerves II through XII intact. 5/5 motor in all extremities. Sensation is fully intact. Bilateral finger to nose intact.  Skin: Skin is warm and dry. No rash noted. No erythema.  Psychiatric: She has a normal mood and affect. Her behavior is normal.  Nursing note and vitals reviewed.   ED Course  Procedures (including critical care time) Labs Review Labs Reviewed  CBC WITH DIFFERENTIAL/PLATELET - Abnormal; Notable for the following:    Neutro Abs 8.6 (*)    All other components within normal limits  COMPREHENSIVE METABOLIC PANEL - Abnormal; Notable for the following:    Sodium 134 (*)    Glucose, Bld 115 (*)    BUN 21 (*)    Creatinine, Ser 1.33 (*)    GFR calc non Af Amer 39 (*)    GFR calc Af Amer 46 (*)    All other components within normal limits  PROTIME-INR - Abnormal; Notable for the following:    Prothrombin Time 15.6 (*)    All other components within normal limits  I-STAT CHEM 8, ED - Abnormal; Notable for the following:    Sodium 134 (*)    BUN 24 (*)    Creatinine, Ser 1.40 (*)    Glucose, Bld 108 (*)    Calcium, Ion 1.05 (*)    All other components within normal limits  CULTURE, BLOOD (ROUTINE X 2)  CULTURE, BLOOD (ROUTINE X 2)  APTT  I-STAT TROPOININ, ED  I-STAT CG4 LACTIC ACID, ED    Imaging Review Dg Chest 2 View  07/30/2015  CLINICAL DATA:  Fever today EXAM: CHEST  2 VIEW COMPARISON:  04/14/2014 FINDINGS: Cardiomediastinal silhouette is stable. Mild hyperinflation. No pleural effusion. There is left base posterior retrocardiac nodular consolidation best seen on lateral view. Pneumonia cannot be excluded. Follow-up to resolution is recommended. No pulmonary edema. Mild levoscoliosis lower thoracic spine. Thoracic spine osteopenia. IMPRESSION: Mild hyperinflation. No pleural effusion. There is left base posterior retrocardiac nodular consolidation best seen on lateral view. Pneumonia  cannot be excluded. Follow-up to resolution is recommended. Electronically Signed   By: Lahoma Crocker M.D.   On: 07/30/2015 19:25   Ct Head Wo Contrast  07/30/2015  CLINICAL DATA:  Slurred speech. Unsteady gait. Dizziness. Symptoms began yesterday. Patient has dementia. EXAM: CT HEAD WITHOUT CONTRAST TECHNIQUE:  Contiguous axial images were obtained from the base of the skull through the vertex without intravenous contrast. COMPARISON:  12/23/2012 FINDINGS: The brain shows generalized atrophy. There is no evidence of old or acute small or large vessel infarction. No mass lesion, hemorrhage, hydrocephalus or extra-axial collection. Calvarium is unremarkable. Sinuses, middle ears and mastoids are clear. IMPRESSION: Atrophy without focal or acute finding. Electronically Signed   By: Nelson Chimes M.D.   On: 07/30/2015 16:08   Ct Soft Tissue Neck W Contrast  07/30/2015  CLINICAL DATA:  Fever.  Mouth and neck pain. EXAM: CT NECK WITH CONTRAST TECHNIQUE: Multidetector CT imaging of the neck was performed using the standard protocol following the bolus administration of intravenous contrast. CONTRAST:  52mL ISOVUE-300 IOPAMIDOL (ISOVUE-300) INJECTION 61% COMPARISON:  None. FINDINGS: Pharynx and larynx: Normal pharynx. Tongue is normal. No evidence of mass or abscess in the pharynx. Atrophic tonsils. Epiglottis and larynx normal. Salivary glands: Atrophic submandibular gland bilaterally. Atrophic parotid gland bilaterally. Thyroid: Negative Lymph nodes: No pathologic adenopathy in the neck. Vascular: Mild carotid artery calcification bilaterally. Carotid artery and jugular vein patent bilaterally. Limited intracranial: Negative Visualized orbits: Negative Mastoids and visualized paranasal sinuses: Negative Skeleton: Mild to moderate cervical degenerative change with disc and facet degeneration. No fracture or bone lesion. Patient is nearly edentulous. Upper anterior teeth show caries. No periapical abscess. Upper chest:  Apical scarring bilaterally. IMPRESSION: Negative for abscess in the neck. No mass or adenopathy Dental caries upper anterior teeth without periapical abscess. Electronically Signed   By: Franchot Gallo M.D.   On: 07/30/2015 20:23   I have personally reviewed and evaluated these images and lab results as part of my medical decision-making.   EKG Interpretation   Date/Time:  Tuesday July 30 2015 18:34:13 EDT Ventricular Rate:  75 PR Interval:    QRS Duration: 74 QT Interval:  365 QTC Calculation: 408 R Axis:   13 Text Interpretation:  Low voltage, precordial leads Confirmed by Lita Mains   MD, Bibiana Gillean ([id number]) on 07/30/2015 7:07:07 PM      MDM   Final diagnoses:  CAP (community acquired pneumonia)  Dysphagia    Patient with low-grade fever in triage. Her blood pressure is borderline hypotensive. We'll screen for sepsis. CT without any evidence of acute findings. Doubt stroke-like symptoms. Concern for inflammatory or infectious process causing the patient's voice changes. Will get CT neck.  No acute CT head or CT neck findings. Infiltrate on x-ray. We'll treat for pneumonia but they're well may be due to aspiration. Discussed with Dr. Hal Hope. Will get strep swallow screen in the emergency department and admit to ob's telemetry.  Julianne Rice, MD 07/30/15 2128

## 2015-07-31 ENCOUNTER — Encounter (HOSPITAL_COMMUNITY)
Admission: EM | Disposition: A | Discharge status: Home Health | Payer: Self-pay | Source: Home / Self Care | Attending: Internal Medicine

## 2015-07-31 ENCOUNTER — Observation Stay (HOSPITAL_COMMUNITY): Payer: PPO

## 2015-07-31 ENCOUNTER — Encounter (HOSPITAL_COMMUNITY): Payer: Self-pay | Admitting: *Deleted

## 2015-07-31 DIAGNOSIS — R001 Bradycardia, unspecified: Secondary | ICD-10-CM

## 2015-07-31 DIAGNOSIS — I4891 Unspecified atrial fibrillation: Secondary | ICD-10-CM | POA: Diagnosis not present

## 2015-07-31 DIAGNOSIS — I495 Sick sinus syndrome: Secondary | ICD-10-CM | POA: Diagnosis not present

## 2015-07-31 DIAGNOSIS — S3991XA Unspecified injury of abdomen, initial encounter: Secondary | ICD-10-CM | POA: Diagnosis not present

## 2015-07-31 DIAGNOSIS — F05 Delirium due to known physiological condition: Secondary | ICD-10-CM

## 2015-07-31 DIAGNOSIS — R269 Unspecified abnormalities of gait and mobility: Secondary | ICD-10-CM | POA: Diagnosis not present

## 2015-07-31 DIAGNOSIS — F0390 Unspecified dementia without behavioral disturbance: Secondary | ICD-10-CM | POA: Diagnosis not present

## 2015-07-31 DIAGNOSIS — I455 Other specified heart block: Secondary | ICD-10-CM | POA: Diagnosis not present

## 2015-07-31 DIAGNOSIS — E039 Hypothyroidism, unspecified: Secondary | ICD-10-CM | POA: Diagnosis not present

## 2015-07-31 DIAGNOSIS — S3993XA Unspecified injury of pelvis, initial encounter: Secondary | ICD-10-CM | POA: Diagnosis not present

## 2015-07-31 DIAGNOSIS — R109 Unspecified abdominal pain: Secondary | ICD-10-CM | POA: Diagnosis not present

## 2015-07-31 DIAGNOSIS — J690 Pneumonitis due to inhalation of food and vomit: Secondary | ICD-10-CM | POA: Diagnosis not present

## 2015-07-31 DIAGNOSIS — R270 Ataxia, unspecified: Secondary | ICD-10-CM

## 2015-07-31 HISTORY — PX: EP IMPLANTABLE DEVICE: SHX172B

## 2015-07-31 LAB — COMPREHENSIVE METABOLIC PANEL
ALBUMIN: 2.6 g/dL — AB (ref 3.5–5.0)
ALK PHOS: 56 U/L (ref 38–126)
ALT: 13 U/L — AB (ref 14–54)
AST: 21 U/L (ref 15–41)
Anion gap: 9 (ref 5–15)
BILIRUBIN TOTAL: 0.7 mg/dL (ref 0.3–1.2)
BUN: 23 mg/dL — AB (ref 6–20)
CALCIUM: 8.1 mg/dL — AB (ref 8.9–10.3)
CHLORIDE: 108 mmol/L (ref 101–111)
CO2: 18 mmol/L — ABNORMAL LOW (ref 22–32)
CREATININE: 1.04 mg/dL — AB (ref 0.44–1.00)
GFR, EST NON AFRICAN AMERICAN: 53 mL/min — AB (ref >60)
Glucose, Bld: 84 mg/dL (ref 65–99)
Potassium: 3.8 mmol/L (ref 3.5–5.1)
Sodium: 135 mmol/L (ref 135–145)
Total Protein: 5.4 g/dL — ABNORMAL LOW (ref 6.5–8.1)

## 2015-07-31 LAB — VITAMIN B12: Vitamin B-12: 6903 pg/mL — ABNORMAL HIGH (ref 180–914)

## 2015-07-31 LAB — STREP PNEUMONIAE URINARY ANTIGEN: STREP PNEUMO URINARY ANTIGEN: NEGATIVE

## 2015-07-31 LAB — TROPONIN I

## 2015-07-31 LAB — GLUCOSE, CAPILLARY: Glucose-Capillary: 91 mg/dL (ref 65–99)

## 2015-07-31 LAB — LIPID PANEL
CHOLESTEROL: 110 mg/dL (ref 0–200)
HDL: 52 mg/dL (ref >40)
LDL Cholesterol: 38 mg/dL (ref 0–99)
TRIGLYCERIDES: 102 mg/dL (ref <150)
Total CHOL/HDL Ratio: 2.1 RATIO
VLDL: 20 mg/dL (ref 0–40)

## 2015-07-31 LAB — CORTISOL: Cortisol, Plasma: 9.9 ug/dL

## 2015-07-31 LAB — TSH: TSH: 4.318 u[IU]/mL (ref 0.350–4.500)

## 2015-07-31 LAB — HIV ANTIBODY (ROUTINE TESTING W REFLEX): HIV SCREEN 4TH GENERATION: NONREACTIVE

## 2015-07-31 LAB — CBC
HEMATOCRIT: 33.7 % — AB (ref 36.0–46.0)
Hemoglobin: 10.7 g/dL — ABNORMAL LOW (ref 12.0–15.0)
MCH: 28.3 pg (ref 26.0–34.0)
MCHC: 31.8 g/dL (ref 30.0–36.0)
MCV: 89.2 fL (ref 78.0–100.0)
PLATELETS: 191 10*3/uL (ref 150–400)
RBC: 3.78 MIL/uL — ABNORMAL LOW (ref 3.87–5.11)
RDW: 13.4 % (ref 11.5–15.5)
WBC: 9.2 10*3/uL (ref 4.0–10.5)

## 2015-07-31 LAB — LACTIC ACID, PLASMA: LACTIC ACID, VENOUS: 0.7 mmol/L (ref 0.5–1.9)

## 2015-07-31 LAB — T4, FREE: FREE T4: 0.69 ng/dL (ref 0.61–1.12)

## 2015-07-31 SURGERY — PACEMAKER IMPLANT

## 2015-07-31 MED ORDER — MIDAZOLAM HCL 5 MG/5ML IJ SOLN
INTRAMUSCULAR | Status: DC | PRN
  Administered 2015-07-31: 1 mg via INTRAVENOUS

## 2015-07-31 MED ORDER — FENTANYL CITRATE (PF) 100 MCG/2ML IJ SOLN
INTRAMUSCULAR | Status: DC | PRN
  Administered 2015-07-31: 12.5 ug via INTRAVENOUS

## 2015-07-31 MED ORDER — SODIUM CHLORIDE 0.9 % IR SOLN
Status: AC
  Filled 2015-07-31: qty 2

## 2015-07-31 MED ORDER — SODIUM CHLORIDE 0.9 % IR SOLN
Status: DC | PRN
  Administered 2015-07-31: 18:00:00

## 2015-07-31 MED ORDER — MIDAZOLAM HCL 5 MG/5ML IJ SOLN
INTRAMUSCULAR | Status: AC
  Filled 2015-07-31: qty 5

## 2015-07-31 MED ORDER — ONDANSETRON HCL 4 MG/2ML IJ SOLN: 4.0000 mg | Freq: Four times a day (QID) | INTRAMUSCULAR | Status: DC | PRN

## 2015-07-31 MED ORDER — POLYETHYLENE GLYCOL 3350 17 G PO PACK
17.0000 g | PACK | Freq: Every day | ORAL | Status: DC
  Administered 2015-08-01: 17 g via ORAL
  Filled 2015-07-31 (×2): qty 1

## 2015-07-31 MED ORDER — VANCOMYCIN HCL IN DEXTROSE 1-5 GM/200ML-% IV SOLN
1000.0000 mg | Freq: Two times a day (BID) | INTRAVENOUS | Status: AC
  Administered 2015-08-01: 1000 mg via INTRAVENOUS
  Filled 2015-07-31 (×2): qty 200

## 2015-07-31 MED ORDER — LIDOCAINE HCL (PF) 1 % IJ SOLN
INTRAMUSCULAR | Status: DC | PRN
  Administered 2015-07-31: 41 mL via INTRADERMAL
  Administered 2015-07-31: 1 mL via INTRADERMAL

## 2015-07-31 MED ORDER — HEPARIN (PORCINE) IN NACL 2-0.9 UNIT/ML-% IJ SOLN
INTRAMUSCULAR | Status: DC | PRN
  Administered 2015-07-31: 18:00:00

## 2015-07-31 MED ORDER — VANCOMYCIN HCL IN DEXTROSE 1-5 GM/200ML-% IV SOLN
INTRAVENOUS | Status: AC
  Filled 2015-07-31: qty 200

## 2015-07-31 MED ORDER — METRONIDAZOLE IN NACL 5-0.79 MG/ML-% IV SOLN
500.0000 mg | Freq: Three times a day (TID) | INTRAVENOUS | Status: DC
  Administered 2015-07-31 – 2015-08-02 (×7): 500 mg via INTRAVENOUS
  Filled 2015-07-31 (×7): qty 100

## 2015-07-31 MED ORDER — SODIUM CHLORIDE 0.9 % IV BOLUS (SEPSIS)
500.0000 mL | Freq: Once | INTRAVENOUS | Status: AC
  Administered 2015-07-31: 500 mL via INTRAVENOUS

## 2015-07-31 MED ORDER — HEPARIN (PORCINE) IN NACL 2-0.9 UNIT/ML-% IJ SOLN
INTRAMUSCULAR | Status: AC
  Filled 2015-07-31: qty 500

## 2015-07-31 MED ORDER — FENTANYL CITRATE (PF) 100 MCG/2ML IJ SOLN
INTRAMUSCULAR | Status: AC
  Filled 2015-07-31: qty 2

## 2015-07-31 MED ORDER — LIDOCAINE HCL (PF) 1 % IJ SOLN
INTRAMUSCULAR | Status: AC
  Filled 2015-07-31: qty 60

## 2015-07-31 MED ORDER — LEVOTHYROXINE SODIUM 25 MCG PO TABS
12.5000 ug | ORAL_TABLET | Freq: Every day | ORAL | Status: DC
  Administered 2015-07-31 – 2015-08-02 (×3): 12.5 ug via ORAL
  Filled 2015-07-31 (×3): qty 1

## 2015-07-31 MED ORDER — ACETAMINOPHEN 325 MG PO TABS
325.0000 mg | ORAL_TABLET | ORAL | Status: DC | PRN
  Administered 2015-07-31: 650 mg via ORAL
  Filled 2015-07-31: qty 2

## 2015-07-31 MED ORDER — VANCOMYCIN HCL 1000 MG IV SOLR
1000.0000 mg | INTRAVENOUS | Status: DC | PRN
  Administered 2015-07-31: 1000 mg via INTRAVENOUS

## 2015-07-31 MED ORDER — SODIUM CHLORIDE 0.9 % IV SOLN
INTRAVENOUS | Status: DC
  Administered 2015-07-31 (×2): via INTRAVENOUS

## 2015-07-31 MED ORDER — SENNOSIDES-DOCUSATE SODIUM 8.6-50 MG PO TABS
2.0000 | ORAL_TABLET | Freq: Every day | ORAL | Status: DC
  Administered 2015-08-01: 2 via ORAL
  Filled 2015-07-31: qty 2

## 2015-07-31 SURGICAL SUPPLY — 7 items
CABLE SURGICAL S-101-97-12 (CABLE) ×2 IMPLANT
LEAD TENDRIL MRI 46CM LPA1200M (Lead) ×2 IMPLANT
LEAD TENDRIL MRI 52CM LPA1200M (Lead) ×2 IMPLANT
PACEMAKER ASSURITY DR-RF (Pacemaker) ×2 IMPLANT
PAD DEFIB LIFELINK (PAD) ×2 IMPLANT
SHEATH CLASSIC 8F (SHEATH) ×4 IMPLANT
TRAY PACEMAKER INSERTION (PACKS) ×2 IMPLANT

## 2015-07-31 NOTE — Consult Note (Signed)
ELECTROPHYSIOLOGY CONSULT NOTE    Patient ID: Pamela Kidd MRN: [id number], DOB/AGE: 1945-12-26 70 y.o.  Admit date: 07/30/2015 Date of Consult: 07/31/2015  Primary Physician: [name], MD Primary Cardiologist: Dr. Martinique Requesting MD: Dr. Lindaann Pascal  Reason for Consultation: bradycardia, prolonged pause  HPI: Pamela Kidd is a 70 y.o. female with PMHx of CAD (DES to prox LAD in 2013), oral cancer treated with Radiation and chemo in 2003, HLD, dementia, and depression was admitted to [facility] yesterday with c/o new ataxia and difficulty with speech, concerns of possible pneumonia yesterday with low grade temp of 100.7. Afebrile today with normal WBC.  Labs: K+ 4.0 BUN/Creat 21/1.33 Trop 0.00 H/H 12/40 WBC 9.8  The patient's husband at bed side reports that yesterday things were as usual with the patient, she reportedly often has trouble sleeping and had the night before, and yesterday was sleepy all day, took naps and persistently c/o being tired, though this not unusual for her necessarily. She was at his job site (as usual) and napped in his truck.   She had no c/o otherwise to him.  After he had completed work when they arrived back home she again stated she was tired and he let her sleep more while he did some chores at home, when he was done about 1/2 hour or so he woke her to go in the house and noted she was having trouble walking, at first thought secondary to a recent ankle injury but as they walked she required almost full assistance from him.  The patient herself today continued to defer to the husband regarding the events, seems unsure of details exactly, with a known diagnosis of dementia, he reports day to day memory is an issue for her.  The husband said he noted her speech somewhat slurred through the day though attributed it to her sleepy state.  EP is being called secondary to evaluate an asystolic event that occurred around 11 yesterday.  The patient  denies any CP, states even with her MI she had no CP, she denies any SOB, states she slept well last night, no nausea or vomiting or events of any kind, today's RN states she was not given any report of any particular events of any kind last evening for the patient.  She does have a new history of syncope in the last 2 months, possibly longer.  The husband states the worst occurred  2 weeks ago in the kitchen told her husband she felt faint he turned and was able to catch her before she fell, lowered her to the floor and states she was out about 20 seconds, woke on her own without symptoms, the next 4-5 days later while out to eat said she was going to faint, was seated but did faint before he could get to her and she hit her head on the table/ledge beside her, again woke on her own about 10 seconds later.  She had been having brief spells that are a second or few seconds for a couple months.  Past Medical History  Diagnosis Date  . Ischemic cardiomyopathy     a. 08/27/2011 Echo: EF 35-40%, mid-dist anteroseptal/apical akinesis, Gr 1 DD, mild-mod TR, PASP 30mmHg  . Hypotension     a. preventing use of ACEI  . Diverticulosis   . Hyperlipidemia     takes Crestor nightly  . Asthmatic bronchitis     "as a child; haven't had any since ~ I was 6 yr old" (06/08/2012)  .  Anemia     "I've always been anemic; never had a transfusion" (06/08/2012)  . H/O hiatal hernia   . Oral cancer ([place]) 2003    a. s/p radiation/chemotherapy  . Emphysema of lung ([place])   . GERD (gastroesophageal reflux disease)     takes Protonix daily  . Constipation     takes Miralax daily prn  . CAD (coronary artery disease)     a. 08/26/2011 Acute Ant STEMI/Cath/PCI: LM nl, LAD 100p -> 2.75x23 Xience Xpedition, LCX 20, minor irregs, RCA 20-3mid, EF 30-35%, anterior/apical AK;  b. 05/2012 Cath: LM nl, LAD patent LAD stent, LCX 30ost, RCA dom, nl, EF 65%.  . Complication of anesthesia     slow to wake up  . Vertigo     occasionally   . Bruises easily     d/t taking Plavix and ASA  . History of colon polyps   . Dry mouth   . PONV (postoperative nausea and vomiting)   . STEMI (ST elevation myocardial infarction) ([place]) 08/2011    anterior/notes 09/07/2011 (06/08/2012)  . Gastritis   . Dementia   . Osteoporosis      Surgical History:  Past Surgical History  Procedure Laterality Date  . Bunionectomy Bilateral 1970's  . Shoulder open rotator cuff repair Left 1990's  . Tonsillectomy  ~ 1951  . Vaginal hysterectomy  ~ 1989    partial  . Tubal ligation  1975  . Coronary angioplasty with stent placement  08/2011    "1" (06/08/2012)  . Cardiac catheterization  06/09/2012    patent prox LAD stent, minor nonobstructive LAD disease, 30% ostial LCx, and otherwise widely patent cors; LVEF 65%  . Colonoscopy    . Peg placement      in 2003 and then removed  . Cholecystectomy  08/18/2012  . Cholecystectomy N/A 08/18/2012    Procedure: LAPAROSCOPIC CHOLECYSTECTOMY WITH INTRAOPERATIVE CHOLANGIOGRAM;  Surgeon: Imogene Burn. Georgette Dover, MD;  Location: [facility];  Service: General;  Laterality: N/A;  . Left heart catheterization with coronary angiogram N/A 08/26/2011    Procedure: LEFT HEART CATHETERIZATION WITH CORONARY ANGIOGRAM;  Surgeon: Peter M Martinique, MD;  Location: [facility] CATH LAB;  Service: Cardiovascular;  Laterality: N/A;  . Percutaneous coronary stent intervention (pci-s) N/A 08/26/2011    Procedure: PERCUTANEOUS CORONARY STENT INTERVENTION (PCI-S);  Surgeon: Peter M Martinique, MD;  Location: [facility] CATH LAB;  Service: Cardiovascular;  Laterality: N/A;  . Left heart catheterization with coronary angiogram N/A 06/09/2012    Procedure: LEFT HEART CATHETERIZATION WITH CORONARY ANGIOGRAM;  Surgeon: Sherren Mocha, MD;  Location: [facility] CATH LAB;  Service: Cardiovascular;  Laterality: N/A;     Prescriptions prior to admission  Medication Sig Dispense Refill Last Dose  . aspirin EC 81 MG tablet Take 1 tablet (81 mg total) by mouth daily. 90 tablet 3 Taking  .  folic acid (FOLVITE) 1 MG tablet Take 1 mg by mouth daily.   Taking  . hydrOXYzine (ATARAX/VISTARIL) 10 MG tablet Take 2.5 tablets (25 mg total) by mouth 3 (three) times daily. 15 tablet 0   . LORazepam (ATIVAN) 0.5 MG tablet Take 1 tablet (0.5 mg total) by mouth 2 (two) times daily as needed for anxiety (panic attack). 10 tablet 0 Taking  . megestrol (MEGACE) 400 MG/10ML suspension Take 10 mLs (400 mg total) by mouth daily. 240 mL 3 Taking  . mirtazapine (REMERON) 30 MG tablet TAKE ONE TABLET BY MOUTH AT BEDTIME 30 tablet 5   . naproxen (NAPROSYN) 500 MG tablet Take  1 tablet (500 mg total) by mouth 2 (two) times daily. 20 tablet 0   . nitroGLYCERIN (NITROSTAT) 0.4 MG SL tablet Place 1 tablet (0.4 mg total) under the tongue every 5 (five) minutes x 3 doses as needed for chest pain. 30 tablet 3   . pantoprazole (PROTONIX) 40 MG tablet Take 1 tablet (40 mg total) by mouth daily before breakfast. 90 tablet 3   . rosuvastatin (CRESTOR) 10 MG tablet Take 1 tablet (10 mg total) by mouth daily. 90 tablet 3   . sertraline (ZOLOFT) 100 MG tablet Take 1 tablet by mouth daily.  5 Taking  . traMADol (ULTRAM) 50 MG tablet Take 1 tablet (50 mg total) by mouth every 8 (eight) hours as needed. 30 tablet 0 Taking  . vitamin B-12 (CYANOCOBALAMIN) 1000 MCG tablet Take 1,000 mcg by mouth daily.   Taking    Inpatient Medications:  . aspirin  300 mg Rectal Daily   Or  . aspirin  325 mg Oral Daily  . enoxaparin (LOVENOX) injection  30 mg Subcutaneous QHS  . folic acid  1 mg Oral Daily  . [START ON 08/01/2015] levofloxacin (LEVAQUIN) IV  500 mg Intravenous Q48H  . levothyroxine  12.5 mcg Oral QAC breakfast  . megestrol  400 mg Oral Daily  . metronidazole  500 mg Intravenous Q8H  . mirtazapine  30 mg Oral QHS  . pantoprazole  40 mg Oral QAC breakfast  . rosuvastatin  10 mg Oral q1800  . sertraline  100 mg Oral Daily    Allergies:  Allergies  Allergen Reactions  . Ampicillin Itching and Dermatitis  .  Cisapride     Other reaction(s): Other (See Comments) Unknown  . Codeine Itching and Nausea And Vomiting    Other reaction(s): GI Upset (intolerance)  . Nubain [Nalbuphine Hcl] Other (See Comments)    "headache & disoriented"      Social History   Social History  . Marital Status: Married    Spouse Name: N/A  . Number of Children: 4  . Years of Education: N/A   Occupational History  . Retired    Social History Main Topics  . Smoking status: Never Smoker   . Smokeless tobacco: Never Used  . Alcohol Use: No  . Drug Use: No  . Sexual Activity: No   Other Topics Concern  . Not on file   Social History Narrative     Family History  Problem Relation Age of Onset  . Dementia Mother   . Heart disease Father   . Cancer Father     prostate  . Heart disease Paternal Grandfather   . Colon cancer Neg Hx      Review of Systems: All other systems reviewed and are otherwise negative except as noted above.  Physical Exam: Filed Vitals:   07/31/15 0300 07/31/15 0500 07/31/15 0700 07/31/15 0915  BP: 93/60 92/62 113/69 91/58  Pulse: 66 68 79 74  Temp: 98.2 F (36.8 C) 98.8 F (37.1 C) 98.6 F (37 C)   TempSrc: Oral Oral Oral   Resp: 16 16 17 16   Height:      Weight:      SpO2: 100% 99% 99% 100%    GEN- The patient is frail in appearance, in NAD, alert and oriented to self only.   HEENT: normocephalic, atraumatic; sclera clear, conjunctiva pink; hearing intact; oropharynx clear; neck supple, no JVP Lymph- no cervical lymphadenopathy Lungs- Clear to ausculation bilaterally, normal work of breathing.  No wheezes,  rales, rhonchi Heart- Regular rate and rhythm, no murmurs, rubs or gallops GI- soft, non-tender, non-distended Extremities- no clubbing, cyanosis, or edema MS- no significant deformity, age appropriate atrophy Skin- warm and dry, no rash or lesion Psych- euthymic mood, full affect Neuro- no gross deficits observed  Labs:   Lab Results  Component Value  Date   WBC 9.8 07/30/2015   HGB 12.9 07/30/2015   HCT 40.1 07/30/2015   MCV 90.3 07/30/2015   PLT 231 07/30/2015    Recent Labs Lab 07/30/15 1451  NA 134*  K 4.0  CL 101  CO2 24  BUN 21*  CREATININE 1.33*  CALCIUM 9.1  PROT 7.2  BILITOT 0.6  ALKPHOS 72  ALT 15  AST 24  GLUCOSE 115*      Radiology/Studies:  Ct Abdomen Pelvis Wo Contrast 07/31/2015  CLINICAL DATA:  Pt c/o back and left flank pain s/p fall Unable to recall when fall occurred Hx of dementia, hiatal hernia, diverticulosis, cholecystectomy, hysterectomy EXAM: CT ABDOMEN AND PELVIS WITHOUT CONTRAST TECHNIQUE: Multidetector CT imaging of the abdomen and pelvis was performed following the standard protocol without IV contrast. COMPARISON:  None. FINDINGS: Lower chest: Small bilateral pleural effusions. Minimal consolidation noted at both lung bases, left greater than right. Coronary arterial calcification noted. Hepatobiliary: No focal abnormality identified within the liver. Status post cholecystectomy. Pancreas: Unremarkable. Spleen: Normal in appearance. Adrenals/Urinary Tract: Normal appearance of the adrenal glands. Contrast is identified within the collecting systems and ureters. Kidneys are symmetric in size without focal renal mass. Stomach/Bowel: The stomach is normal in appearance. The appendix is well seen and has a normal appearance. Small bowel loops are normal in appearance. Significant amount stool is identified throughout colonic loops. Colonic diverticula are present. No associated inflammation. Vascular/Lymphatic: There is atherosclerotic calcification of the abdominal aorta. Two saccular aneurysms are identified in the infrarenal course, measuring 2.2 and 2.1 cm. No retroperitoneal or mesenteric adenopathy. Reproductive: Status post hysterectomy. No adnexal mass. No free pelvic fluid. Other: Subcutaneous gas identified in the right anterior abdominal wall likely at site of recent injection. Musculoskeletal: No  acute fracture or subluxation. Degenerative changes are seen in lumbar spine and both hips. IMPRESSION: 1. Small bilateral pleural effusions and minimal bibasilar consolidation, left greater than right. 2. Coronary artery disease. 3. No evidence for abdominal injury. 4. Significant stool burden. 5. Colonic diverticulosis. 6. 2 saccular infrarenal abdominal aortic aneurysms measuring 2.2 and 2.1 cm. 7. Status post hysterectomy. 8. Status post cholecystectomy. 9. Probable injection site right anterior abdominal wall. 10. No acute fracture. Electronically Signed   By: Nolon Nations M.D.   On: 07/31/2015 09:20   Dg Chest 2 View 07/30/2015  CLINICAL DATA:  Fever today EXAM: CHEST  2 VIEW COMPARISON:  04/14/2014 FINDINGS: Cardiomediastinal silhouette is stable. Mild hyperinflation. No pleural effusion. There is left base posterior retrocardiac nodular consolidation best seen on lateral view. Pneumonia cannot be excluded. Follow-up to resolution is recommended. No pulmonary edema. Mild levoscoliosis lower thoracic spine. Thoracic spine osteopenia. IMPRESSION: Mild hyperinflation. No pleural effusion. There is left base posterior retrocardiac nodular consolidation best seen on lateral view. Pneumonia cannot be excluded. Follow-up to resolution is recommended. Electronically Signed   By: Lahoma Crocker M.D.   On: 07/30/2015 19:25   Ct Head Wo Contrast 07/30/2015  CLINICAL DATA:  Slurred speech. Unsteady gait. Dizziness. Symptoms began yesterday. Patient has dementia. EXAM: CT HEAD WITHOUT CONTRAST TECHNIQUE: Contiguous axial images were obtained from the base of the skull through the vertex without  intravenous contrast. COMPARISON:  12/23/2012 FINDINGS: The brain shows generalized atrophy. There is no evidence of old or acute small or large vessel infarction. No mass lesion, hemorrhage, hydrocephalus or extra-axial collection. Calvarium is unremarkable. Sinuses, middle ears and mastoids are clear. IMPRESSION: Atrophy  without focal or acute finding. Electronically Signed   By: Nelson Chimes M.D.   On: 07/30/2015 16:08   Ct Soft Tissue Neck W Contrast 07/30/2015  CLINICAL DATA:  Fever.  Mouth and neck pain. EXAM: CT NECK WITH CONTRAST TECHNIQUE: Multidetector CT imaging of the neck was performed using the standard protocol following the bolus administration of intravenous contrast. CONTRAST:  62mL ISOVUE-300 IOPAMIDOL (ISOVUE-300) INJECTION 61% COMPARISON:  None. FINDINGS: Pharynx and larynx: Normal pharynx. Tongue is normal. No evidence of mass or abscess in the pharynx. Atrophic tonsils. Epiglottis and larynx normal. Salivary glands: Atrophic submandibular gland bilaterally. Atrophic parotid gland bilaterally. Thyroid: Negative Lymph nodes: No pathologic adenopathy in the neck. Vascular: Mild carotid artery calcification bilaterally. Carotid artery and jugular vein patent bilaterally. Limited intracranial: Negative Visualized orbits: Negative Mastoids and visualized paranasal sinuses: Negative Skeleton: Mild to moderate cervical degenerative change with disc and facet degeneration. No fracture or bone lesion. Patient is nearly edentulous. Upper anterior teeth show caries. No periapical abscess. Upper chest: Apical scarring bilaterally. IMPRESSION: Negative for abscess in the neck. No mass or adenopathy Dental caries upper anterior teeth without periapical abscess. Electronically Signed   By: Franchot Gallo M.D.   On: 07/30/2015 20:23   Mr Brain Wo Contrast 07/31/2015  CLINICAL DATA:  Unsteady gait, speech difficulty beginning last night. EXAM: MRI HEAD WITHOUT CONTRAST TECHNIQUE: Multiplanar, multiecho pulse sequences of the brain and surrounding structures were obtained without intravenous contrast. COMPARISON:  CT HEAD July 30, 2015 FINDINGS: INTRACRANIAL CONTENTS: No reduced diffusion to suggest acute ischemia. No susceptibility artifact to suggest hemorrhage. Mild ventriculomegaly, on the basis of global parenchymal brain  volume loss. Scattered subcentimeter supratentorial white matter FLAIR T2 hyperintensities most commonly seen with chronic small vessel ischemic disease, normal for age. No suspicious parenchymal signal, masses or mass effect. No abnormal extra-axial fluid collections. No extra-axial masses though, contrast enhanced sequences would be more sensitive. Normal major intracranial vascular flow voids present at skull base. ORBITS: The included ocular globes and orbital contents are non-suspicious. SINUSES: The mastoid air-cells and included paranasal sinuses are well-aerated. SKULL/SOFT TISSUES: No abnormal sellar expansion. No suspicious calvarial bone marrow signal. Craniocervical junction maintained. Atrophic adenoidal soft tissues and fatty replaced parotid glands, query prior radiation. Patient is edentulous. IMPRESSION: No acute intracranial process. Mild global parenchymal brain volume loss and mild chronic small vessel ischemic disease. Electronically Signed   By: Elon Alas M.D.   On: 07/31/2015 06:52   Dg Pelvis Portable 07/31/2015  CLINICAL DATA:  Recent fall, patient reports shortness in the pelvis and hips EXAM: PORTABLE PELVIS 1-2 VIEWS COMPARISON:  None in PACs FINDINGS: The bones are subjectively osteopenic. No acute or healing pelvic fracture is observed. There is mild degenerative change of both hips greater on the right. No acute hip fracture is demonstrated. The femoral necks and intertrochanteric and subtrochanteric regions appear normal. IMPRESSION: There is mild degenerative change of both hips greatest on the right. There is no acute fracture. If there are strong clinical concerns of a hip fracture, a dedicated hip x-ray series would be recommended. Electronically Signed   By: David  Martinique M.D.   On: 07/31/2015 08:00    EKG: SR, 75bpm, PR 141ms, QRS 38ms  TELEMETRY: SR70's-80's,  last PM slowed to pause/asystolic event of 8 seconds, followed by 3.8second pause and a 4.7 second pause,  then SR 70's.  12/01/11: Echocardiogram Study Conclusions - Left ventricle: The cavity size was normal. Wall thickness was normal. Systolic function was normal. The estimated ejection fraction was in the range of 60% to 65%. - Tricuspid valve: Moderate regurgitation.  03/21/14: Lexiscan stress test Impression Exercise Capacity: Lexiscan with no exercise. BP Response: Hypotensive blood pressure response. Clinical Symptoms: dyspnea, abdominal pain, pre-syncope ECG Impression: No significant ECG changes with Lexiscan. Comparison with Prior Nuclear Study: Prior study in 2013 shows normal perfusion Overall Impression: Low risk stress nuclear study with moderate size and intensity, predominantly fixed septal defect, no significant reversible ischemia. LV Ejection Fraction: 78%. LV Wall Motion: Septal dysynchrony    Assessment and Plan:   1. Asystolic event 0000000 seconds at 23:30 last PM       Recent new history of unprovoked syncopal events      No rate limiting or nodal blocking medds here or at home      Will check an echo, TSH she will likely need a PPM implant      Will discuss further, she is being treated for pneumonia  2. Ataxia, slurred speech, confusion     The patient's son finds her confused more then her baseline     Deferred to attending  3. Low grade temp on admit     nml WBC     ? Pneumonia  4. CAD     No complaints     On ASA, statin        Signed, Tommye Standard, PA-C 07/31/2015 10:45 AM   Ep Attending  Patient seen and examined. Agree with the findings as noted above. Her exam is notable for a frail appearing woman, NAD, RRR, clear lungs and soft abdomen. No edema. Tele - NSR /sinus brady with pauses of up to 8 seconds.  A/P 1. Syncope - with documented sinus node dysfunction, the likelihood that sinus node dysfunction is causing her syncope is very high. I have recommended proceeding with PPM. 2. Dementia - on my exam, this appears fairly  mild. 3. Malnutrition - she is very thin and is encouraged to increase her oral intake.  Mikle Bosworth.D.

## 2015-07-31 NOTE — Evaluation (Signed)
Physical Therapy Evaluation Patient Details Name: Pamela Kidd MRN: [id number] DOB: May 26, 1945 Today's Date: 07/31/2015   History of Present Illness  Pamela Kidd is a 70 y.o. 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 Pt with possible PNA, mildly febrile, and left flank pain from fall that she does not remember  Clinical Impression  Pt admitted with above diagnosis. Pt currently with functional limitations due to the deficits listed below (see PT Problem List). Pt ambulated 40' with min HHA. Would benefit from youth height RW as the one she has at home is too tall and so she doesn't use it.  Pt will benefit from skilled PT to increase their independence and safety with mobility to allow discharge to the venue listed below.       Follow Up Recommendations Home health PT;Supervision for mobility/OOB    Equipment Recommendations  Rolling walker with 5" wheels (youth height)    Recommendations for Other Services       Precautions / Restrictions Precautions Precautions: Fall Restrictions Weight Bearing Restrictions: No      Mobility  Bed Mobility Overal bed mobility: Needs Assistance Bed Mobility: Supine to Sit;Sit to Supine     Supine to sit: Supervision Sit to supine: Supervision   General bed mobility comments: pt able to get to EOB and then back into bed with slow movements but no physical assist needed  Transfers Overall transfer level: Needs assistance Equipment used: 1 person hand held assist Transfers: Sit to/from Stand Sit to Stand: Min assist         General transfer comment: min HHA (RW in room too tall)  Ambulation/Gait Ambulation/Gait assistance: Min assist Ambulation Distance (Feet): 40 Feet Assistive device: 1 person hand held  assist Gait Pattern/deviations: Step-through pattern;Trunk flexed;Shuffle;Decreased stride length Gait velocity: decreased Gait velocity interpretation: Below normal speed for age/gender General Gait Details: pt happy to be ambulating because backside sore from bed but distance limited because pt's BP has been low today, bolus just started. Min HHA for balance, 1 LOB with min A to correct  Stairs            Wheelchair Mobility    Modified Rankin (Stroke Patients Only)       Balance Overall balance assessment: Needs assistance;History of Falls Sitting-balance support: Feet supported;No upper extremity supported Sitting balance-Leahy Scale: Fair     Standing balance support: No upper extremity supported;During functional activity Standing balance-Leahy Scale: Poor Standing balance comment: needs UE support for safety                             Pertinent Vitals/Pain Pain Assessment: No/denies pain    Home Living Family/patient expects to be discharged to:: Private residence Living Arrangements: Spouse/significant other Available Help at Discharge: Family;Available PRN/intermittently Type of Home: House Home Access: Stairs to enter Entrance Stairs-Rails: Right;Left;Can reach both Entrance Stairs-Number of Steps: 2 Home Layout: One level Home Equipment: Walker - 2 wheels;Other (comment) (but RW too tall for her) Additional Comments: pt's husband does some handyman work in the mornings occasionally while she's watching her "shows". Then he comes home and takes her to lunch and takes her back to work if he has any left and she waits in the car. He does not work every day  Prior Function Level of Independence: Needs assistance   Gait / Transfers Assistance Needed: husband supervises her. Has a RW but it is too tall and often does not use  ADL's / Homemaking Assistance Needed: husband assists        Hand Dominance        Extremity/Trunk Assessment    Upper Extremity Assessment: Defer to OT evaluation           Lower Extremity Assessment: Generalized weakness      Cervical / Trunk Assessment: Kyphotic  Communication   Communication: HOH  Cognition Arousal/Alertness: Awake/alert Behavior During Therapy: WFL for tasks assessed/performed Overall Cognitive Status: History of cognitive impairments - at baseline                      General Comments      Exercises        Assessment/Plan    PT Assessment Patient needs continued PT services  PT Diagnosis Difficulty walking;Generalized weakness   PT Problem List Decreased strength;Decreased activity tolerance;Decreased balance;Decreased mobility;Decreased cognition;Decreased knowledge of use of DME;Decreased knowledge of precautions;Cardiopulmonary status limiting activity  PT Treatment Interventions DME instruction;Gait training;Stair training;Functional mobility training;Therapeutic activities;Therapeutic exercise;Balance training;Patient/family education   PT Goals (Current goals can be found in the Care Plan section) Acute Rehab PT Goals Patient Stated Goal: return home to her 5 dogs PT Goal Formulation: With patient/family Time For Goal Achievement: 08/14/15 Potential to Achieve Goals: Good    Frequency Min 3X/week   Barriers to discharge        Co-evaluation               End of Session Equipment Utilized During Treatment: Gait belt Activity Tolerance: Patient tolerated treatment well Patient left: in bed;with call bell/phone within reach;with bed alarm set;with family/visitor present Nurse Communication: Mobility status    Functional Assessment Tool Used: clinical judgement Functional Limitation: Mobility: Walking and moving around Mobility: Walking and Moving Around Current Status [id number]): At least 1 percent but less than 20 percent impaired, limited or restricted Mobility: Walking and Moving Around Goal Status [phone]): 0 percent impaired,  limited or restricted    Time: 1424-1457 PT Time Calculation (min) (ACUTE ONLY): 33 min   Charges:   PT Evaluation $PT Eval Moderate Complexity: 1 Procedure PT Treatments $Gait Training: 8-22 mins   PT G Codes:   PT G-Codes **NOT FOR INPATIENT CLASS** Functional Assessment Tool Used: clinical judgement Functional Limitation: Mobility: Walking and moving around Mobility: Walking and Moving Around Current Status [id number]): At least 1 percent but less than 20 percent impaired, limited or restricted Mobility: Walking and Moving Around Goal Status [phone]): 0 percent impaired, limited or restricted  Leighton Roach, [place], [place] 07/31/2015, 3:18 PM

## 2015-07-31 NOTE — Significant Event (Signed)
Rapid Response Event Note  Overview: Time Called: 2300 Arrival Time: 2303 Event Type: Hypotension  Initial Focused Assessment: Hypotension     Interventions: NS 530ml bolus  Plan of Care (if not transferred): IV   Event Summary: Name of Physician Notified: Forrest Moron, NP at 2245    at 2318  Outcome: Stayed in room and stabalized   Called to assist with care of patient with low BP of [id number] systolic. Patient is alert, confused but responsive. Skin is warm and dry. Heart sounds irregular. Unit RN reports patient had a rhythm change after becoming tachycardic in the 120's-140's. EKG was performed per hospital protocol. Rhythm is now atrial fibrillation. The on-cal provider was made aware of patients changes and a NS 561ml bolus was ordered. BP continues to be in the 70's and a second 531ml bolus was given. Patient remains asymptomatic of low bp's. We will continue to assist with care as needed.   Pamela Kidd [place]

## 2015-07-31 NOTE — Progress Notes (Signed)
Asking appropriate questions post PPM implant. Took a few sips of OJ.

## 2015-07-31 NOTE — Evaluation (Signed)
Clinical/Bedside Swallow Evaluation Patient Details  Name: Pamela Kidd MRN: [id number] Date of Birth: 06-06-1945  Today's Date: 07/31/2015 Time: SLP Start Time (ACUTE ONLY): 1000 SLP Stop Time (ACUTE ONLY): 1048 SLP Time Calculation (min) (ACUTE ONLY): 48 min  Past Medical History:  Past Medical History  Diagnosis Date  . Ischemic cardiomyopathy     a. 08/27/2011 Echo: EF 35-40%, mid-dist anteroseptal/apical akinesis, Gr 1 DD, mild-mod TR, PASP 80mmHg  . Hypotension     a. preventing use of ACEI  . Diverticulosis   . Hyperlipidemia     takes Crestor nightly  . Asthmatic bronchitis     "as a child; haven't had any since ~ I was 72 yr old" (06/08/2012)  . Anemia     "I've always been anemic; never had a transfusion" (06/08/2012)  . H/O hiatal hernia   . Oral cancer ([place]) 2003    a. s/p radiation/chemotherapy  . Emphysema of lung ([place])   . GERD (gastroesophageal reflux disease)     takes Protonix daily  . Constipation     takes Miralax daily prn  . CAD (coronary artery disease)     a. 08/26/2011 Acute Ant STEMI/Cath/PCI: LM nl, LAD 100p -> 2.75x23 Xience Xpedition, LCX 20, minor irregs, RCA 20-68mid, EF 30-35%, anterior/apical AK;  b. 05/2012 Cath: LM nl, LAD patent LAD stent, LCX 30ost, RCA dom, nl, EF 65%.  . Complication of anesthesia     slow to wake up  . Vertigo     occasionally  . Bruises easily     d/t taking Plavix and ASA  . History of colon polyps   . Dry mouth   . PONV (postoperative nausea and vomiting)   . STEMI (ST elevation myocardial infarction) ([place]) 08/2011    anterior/notes 09/07/2011 (06/08/2012)  . Gastritis   . Dementia   . Osteoporosis    Past Surgical History:  Past Surgical History  Procedure Laterality Date  . Bunionectomy Bilateral 1970's  . Shoulder open rotator cuff repair Left 1990's  . Tonsillectomy  ~ 1951  . Vaginal hysterectomy  ~ 1989    partial  . Tubal ligation  1975  . Coronary angioplasty with stent placement  08/2011    "1"  (06/08/2012)  . Cardiac catheterization  06/09/2012    patent prox LAD stent, minor nonobstructive LAD disease, 30% ostial LCx, and otherwise widely patent cors; LVEF 65%  . Colonoscopy    . Peg placement      in 2003 and then removed  . Cholecystectomy  08/18/2012  . Cholecystectomy N/A 08/18/2012    Procedure: LAPAROSCOPIC CHOLECYSTECTOMY WITH INTRAOPERATIVE CHOLANGIOGRAM;  Surgeon: Imogene Burn. Georgette Dover, MD;  Location: [facility];  Service: General;  Laterality: N/A;  . Left heart catheterization with coronary angiogram N/A 08/26/2011    Procedure: LEFT HEART CATHETERIZATION WITH CORONARY ANGIOGRAM;  Surgeon: Peter M Martinique, MD;  Location: [facility] CATH LAB;  Service: Cardiovascular;  Laterality: N/A;  . Percutaneous coronary stent intervention (pci-s) N/A 08/26/2011    Procedure: PERCUTANEOUS CORONARY STENT INTERVENTION (PCI-S);  Surgeon: Peter M Martinique, MD;  Location: [facility] CATH LAB;  Service: Cardiovascular;  Laterality: N/A;  . Left heart catheterization with coronary angiogram N/A 06/09/2012    Procedure: LEFT HEART CATHETERIZATION WITH CORONARY ANGIOGRAM;  Surgeon: Sherren Mocha, MD;  Location: [facility] CATH LAB;  Service: Cardiovascular;  Laterality: N/A;   HPI:  Pamela Kidd is a 70 y.o. female with dementia, CAD status post stenting, hyperlipidemia and oral cancer status post radiation chemotherapy in  2003 with dysphagia was brought to the ER after patient was found to have increasing difficulty walking and speech difficulties. Patient has been having these symptoms for last 24 hours. CT of the head and soft neck tissue was unremarkable. Patient is mildly febrile and chest x-ray shows possible pneumonia. MRI is negative. 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 Pt has had PEG tube at  the time of treatment and has had MBS at [facility] in the recent past per her husband. He reports findings of known aspiration and discussion about what sounds like dilation of the cervical esophagus, which they decided against due to risk.  The pt demonstrates consistent delayed cough with thin liquids with multiple swallows and decreased hyolaryngeal elevation. Pt likely has atrophy of pharyngeal musculature and possible decreased opening of cervical esophagus per report. Nectar thick liquids subjectively elimated cough, but may not necessarily reduce risk of aspiration.  Upon discussion with pt and her husband they definitely do not want any operations or feeding tubes and also do not want to drink thickened liquids as pt will refuse them. Given these decisions, there is no need to persue any further assessment or interventions. SLP discussed the importance of oral care to reduce risk of aspriation pna, particularly given the condition of the pts remaining dentition. SLP offered detailed instruction in oral care and provided materials for patient and family. No f/u needed, discussed with pt and MD. Pt may initaite a dys 2 diet and thin liquids with known risk.     Aspiration Risk  Severe aspiration risk    Diet Recommendation Dysphagia 2 (Fine chop);Thin liquid   Liquid Administration via: Cup;Straw Medication Administration: Whole meds with liquid Supervision: Patient able to self feed Compensations: Slow rate;Small sips/bites Postural Changes: Seated upright at 90 degrees    Other  Recommendations Oral Care Recommendations: Oral care before and after PO   Follow up Recommendations  None      Swallow Study   General HPI: Pamela Kidd is a 70 y.o. female with dementia, CAD status post stenting, hyperlipidemia and oral cancer status post radiation chemotherapy in 2003 with dysphagia was brought to the ER after patient was found to have increasing difficulty walking and speech  difficulties. Patient has been having these symptoms for last 24 hours. CT of the head and soft neck tissue was unremarkable. Patient is mildly febrile and chest x-ray shows possible pneumonia. MRI is negative. Pt has a long history of dysphagia with prior PEG tube placement and therapy and stidies at [facility]  Type of Study: Bedside Swallow Evaluation Diet Prior to this Study: NPO Temperature Spikes Noted: No Respiratory Status: Room air History of Recent Intubation: No Behavior/Cognition: Alert;Cooperative;Confused Oral Cavity Assessment: Within Functional Limits Oral Care Completed by SLP: No Oral Cavity - Dentition: Poor condition;Missing dentition Vision: Functional for self-feeding Self-Feeding Abilities: Able to feed self Patient Positioning: Upright in bed Baseline Vocal Quality: Normal Volitional Cough: Weak Volitional Swallow: Able to elicit    Oral/Motor/Sensory Function Overall Oral Motor/Sensory Function: Moderate impairment Facial ROM: Within Functional Limits Facial Symmetry: Within Functional Limits Facial Strength: Within Functional Limits Facial Sensation: Within Functional Limits Lingual ROM: Reduced left;Reduced right Lingual Symmetry: Abnormal symmetry left;Abnormal symmetry right Lingual Strength: Reduced   Ice Chips  Thin Liquid Thin Liquid: Impaired Presentation: Cup;Self Fed Pharyngeal  Phase Impairments: Cough - Delayed;Multiple swallows;Decreased hyoid-laryngeal movement    Nectar Thick Nectar Thick Liquid: Impaired Presentation: Cup Pharyngeal Phase Impairments: Decreased hyoid-laryngeal movement   Honey Thick Honey Thick Liquid: Not tested   Puree Puree: Impaired Presentation: Spoon Pharyngeal Phase Impairments: Decreased hyoid-laryngeal movement   Solid   GO   Solid: Not tested       Herbie [place], MA CCC-SLP [phone]  Lynann Beaver 07/31/2015,11:02 AM

## 2015-07-31 NOTE — Evaluation (Signed)
Occupational Therapy Evaluation Patient Details Name: Pamela Kidd MRN: [id number] DOB: 12/20/1945 Today's Date: 07/31/2015    History of Present Illness Pamela Kidd is a 70 y.o. 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 Pt with possible PNA, mildly febrile, and left flank pain from fall that she does not remember   Clinical Impression   Pt reports she was independent with BADLs PTA. Currently pt overall min guard assist for ADLs and functional mobility. Pt presenting with generalized weakness impacting her independence and safety with ADLs and functional mobility at this time. Pt planning to d/c home with intermittent supervision from family. Pt would benefit from continued skilled OT to address established goals.    Follow Up Recommendations  No OT follow up;Supervision/Assistance - 24 hour    Equipment Recommendations  None recommended by OT    Recommendations for Other Services       Precautions / Restrictions Precautions Precautions: Fall Restrictions Weight Bearing Restrictions: No      Mobility Bed Mobility Overal bed mobility: Needs Assistance Bed Mobility: Supine to Sit;Sit to Supine     Supine to sit: Supervision Sit to supine: Supervision   General bed mobility comments: Supervision for safety; no physical assist required. Increased time needed.  Transfers Overall transfer level: Needs assistance Equipment used: None Transfers: Sit to/from Stand Sit to Stand: Min guard         General transfer comment: Min guard for safety; no physical assist required.    Balance Overall balance assessment: Needs assistance Sitting-balance support: Feet supported;No upper extremity supported Sitting balance-Leahy Scale: Good     Standing balance  support: No upper extremity supported;During functional activity Standing balance-Leahy Scale: Fair Standing balance comment: needs UE support for safety                            ADL Overall ADL's : Needs assistance/impaired Eating/Feeding: NPO   Grooming: Min guard;Standing;Wash/dry face;Wash/dry hands   Upper Body Bathing: Min guard;Sitting   Lower Body Bathing: Min guard;Sit to/from stand   Upper Body Dressing : Min guard;Sitting   Lower Body Dressing: Min guard;Sit to/from stand Lower Body Dressing Details (indicate cue type and reason): Pt able to doff/don socks sitting EOB Toilet Transfer: Min guard;Ambulation;Regular Toilet;Grab bars   Toileting- Clothing Manipulation and Hygiene: Min guard;Sit to/from stand       Functional mobility during ADLs: Min guard General ADL Comments: Pt slightly unsteady on feet but no major LOB; pt holding onto wall during functional mobility in room. Pts husband reports "she looks a lot better than when I brought her in here"     Vision Additional Comments: Appears WFL.   Perception     Praxis      Pertinent Vitals/Pain Pain Assessment: Faces Faces Pain Scale: Hurts little more Pain Location: L lower back Pain Descriptors / Indicators: Sore Pain Intervention(s): Monitored during session     Hand Dominance     Extremity/Trunk Assessment Upper Extremity Assessment Upper Extremity Assessment: Generalized weakness   Lower Extremity Assessment Lower Extremity Assessment: Defer to PT evaluation   Cervical / Trunk Assessment Cervical / Trunk Assessment: Kyphotic   Communication Communication Communication: HOH   Cognition Arousal/Alertness: Awake/alert Behavior During Therapy: WFL for tasks assessed/performed Overall Cognitive Status: History of cognitive impairments -  at baseline                     General Comments       Exercises       Shoulder Instructions      Home Living Family/patient  expects to be discharged to:: Private residence Living Arrangements: Spouse/significant other Available Help at Discharge: Family;Available PRN/intermittently Type of Home: House Home Access: Stairs to enter [employer] of Steps: 2 Entrance Stairs-Rails: Right;Left;Can reach both Home Layout: One level     Bathroom Shower/Tub: Tub/shower unit;Walk-in shower (typically uses tub) Shower/tub characteristics: Curtain Biochemist, clinical: Standard     Home Equipment: Environmental consultant - 2 wheels;Grab bars - toilet;Grab bars - tub/shower   Additional Comments: pt's husband does some handyman work in the mornings occasionally while she's watching her "shows". Then he comes home and takes her to lunch and takes her back to work if he has any left and she waits in the car. He does not work every day      Prior Functioning/Environment Level of Independence: Needs assistance  Gait / Transfers Assistance Needed: husband supervises her. Has a RW but it is too tall and often does not use ADL's / Homemaking Assistance Needed: pt completes BADLs independently        OT Diagnosis: Generalized weakness;Cognitive deficits;Acute pain   OT Problem List: Decreased strength;Impaired balance (sitting and/or standing);Decreased coordination;Decreased cognition;Decreased safety awareness;Decreased knowledge of use of DME or AE;Pain;Cardiopulmonary status limiting activity   OT Treatment/Interventions: Self-care/ADL training;Therapeutic exercise;Energy conservation;DME and/or AE instruction;Therapeutic activities;Patient/family education;Balance training    OT Goals(Current goals can be found in the care plan section) Acute Rehab OT Goals Patient Stated Goal: return home to her 5 dogs OT Goal Formulation: With patient/family Time For Goal Achievement: 08/14/15 Potential to Achieve Goals: Good ADL Goals Pt Will Perform Grooming: with modified independence;standing Pt Will Perform Upper Body Bathing: with  modified independence;sitting;standing Pt Will Perform Lower Body Bathing: with modified independence;sit to/from stand Pt Will Transfer to Toilet: with modified independence;ambulating;regular height toilet;grab bars Pt Will Perform Toileting - Clothing Manipulation and hygiene: with modified independence;sit to/from stand Pt Will Perform Tub/Shower Transfer: Tub transfer;with supervision;ambulating;rolling walker  OT Frequency: Min 2X/week   Barriers to D/C: Decreased caregiver support  husband works for a few hours during the day       Co-evaluation              End of Session    Activity Tolerance: Patient tolerated treatment well Patient left: in bed;with call bell/phone within reach;with bed alarm set;with family/visitor present   Time: [id number] OT Time Calculation (min): 23 min Charges:  OT General Charges $OT Visit: 1 Procedure OT Evaluation $OT Eval Moderate Complexity: 1 Procedure OT Treatments $Self Care/Home Management : 8-22 mins G-Codes: OT G-codes **NOT FOR INPATIENT CLASS** Functional Assessment Tool Used: Clinical judgement Functional Limitation: Self care Self Care Current Status [id number]): At least 1 percent but less than 20 percent impaired, limited or restricted Self Care Goal Status [id number]): At least 1 percent but less than 20 percent impaired, limited or restricted   Binnie Kand M.S., OTR/L Pager: [phone]  07/31/2015, 3:48 PM

## 2015-07-31 NOTE — Progress Notes (Signed)
   07/31/15 0900  SLP G-Codes **NOT FOR INPATIENT CLASS**  Functional Assessment Tool Used (clinical judgement)  Functional Limitations Swallowing  Swallow Current Status [id number]) CK  Swallow Goal Status [id number]) CK  Swallow Discharge Status [id number]) CK  SLP Evaluations  $ SLP Speech Visit 1 Procedure  SLP Evaluations  $BSS Swallow 1 Procedure  $Swallowing Treatment 1 Procedure

## 2015-07-31 NOTE — Progress Notes (Signed)
Password for family:  [place]

## 2015-07-31 NOTE — Progress Notes (Signed)
Pt back on floor. Tele placed back on. rn assisted pt to bedpan.

## 2015-07-31 NOTE — Progress Notes (Signed)
Pt going down to surgery. Informed consent signed by pt and her husband. Pt has hx of dementia. Son is still at bedside, and has pts glasses.

## 2015-07-31 NOTE — Progress Notes (Signed)
Pt back from CT

## 2015-07-31 NOTE — Progress Notes (Signed)
Kyung Rudd- husband  [phone]  Ronalee Belts- son [phone]

## 2015-07-31 NOTE — Progress Notes (Signed)
Pharmacy Antibiotic Note  Pamela Kidd is a 70 y.o. female admitted on 07/30/2015 with pneumonia.  Pharmacy has been consulted for antibiotic dosing for renal adjustment (levaquin and flagyl). Pt with Stage II CKD, dementia, CAD.  Plan: Continue metronidazole 500 mg IV q8h Continue levofloxacin 500 q48h Monitor clinical picture, SCr daily   Height: 4\' 10"  (147.3 cm) Weight: 96 lb 12.8 oz (43.908 kg) IBW/kg (Calculated) : 40.9  Temp (24hrs), Avg:98.8 F (37.1 C), Min:98.1 F (36.7 C), Max:100.7 F (38.2 C)   Recent Labs Lab 07/30/15 1435 07/30/15 1451 07/30/15 1915  WBC  --  9.8  --   CREATININE 1.40* 1.33*  --   LATICACIDVEN  --   --  1.13    Estimated Creatinine Clearance: 25.4 mL/min (by C-G formula based on Cr of 1.33).    Allergies  Allergen Reactions  . Ampicillin Itching and Dermatitis  . Cisapride     Other reaction(s): Other (See Comments) Unknown  . Codeine Itching and Nausea And Vomiting    Other reaction(s): GI Upset (intolerance)  . Nubain [Nalbuphine Hcl] Other (See Comments)    "headache & disoriented"      Antimicrobials this admission: Levaquin 7/11>> Flagyl 7/11 >>  Dose adjustments this admission: none  Microbiology results: 7/11 BCx: sent   Thank you for allowing pharmacy to be a part of this patient's care.  Carlean Jews, PharmD PGY1 Pharmacy Resident 07/31/2015 10:28 AM

## 2015-07-31 NOTE — Progress Notes (Signed)
Patient ordered for 500 cc normal saline bolus .Bolus started.

## 2015-07-31 NOTE — Progress Notes (Signed)
rn followed up with cardiology about how long pt needed to be NPO. Pt is to remain NPO, possibly may have pacemaker placed tonight.

## 2015-07-31 NOTE — Progress Notes (Signed)
Pt to CT

## 2015-07-31 NOTE — Progress Notes (Signed)
Patient blood pressure down 74/38 HR 125.Text paged Forrest Moron NP.

## 2015-07-31 NOTE — Progress Notes (Signed)
Rapid response called to assess patient.Patient blood pressure 72/54 Hr 126.

## 2015-07-31 NOTE — Progress Notes (Signed)
500 cc normal saline bolus started.

## 2015-07-31 NOTE — Progress Notes (Signed)
Patient blood pressure 72/54 and hr 125 ekg shows rhythm changes.Text paged Forrest Moron NP.

## 2015-07-31 NOTE — Progress Notes (Addendum)
PROGRESS NOTE  Pamela Kidd  [id number] DOB: Feb 27, 1945  DOA: 07/30/2015 PCP: [name], MD   Brief Narrative:  70 year old female with history of moderate dementia, CAD status post stenting, HLD, oral cancer status post radiation and chemotherapy with residual chronic dysphagia, ischemic cardiomyopathy, HLD, anemia, GERD, married and lives with spouse, presented to ED on 07/30/15 with 2 day history of increasing sleepiness, difficulty ambulating, unsteady gait, confusion and speech difficulties. CT head and soft tissue of neck unremarkable. Chest x-ray suggested possible pneumonia. Patient has chronic unchanged cough. As per spouse, at least 3-4 episodes of syncope and a fall 1 month ago. Telemetry showed positive >8 seconds. Cardiology consulted.  Assessment & Plan:   Principal Problem:   Ataxia Active Problems:   Ischemic cardiomyopathy   CAD (coronary artery disease), native coronary artery   Oral cancer (HCC)   Dementia   CAP (community acquired pneumonia)   Confusion/altered mental status/ataxia - Unclear etiology. CT head without focal or acute findings. CT soft tissue neck: Negative for abscess in the neck, no mass or adenopathy. MR brain: No acute intracranial process. - TSH mildly elevated/chronically. Free T4 low normal. - No other metabolic abnormalities to explain confusion. Urine streptococcal antigen negative. - Mental status has returned to baseline per family at bedside.  Asystole (8.07 seconds at 11:30 PM on 07/30/15) - Does not appear that this was brought to the attention of M.D. Or APP - Probably asymptomatic. - Spouse gives history of multiple syncopal episodes which could be related to this. - Not on any rate control medications. TSH mildly elevated and we'll treat for subclinical hypothyroidism and initiated Synthroid this admission. - Cardiology consulted. Family interested in pursuing full code and pacemaker placement if needed.  Chronic  dysphagia - Speech therapy evaluation appreciated. Discussed at length with speech therapy. Patient declines feeding tubes (has had this in the past) and even declines modification and diet texture. Family aware of increased risk of aspiration despite all measures.  Possible aspiration pneumonia - Continue antibiotics: Levofloxacin and metronidazole.  CAD - Asymptomatic. Continue aspirin and statins.  Acute kidney injury - Presented with creatinine of 1.4. Improved after hydration to 1.04.  Anemia - Follow CBC in a.m.  Moderate dementia without behavioral disturbances & depression - Mental status has returned to baseline.  Hypothyroid - History of chemoradiation for oral cancer. TSH mildly elevated. Free T4 low. Start Synthroid at 12.5 g daily. Follow TSH in 4-6 weeks.  Left flank pain -Seems to have resolved. CT abdomen and pelvis confirmed constipation but no other acute findings. X-ray of the pelvis without acute fracture.  B12 deficiency - B12 level >6K. Hold B12 supplements for now.   Hypotension - Asymptomatic. Treat with IV normal saline bolus and IV fluids. Check random cortisol.  Constipation - Bowel regimen.   DVT prophylaxis: Lovenox Code Status: Full- confirmed with patient's spouse and son in the presence of speech therapist at patient's bedside.  Family Communication: Discussed with spouse and son at bedside. Updated care and answered questions. Disposition Plan: To be determined.    Consultants:   Cardiology  Procedures:   None  Antimicrobials:   Levofloxacin  Metronidazole    Subjective: Pleasantly confused. Denies complaints. Denies pain-apparently had left ankle pain couple days ago. As per family at bedside, mental status back to baseline.   Objective:  Filed Vitals:   07/31/15 1755 07/31/15 1759 07/31/15 1810 07/31/15 1835  BP: 87/58  99/61 77/52  Pulse: 98 0 75 70  Temp:  TempSrc:      Resp: 22 63 23 16  Height:        Weight:      SpO2: 100% 0% 98% 98%    Intake/Output Summary (Last 24 hours) at 07/31/15 1844 Last data filed at 07/30/15 2217  Gross per 24 hour  Intake    500 ml  Output    250 ml  Net    250 ml   Filed Weights   07/30/15 1424 07/30/15 2248  Weight: 36.288 kg (80 lb) 43.908 kg (96 lb 12.8 oz)    Examination:  General exam: Small built and thinly nourished pleasant elderly female sitting up comfortably in bed. Oral mucosa moist.  Respiratory system: reduced breath sounds in the bases. Rest of lung fields clear to auscultation. Respiratory effort normal. Cardiovascular system: S1 & S2 heard, RRR. No JVD, murmurs, rubs, gallops or clicks. No pedal edema. telemetry: Sinus rhythm. Bradycardia with sinus pauses of 8.07 seconds at 11:30 PM on 7/11.  Gastrointestinal system: Abdomen is nondistended, soft and nontender. No organomegaly or masses felt. Normal bowel sounds heard. Central nervous system: Alert and oriented only to self and partly to place . No focal neurological deficits. Extremities: Symmetric 5 x 5 power. Left ankle without acute findings.  Skin: No rashes, lesions or ulcers Psychiatry: Judgement and insight appear impaired. Mood & affect appropriate.     Data Reviewed: I have personally reviewed following labs and imaging studies  CBC:  Recent Labs Lab 07/30/15 1435 07/30/15 1451 07/31/15 1330  WBC  --  9.8 9.2  NEUTROABS  --  8.6*  --   HGB 13.3 12.9 10.7*  HCT 39.0 40.1 33.7*  MCV  --  90.3 89.2  PLT  --  231 [id number]   Basic Metabolic Panel:  Recent Labs Lab 07/30/15 1435 07/30/15 1451 07/31/15 1121  NA 134* 134* 135  K 4.0 4.0 3.8  CL 103 101 108  CO2  --  24 18*  GLUCOSE 108* 115* 84  BUN 24* 21* 23*  CREATININE 1.40* 1.33* 1.04*  CALCIUM  --  9.1 8.1*   GFR: Estimated Creatinine Clearance: 32.5 mL/min (by C-G formula based on Cr of 1.04). Liver Function Tests:  Recent Labs Lab 07/30/15 1451 07/31/15 1121  AST 24 21  ALT 15 13*   ALKPHOS 72 56  BILITOT 0.6 0.7  PROT 7.2 5.4*  ALBUMIN 3.6 2.6*   No results for input(s): LIPASE, AMYLASE in the last 168 hours. No results for input(s): AMMONIA in the last 168 hours. Coagulation Profile:  Recent Labs Lab 07/30/15 1451  INR 1.22   Cardiac Enzymes:  Recent Labs Lab 07/31/15 1121  TROPONINI <0.03   BNP (last 3 results) No results for input(s): PROBNP in the last 8760 hours. HbA1C: No results for input(s): HGBA1C in the last 72 hours. CBG: No results for input(s): GLUCAP in the last 168 hours. Lipid Profile:  Recent Labs  07/31/15 1121  CHOL 110  HDL 52  LDLCALC 38  TRIG 102  CHOLHDL 2.1   Thyroid Function Tests:  Recent Labs  07/31/15 1121 07/31/15 1338  TSH  --  4.318  FREET4 0.69  --    Anemia Panel:  Recent Labs  07/31/15 0031  VITAMINB12 6903*    Sepsis Labs:  Recent Labs Lab 07/30/15 1915 07/31/15 1121  LATICACIDVEN 1.13 0.7    Recent Results (from the past 240 hour(s))  Culture, blood (Routine X 2) w Reflex to ID Panel  Status: None (Preliminary result)   Collection Time: 07/30/15  6:55 PM  Result Value Ref Range Status   Specimen Description BLOOD RIGHT FOREARM  Final   Special Requests BOTTLES DRAWN AEROBIC AND ANAEROBIC 5CC  Final   Culture NO GROWTH < 24 HOURS  Final   Report Status PENDING  Incomplete  Culture, blood (Routine X 2) w Reflex to ID Panel     Status: None (Preliminary result)   Collection Time: 07/30/15  7:57 PM  Result Value Ref Range Status   Specimen Description BLOOD LEFT HAND  Final   Special Requests IN PEDIATRIC BOTTLE 2CC  Final   Culture NO GROWTH < 24 HOURS  Final   Report Status PENDING  Incomplete         Radiology Studies: Ct Abdomen Pelvis Wo Contrast  07/31/2015  CLINICAL DATA:  Pt c/o back and left flank pain s/p fall Unable to recall when fall occurred Hx of dementia, hiatal hernia, diverticulosis, cholecystectomy, hysterectomy EXAM: CT ABDOMEN AND PELVIS WITHOUT  CONTRAST TECHNIQUE: Multidetector CT imaging of the abdomen and pelvis was performed following the standard protocol without IV contrast. COMPARISON:  None. FINDINGS: Lower chest: Small bilateral pleural effusions. Minimal consolidation noted at both lung bases, left greater than right. Coronary arterial calcification noted. Hepatobiliary: No focal abnormality identified within the liver. Status post cholecystectomy. Pancreas: Unremarkable. Spleen: Normal in appearance. Adrenals/Urinary Tract: Normal appearance of the adrenal glands. Contrast is identified within the collecting systems and ureters. Kidneys are symmetric in size without focal renal mass. Stomach/Bowel: The stomach is normal in appearance. The appendix is well seen and has a normal appearance. Small bowel loops are normal in appearance. Significant amount stool is identified throughout colonic loops. Colonic diverticula are present. No associated inflammation. Vascular/Lymphatic: There is atherosclerotic calcification of the abdominal aorta. Two saccular aneurysms are identified in the infrarenal course, measuring 2.2 and 2.1 cm. No retroperitoneal or mesenteric adenopathy. Reproductive: Status post hysterectomy. No adnexal mass. No free pelvic fluid. Other: Subcutaneous gas identified in the right anterior abdominal wall likely at site of recent injection. Musculoskeletal: No acute fracture or subluxation. Degenerative changes are seen in lumbar spine and both hips. IMPRESSION: 1. Small bilateral pleural effusions and minimal bibasilar consolidation, left greater than right. 2. Coronary artery disease. 3. No evidence for abdominal injury. 4. Significant stool burden. 5. Colonic diverticulosis. 6. 2 saccular infrarenal abdominal aortic aneurysms measuring 2.2 and 2.1 cm. 7. Status post hysterectomy. 8. Status post cholecystectomy. 9. Probable injection site right anterior abdominal wall. 10. No acute fracture. Electronically Signed   By: Nolon Nations M.D.   On: 07/31/2015 09:20   Dg Chest 2 View  07/30/2015  CLINICAL DATA:  Fever today EXAM: CHEST  2 VIEW COMPARISON:  04/14/2014 FINDINGS: Cardiomediastinal silhouette is stable. Mild hyperinflation. No pleural effusion. There is left base posterior retrocardiac nodular consolidation best seen on lateral view. Pneumonia cannot be excluded. Follow-up to resolution is recommended. No pulmonary edema. Mild levoscoliosis lower thoracic spine. Thoracic spine osteopenia. IMPRESSION: Mild hyperinflation. No pleural effusion. There is left base posterior retrocardiac nodular consolidation best seen on lateral view. Pneumonia cannot be excluded. Follow-up to resolution is recommended. Electronically Signed   By: Lahoma Crocker M.D.   On: 07/30/2015 19:25   Ct Head Wo Contrast  07/30/2015  CLINICAL DATA:  Slurred speech. Unsteady gait. Dizziness. Symptoms began yesterday. Patient has dementia. EXAM: CT HEAD WITHOUT CONTRAST TECHNIQUE: Contiguous axial images were obtained from the base of the skull through the  vertex without intravenous contrast. COMPARISON:  12/23/2012 FINDINGS: The brain shows generalized atrophy. There is no evidence of old or acute small or large vessel infarction. No mass lesion, hemorrhage, hydrocephalus or extra-axial collection. Calvarium is unremarkable. Sinuses, middle ears and mastoids are clear. IMPRESSION: Atrophy without focal or acute finding. Electronically Signed   By: Nelson Chimes M.D.   On: 07/30/2015 16:08   Ct Soft Tissue Neck W Contrast  07/30/2015  CLINICAL DATA:  Fever.  Mouth and neck pain. EXAM: CT NECK WITH CONTRAST TECHNIQUE: Multidetector CT imaging of the neck was performed using the standard protocol following the bolus administration of intravenous contrast. CONTRAST:  35mL ISOVUE-300 IOPAMIDOL (ISOVUE-300) INJECTION 61% COMPARISON:  None. FINDINGS: Pharynx and larynx: Normal pharynx. Tongue is normal. No evidence of mass or abscess in the pharynx. Atrophic  tonsils. Epiglottis and larynx normal. Salivary glands: Atrophic submandibular gland bilaterally. Atrophic parotid gland bilaterally. Thyroid: Negative Lymph nodes: No pathologic adenopathy in the neck. Vascular: Mild carotid artery calcification bilaterally. Carotid artery and jugular vein patent bilaterally. Limited intracranial: Negative Visualized orbits: Negative Mastoids and visualized paranasal sinuses: Negative Skeleton: Mild to moderate cervical degenerative change with disc and facet degeneration. No fracture or bone lesion. Patient is nearly edentulous. Upper anterior teeth show caries. No periapical abscess. Upper chest: Apical scarring bilaterally. IMPRESSION: Negative for abscess in the neck. No mass or adenopathy Dental caries upper anterior teeth without periapical abscess. Electronically Signed   By: Franchot Gallo M.D.   On: 07/30/2015 20:23   Mr Brain Wo Contrast  07/31/2015  CLINICAL DATA:  Unsteady gait, speech difficulty beginning last night. EXAM: MRI HEAD WITHOUT CONTRAST TECHNIQUE: Multiplanar, multiecho pulse sequences of the brain and surrounding structures were obtained without intravenous contrast. COMPARISON:  CT HEAD July 30, 2015 FINDINGS: INTRACRANIAL CONTENTS: No reduced diffusion to suggest acute ischemia. No susceptibility artifact to suggest hemorrhage. Mild ventriculomegaly, on the basis of global parenchymal brain volume loss. Scattered subcentimeter supratentorial white matter FLAIR T2 hyperintensities most commonly seen with chronic small vessel ischemic disease, normal for age. No suspicious parenchymal signal, masses or mass effect. No abnormal extra-axial fluid collections. No extra-axial masses though, contrast enhanced sequences would be more sensitive. Normal major intracranial vascular flow voids present at skull base. ORBITS: The included ocular globes and orbital contents are non-suspicious. SINUSES: The mastoid air-cells and included paranasal sinuses are  well-aerated. SKULL/SOFT TISSUES: No abnormal sellar expansion. No suspicious calvarial bone marrow signal. Craniocervical junction maintained. Atrophic adenoidal soft tissues and fatty replaced parotid glands, query prior radiation. Patient is edentulous. IMPRESSION: No acute intracranial process. Mild global parenchymal brain volume loss and mild chronic small vessel ischemic disease. Electronically Signed   By: Elon Alas M.D.   On: 07/31/2015 06:52   Dg Pelvis Portable  07/31/2015  CLINICAL DATA:  Recent fall, patient reports shortness in the pelvis and hips EXAM: PORTABLE PELVIS 1-2 VIEWS COMPARISON:  None in PACs FINDINGS: The bones are subjectively osteopenic. No acute or healing pelvic fracture is observed. There is mild degenerative change of both hips greater on the right. No acute hip fracture is demonstrated. The femoral necks and intertrochanteric and subtrochanteric regions appear normal. IMPRESSION: There is mild degenerative change of both hips greatest on the right. There is no acute fracture. If there are strong clinical concerns of a hip fracture, a dedicated hip x-ray series would be recommended. Electronically Signed   By: David  Martinique M.D.   On: 07/31/2015 08:00        Scheduled  Meds: . [MAR Hold] aspirin  300 mg Rectal Daily   Or  . [MAR Hold] aspirin  325 mg Oral Daily  . [MAR Hold] enoxaparin (LOVENOX) injection  30 mg Subcutaneous QHS  . [MAR Hold] folic acid  1 mg Oral Daily  . [MAR Hold] levofloxacin (LEVAQUIN) IV  500 mg Intravenous Q48H  . [MAR Hold] levothyroxine  12.5 mcg Oral QAC breakfast  . [MAR Hold] megestrol  400 mg Oral Daily  . [MAR Hold] metronidazole  500 mg Intravenous Q8H  . [MAR Hold] mirtazapine  30 mg Oral QHS  . [MAR Hold] pantoprazole  40 mg Oral QAC breakfast  . [MAR Hold] rosuvastatin  10 mg Oral q1800  . [MAR Hold] sertraline  100 mg Oral Daily   Continuous Infusions: . sodium chloride 250 mL (07/31/15 1722)        Time  spent: 45 minutes.    [facility], MD [facility] Pager [phone]  If 7PM-7AM, please contact night-coverage [email or web address] Password [facility] 07/31/2015, 6:44 PM

## 2015-07-31 NOTE — Progress Notes (Signed)
Rn has explained to pt at least 5 times that pt is not allowed to have anything by mouth until she has a speech evaluation. Pt has been given twice small amount of water and green swab to swab her mouth with. Pt instructed not to drink water. Family was at bedside when rn was giving these instructions and gave cup of water to visitor.

## 2015-08-01 ENCOUNTER — Observation Stay (HOSPITAL_COMMUNITY): Payer: PPO

## 2015-08-01 ENCOUNTER — Inpatient Hospital Stay (HOSPITAL_COMMUNITY): Payer: PPO

## 2015-08-01 ENCOUNTER — Encounter (HOSPITAL_COMMUNITY): Payer: Self-pay | Admitting: Internal Medicine

## 2015-08-01 DIAGNOSIS — R001 Bradycardia, unspecified: Secondary | ICD-10-CM | POA: Diagnosis not present

## 2015-08-01 DIAGNOSIS — E46 Unspecified protein-calorie malnutrition: Secondary | ICD-10-CM | POA: Diagnosis not present

## 2015-08-01 DIAGNOSIS — I2510 Atherosclerotic heart disease of native coronary artery without angina pectoris: Secondary | ICD-10-CM

## 2015-08-01 DIAGNOSIS — F05 Delirium due to known physiological condition: Secondary | ICD-10-CM | POA: Diagnosis not present

## 2015-08-01 DIAGNOSIS — J189 Pneumonia, unspecified organism: Secondary | ICD-10-CM | POA: Diagnosis not present

## 2015-08-01 DIAGNOSIS — E785 Hyperlipidemia, unspecified: Secondary | ICD-10-CM | POA: Diagnosis not present

## 2015-08-01 DIAGNOSIS — Z681 Body mass index (BMI) 19 or less, adult: Secondary | ICD-10-CM | POA: Diagnosis not present

## 2015-08-01 DIAGNOSIS — R270 Ataxia, unspecified: Secondary | ICD-10-CM | POA: Diagnosis not present

## 2015-08-01 DIAGNOSIS — R1319 Other dysphagia: Secondary | ICD-10-CM | POA: Diagnosis not present

## 2015-08-01 DIAGNOSIS — L89152 Pressure ulcer of sacral region, stage 2: Secondary | ICD-10-CM | POA: Diagnosis not present

## 2015-08-01 DIAGNOSIS — F329 Major depressive disorder, single episode, unspecified: Secondary | ICD-10-CM | POA: Diagnosis not present

## 2015-08-01 DIAGNOSIS — E538 Deficiency of other specified B group vitamins: Secondary | ICD-10-CM | POA: Diagnosis not present

## 2015-08-01 DIAGNOSIS — L8990 Pressure ulcer of unspecified site, unspecified stage: Secondary | ICD-10-CM | POA: Insufficient documentation

## 2015-08-01 DIAGNOSIS — K219 Gastro-esophageal reflux disease without esophagitis: Secondary | ICD-10-CM | POA: Diagnosis not present

## 2015-08-01 DIAGNOSIS — R4701 Aphasia: Secondary | ICD-10-CM | POA: Diagnosis not present

## 2015-08-01 DIAGNOSIS — R1310 Dysphagia, unspecified: Secondary | ICD-10-CM | POA: Diagnosis not present

## 2015-08-01 DIAGNOSIS — I255 Ischemic cardiomyopathy: Secondary | ICD-10-CM | POA: Diagnosis not present

## 2015-08-01 DIAGNOSIS — I9589 Other hypotension: Secondary | ICD-10-CM | POA: Diagnosis not present

## 2015-08-01 DIAGNOSIS — S3991XA Unspecified injury of abdomen, initial encounter: Secondary | ICD-10-CM | POA: Diagnosis not present

## 2015-08-01 DIAGNOSIS — I455 Other specified heart block: Secondary | ICD-10-CM | POA: Diagnosis not present

## 2015-08-01 DIAGNOSIS — R42 Dizziness and giddiness: Secondary | ICD-10-CM | POA: Diagnosis not present

## 2015-08-01 DIAGNOSIS — R509 Fever, unspecified: Secondary | ICD-10-CM | POA: Diagnosis not present

## 2015-08-01 DIAGNOSIS — I4891 Unspecified atrial fibrillation: Secondary | ICD-10-CM | POA: Diagnosis not present

## 2015-08-01 DIAGNOSIS — N182 Chronic kidney disease, stage 2 (mild): Secondary | ICD-10-CM | POA: Diagnosis not present

## 2015-08-01 DIAGNOSIS — Z950 Presence of cardiac pacemaker: Secondary | ICD-10-CM | POA: Diagnosis not present

## 2015-08-01 DIAGNOSIS — J439 Emphysema, unspecified: Secondary | ICD-10-CM | POA: Diagnosis not present

## 2015-08-01 DIAGNOSIS — M542 Cervicalgia: Secondary | ICD-10-CM | POA: Diagnosis not present

## 2015-08-01 DIAGNOSIS — S3993XA Unspecified injury of pelvis, initial encounter: Secondary | ICD-10-CM | POA: Diagnosis not present

## 2015-08-01 DIAGNOSIS — R269 Unspecified abnormalities of gait and mobility: Secondary | ICD-10-CM | POA: Diagnosis not present

## 2015-08-01 DIAGNOSIS — N179 Acute kidney failure, unspecified: Secondary | ICD-10-CM | POA: Diagnosis not present

## 2015-08-01 DIAGNOSIS — D649 Anemia, unspecified: Secondary | ICD-10-CM | POA: Diagnosis not present

## 2015-08-01 DIAGNOSIS — E876 Hypokalemia: Secondary | ICD-10-CM | POA: Diagnosis not present

## 2015-08-01 DIAGNOSIS — F0390 Unspecified dementia without behavioral disturbance: Secondary | ICD-10-CM | POA: Diagnosis not present

## 2015-08-01 DIAGNOSIS — R109 Unspecified abdominal pain: Secondary | ICD-10-CM | POA: Diagnosis not present

## 2015-08-01 DIAGNOSIS — E039 Hypothyroidism, unspecified: Secondary | ICD-10-CM | POA: Diagnosis not present

## 2015-08-01 DIAGNOSIS — J690 Pneumonitis due to inhalation of food and vomit: Secondary | ICD-10-CM | POA: Diagnosis not present

## 2015-08-01 DIAGNOSIS — H9190 Unspecified hearing loss, unspecified ear: Secondary | ICD-10-CM | POA: Diagnosis not present

## 2015-08-01 DIAGNOSIS — I495 Sick sinus syndrome: Secondary | ICD-10-CM | POA: Diagnosis not present

## 2015-08-01 LAB — BASIC METABOLIC PANEL
ANION GAP: 5 (ref 5–15)
BUN: 15 mg/dL (ref 6–20)
CALCIUM: 7.3 mg/dL — AB (ref 8.9–10.3)
CO2: 18 mmol/L — AB (ref 22–32)
Chloride: 114 mmol/L — ABNORMAL HIGH (ref 101–111)
Creatinine, Ser: 0.86 mg/dL (ref 0.44–1.00)
GFR calc Af Amer: >60 mL/min (ref >60)
GFR calc non Af Amer: >60 mL/min (ref >60)
GLUCOSE: 78 mg/dL (ref 65–99)
Potassium: 3.5 mmol/L (ref 3.5–5.1)
Sodium: 137 mmol/L (ref 135–145)

## 2015-08-01 LAB — ECHOCARDIOGRAM COMPLETE
CHL CUP DOP CALC LVOT VTI: 23.2 cm
CHL CUP MV DEC (S): 246
EWDT: 246 ms
FS: 22 % — AB (ref 28–44)
HEIGHTINCHES: 58 in
IV/PV OW: 1
LA ID, A-P, ES: 28 mm
LA diam end sys: 28 mm
LADIAMINDEX: 2.09 cm/m2
LAVOL: 29.6 mL
LAVOLA4C: 19.2 mL
LAVOLIN: 22.1 mL/m2
LV PW d: 7 mm — AB (ref 0.6–1.1)
LV TDI E'LATERAL: 9.14
LV TDI E'MEDIAL: 9.68
LVELAT: 9.14 cm/s
LVOT area: 2.84 cm2
LVOT peak grad rest: 6 mmHg
LVOT peak vel: 120 cm/s
LVOTD: 19 mm
LVOTSV: 66 mL
MV pk E vel: 1.1 m/s
Reg peak vel: 225 cm/s
TRMAXVEL: 225 cm/s
Weight: 1548.8 oz

## 2015-08-01 LAB — CBC
HEMATOCRIT: 31.2 % — AB (ref 36.0–46.0)
Hemoglobin: 10.1 g/dL — ABNORMAL LOW (ref 12.0–15.0)
MCH: 28.7 pg (ref 26.0–34.0)
MCHC: 32.4 g/dL (ref 30.0–36.0)
MCV: 88.6 fL (ref 78.0–100.0)
Platelets: 187 10*3/uL (ref 150–400)
RBC: 3.52 MIL/uL — ABNORMAL LOW (ref 3.87–5.11)
RDW: 13.7 % (ref 11.5–15.5)
WBC: 7.8 10*3/uL (ref 4.0–10.5)

## 2015-08-01 LAB — GLUCOSE, CAPILLARY
GLUCOSE-CAPILLARY: 97 mg/dL (ref 65–99)
GLUCOSE-CAPILLARY: 146 mg/dL — AB (ref 65–99)
Glucose-Capillary: 114 mg/dL — ABNORMAL HIGH (ref 65–99)
Glucose-Capillary: 190 mg/dL — ABNORMAL HIGH (ref 65–99)

## 2015-08-01 LAB — HEMOGLOBIN A1C
Hgb A1c MFr Bld: 5.7 % — ABNORMAL HIGH (ref 4.8–5.6)
Mean Plasma Glucose: 117 mg/dL

## 2015-08-01 LAB — LEGIONELLA PNEUMOPHILA SEROGP 1 UR AG: L. PNEUMOPHILA SEROGP 1 UR AG: NEGATIVE

## 2015-08-01 LAB — FOLATE RBC
FOLATE, RBC: 1728 ng/mL (ref >498)
Folate, Hemolysate: 558.1 ng/mL
Hematocrit: 32.3 % — ABNORMAL LOW (ref 34.0–46.6)

## 2015-08-01 MED ORDER — SODIUM CHLORIDE 0.9 % IV BOLUS (SEPSIS)
500.0000 mL | Freq: Once | INTRAVENOUS | Status: AC
Start: 2015-08-01 — End: 2015-08-01
  Administered 2015-08-01: 500 mL via INTRAVENOUS

## 2015-08-01 MED ORDER — SODIUM CHLORIDE 0.9 % IV SOLN: INTRAVENOUS | Status: DC

## 2015-08-01 NOTE — Progress Notes (Signed)
After second 500 cc normal saline bolus patient blood pressure 77/54 and heart rate 63 sinus rhythm.Patient asymptomatic resting comfortably in bed.Text paged Forrest Moron NP.

## 2015-08-01 NOTE — Progress Notes (Addendum)
Shift event: Pt is s/p PPM placement last evening. RN, Webb Silversmith, paged because pt had hypotension and conversion to Afib with RVR. 12 lead EKG confirmed this. BP too low to treat RVR with med. Two, 1/2 liter boluses were given. Afterwards, pt was back in NSR, but BP still low in the 70s. Pt was alert and resting. Non symptomatic. Another 1/2 Liter bolus was ordered and NS at 100cc/hr started for 8 hours. If BP doesn't respond, or if pt becomes symptomatic, consider TF to SDU. Future plan depends on pt's response to orders.  KJKG, NP [facility] Update: HR in the 60s, NSR. BP high 70s, however, looking back, it seems pt runs on the low side for BP. Pt is lying in bed resting without c/o. RN has started the maintenance fluids. Will follow. KJKG, NP [facility]

## 2015-08-01 NOTE — Progress Notes (Signed)
SUBJECTIVE: The patient is feeling well today.  At this time, she denies chest pain, shortness of breath, or any new concerns.  Marland Kitchen aspirin  300 mg Rectal Daily   Or  . aspirin  325 mg Oral Daily  . folic acid  1 mg Oral Daily  . levofloxacin (LEVAQUIN) IV  500 mg Intravenous Q48H  . levothyroxine  12.5 mcg Oral QAC breakfast  . megestrol  400 mg Oral Daily  . metronidazole  500 mg Intravenous Q8H  . mirtazapine  30 mg Oral QHS  . pantoprazole  40 mg Oral QAC breakfast  . polyethylene glycol  17 g Oral Daily  . rosuvastatin  10 mg Oral q1800  . senna-docusate  2 tablet Oral QHS  . sertraline  100 mg Oral Daily   . sodium chloride 100 mL/hr at 08/01/15 0128    OBJECTIVE: Physical Exam: Filed Vitals:   08/01/15 0030 08/01/15 0127 08/01/15 0345 08/01/15 0620  BP: 77/54 78/57 79/56  85/64  Pulse:    110  Temp:   97.9 F (36.6 C) 97.5 F (36.4 C)  TempSrc:   Oral Oral  Resp:   18 18  Height:      Weight:      SpO2:   99% 98%    Intake/Output Summary (Last 24 hours) at 08/01/15 0755 Last data filed at 08/01/15 [id number]  Gross per 24 hour  Intake    240 ml  Output      0 ml  Net    240 ml    Telemetry reveals SR, PAT last PM  GEN- The patient is frail/very thin body habitus, in NAD, wakes easily and is conversational, oriented to self  Head- normocephalic, atraumatic Eyes-  Sclera clear, conjunctiva pink Ears- hearing intact Oropharynx- poor dentitian Neck- supple, no JVP Lungs- Clear to ausculation bilaterally, normal work of breathing no rales/crackles are appreciated, no hematoma or ecchymosis around PM incision. Heart- Regular rate and rhythm, no significant murmurs, no rubs or gallops GI- soft, NT, ND Extremities- no clubbing, cyanosis, or edema Skin- no rash or lesion, warm/dry Psych- euthymic mood, full affect Neuro- no gross deficits appreciated  Pacer site dressing is dry, clean, no hematoma  LABS: Basic Metabolic Panel:  Recent Labs  07/30/15 1451  07/31/15 1121  NA 134* 135  K 4.0 3.8  CL 101 108  CO2 24 18*  GLUCOSE 115* 84  BUN 21* 23*  CREATININE 1.33* 1.04*  CALCIUM 9.1 8.1*   Liver Function Tests:  Recent Labs  07/30/15 1451 07/31/15 1121  AST 24 21  ALT 15 13*  ALKPHOS 72 56  BILITOT 0.6 0.7  PROT 7.2 5.4*  ALBUMIN 3.6 2.6*   No results for input(s): LIPASE, AMYLASE in the last 72 hours. CBC:  Recent Labs  07/30/15 1451 07/31/15 1330  WBC 9.8 9.2  NEUTROABS 8.6*  --   HGB 12.9 10.7*  HCT 40.1 33.7*  MCV 90.3 89.2  PLT 231 191   Cardiac Enzymes:  Recent Labs  07/31/15 1121  TROPONINI <0.03   Hemoglobin A1C:  Recent Labs  07/31/15 1121  HGBA1C 5.7*   Fasting Lipid Panel:  Recent Labs  07/31/15 1121  CHOL 110  HDL 52  LDLCALC 38  TRIG 102  CHOLHDL 2.1   Thyroid Function Tests:  Recent Labs  07/31/15 1338  TSH 4.318   Anemia Panel:  Recent Labs  07/31/15 0031  VITAMINB12 6903*    RADIOLOGY: 08/01/15 CXR IMPRESSION: 1. Interval placement of  left anterior chest wall dual lead pacemaker without evidence of complication. Specifically, no evidence of pneumothorax. 2. Development of trace bilateral effusions with associated worsening bibasilar atelectasis. No definite evidence of edema. 3. Emphysema. (ICD10-J43.9) 4. Aortic Atherosclerosis (ICD10-170.0)  ASSESSMENT AND PLAN:   1. Asystolic event 0000000 seconds   Recent new history of unprovoked syncopal events  No rate limiting or nodal blocking meds here or at home      TSH wnl yesterday  s/p PPM implant yesterday with Dr. Lovena Le      cxr this morning without pneumothorax      Device check this morning with normal function      Echo is pending this morning      Pacer site is dry, no hematoma, to remove outer bandage upon discharge      Wound care and activity restriction discussed with the patient, no family at bedside written instruction have been placed in the patient's AVS      Wound check  appointment is made, patient will be called for her 3 month visit, I have sent a staff message to the scheduler      Tachycardia last evening was PAT by her device, patient denies palpitations  2. Ataxia, slurred speech, confusion     W/u and management with attending MD 3. Low grade temp on admit     pneumonia  remains afebrile since admit temp     nml WBC 4. CAD  No complaints  On ASA, statin 5. Hypotension     asymptomatic     Attending service is addressing, s/p IVF with CXR noted, no c/o SOB, sats good     Patient denies symptoms     Defer to attending MD ongoing management  Tommye Standard, PA-C 08/01/2015 7:55 AM  EP Attending  Patient seen and examined. Agree with above. Her PPM is functioning normally. CXR is ok. Stable for DC from cardiology perspective. We will arrange followup. Call for questions. ECG from last night is not atrial fib, rather NSR with PAC's.  Mikle Bosworth.D.

## 2015-08-01 NOTE — Progress Notes (Addendum)
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 CT head and soft tissue of neck unremarkable. Chest x-ray suggested possible pneumonia. Patient has chronic unchanged cough. As per spouse, at least 3-4 episodes of syncope and a fall 1 month ago. Telemetry showed positive >8 seconds. Cardiology consulted and pacemaker placed 7/12.  Assessment & Plan:   Principal Problem:   Ataxia Active Problems:   Ischemic cardiomyopathy   CAD (coronary artery disease), native coronary artery   Oral cancer (HCC)   Dementia   CAP (community acquired pneumonia)   Pressure ulcer   Confusion/altered mental status/ataxia - Unclear etiology. CT head without focal or acute findings. CT soft tissue neck: Negative for abscess in the neck, no mass or adenopathy. MR brain: No acute intracranial process. - TSH mildly elevated/chronically. Free T4 low normal. - No other metabolic abnormalities to explain confusion. Urine streptococcal antigen negative. - Mental status has returned to baseline per family  Asystole (8.07 seconds at 11:30 PM on 07/30/15) - Does not appear that this was brought to the attention of M.D. Or APP on the night of occurrence. - Spouse gives history of multiple syncopal episodes which could be related to this. - Not on any rate control medications. TSH mildly elevated and we'll treat for subclinical hypothyroidism and initiated Synthroid this admission. - Cardiology consulted, pacemaker placed on 7/12. EDP has seen today and  cleared for discharge.  Chronic dysphagia - Speech therapy evaluation appreciated. Discussed at length with speech therapy. Patient declines feeding tubes (has had this in the past) and even declines modification and diet texture. Family aware of increased risk of aspiration despite all measures.  Possible aspiration pneumonia - Continue antibiotics: Levofloxacin and metronidazole.  CAD - Asymptomatic. Continue aspirin and statins.  Acute kidney injury - Presented with creatinine of 1.4. Improved after hydration to 1.04.  Anemia - Stable.  Moderate dementia without behavioral disturbances & depression - Mental status has returned to baseline.  Hypothyroid - History of chemoradiation for oral cancer. TSH mildly elevated. Free T4 low. Start Synthroid at 12.5 g daily. Follow TSH in 4-6 weeks.  Left flank pain - resolved. CT abdomen and pelvis confirmed constipation but no other acute findings. X-ray of the pelvis without acute fracture.  B12 deficiency - B12 level >6K. Hold B12 supplements for now.   Hypotension, chronic - Asymptomatic. Treat with IV normal saline bolus and IV fluids. Check random cortisol.  Constipation - Bowel regimen started.  Chronic hypotension, asymptomatic - As per spouse, patient has had chronic low blood pressures. He checks using home BP monitor.SBPs range between 60s-90s. As per discussion with PT, patient ambulated with them and had no orthostatics and her systolic blood pressures stayed in the 80s and she was asymptomatic of same. Random cortisol: 9.9.  A. fib with RVR - Patient had a brief period of A. fib with RVR overnight 7/12 and reverted spontaneously to sinus rhythm. Discussed at length with patient's spouse and advised him that she is not a safe candidate for long-term anticoagulation. Discussed with Dr. Lovena Le, EP cardiology who agrees that she is not a candidate for anticoagulation  and may continue aspirin 81 MG daily for same. If she again  develops RVR, then will need cardiology to reassess regarding management-? Amiodarone.   DVT prophylaxis: Lovenox Code Status: Full- confirmed with patient's spouse and son.  Family Communication: Discussed with spouse. Updated care and answered questions. Disposition Plan: DC home possibly 7/14.   Consultants:   Cardiology  Procedures:   Permanent pacemaker placement on 07/31/15.  Antimicrobials:   Levofloxacin  Metronidazole    Subjective: Pleasantly confused. Denies complaints. No pain reported. Overnight events noted.  Objective:  Filed Vitals:   08/01/15 0345 08/01/15 0620 08/01/15 0945 08/01/15 1345  BP: 79/56 85/64 73/48  68/50  Pulse:  110 70 70  Temp: 97.9 F (36.6 C) 97.5 F (36.4 C) 97.9 F (36.6 C) 98.1 F (36.7 C)  TempSrc: Oral Oral Oral Oral  Resp: 18 18 18 18   Height:      Weight:      SpO2: 99% 98% 100% 96%    Intake/Output Summary (Last 24 hours) at 08/01/15 1440 Last data filed at 08/01/15 0900  Gross per 24 hour  Intake    360 ml  Output      0 ml  Net    360 ml   Filed Weights   07/30/15 1424 07/30/15 2248  Weight: 36.288 kg (80 lb) 43.908 kg (96 lb 12.8 oz)    Examination:  General exam: Small built and thinly nourished pleasant elderly female sitting up comfortably in bed. Oral mucosa moist.  Respiratory system: reduced breath sounds in the bases. Rest of lung fields clear to auscultation. Respiratory effort normal. Cardiovascular system: S1 & S2 heard, RRR. No JVD, murmurs, rubs, gallops or clicks. No pedal edema. telemetry: Sinus rhythm. Bradycardia with sinus pauses of 8.07 seconds at 11:30 PM on 7/11. Overnight 07/31/15: A. fib with RVR is-140s between 9:45 PM and 11:55 PM and since then reverted to sinus rhythm. Gastrointestinal system: Abdomen is nondistended, soft and nontender. No organomegaly or masses felt. Normal bowel sounds heard. Central nervous system: Alert and oriented only to self and partly to place . No focal  neurological deficits. Extremities: Symmetric 5 x 5 power. Left ankle without acute findings.  Skin: No rashes, lesions or ulcers Psychiatry: Judgement and insight appear impaired. Mood & affect appropriate.     Data Reviewed: I have personally reviewed following labs and imaging studies  CBC:  Recent Labs Lab 07/30/15 1435 07/30/15 1451 07/31/15 1330 08/01/15 0810  WBC  --  9.8 9.2 7.8  NEUTROABS  --  8.6*  --   --   HGB 13.3 12.9 10.7* 10.1*  HCT 39.0 40.1 33.7* 31.2*  MCV  --  90.3 89.2 88.6  PLT  --  231 191 [id number]   Basic Metabolic Panel:  Recent Labs Lab 07/30/15 1435 07/30/15 1451 07/31/15 1121 08/01/15 0810  NA 134* 134* 135 137  K 4.0 4.0 3.8 3.5  CL 103 101 108 114*  CO2  --  24 18* 18*  GLUCOSE 108* 115* 84 78  BUN 24* 21* 23* 15  CREATININE 1.40* 1.33* 1.04* 0.86  CALCIUM  --  9.1 8.1* 7.3*   GFR: Estimated Creatinine Clearance: 39.3 mL/min (by C-G formula based on Cr of 0.86). Liver Function Tests:  Recent Labs Lab 07/30/15 1451 07/31/15 1121  AST 24 21  ALT 15 13*  ALKPHOS 72 56  BILITOT 0.6 0.7  PROT 7.2 5.4*  ALBUMIN 3.6 2.6*   No results for input(s): LIPASE, AMYLASE in the last 168  hours. No results for input(s): AMMONIA in the last 168 hours. Coagulation Profile:  Recent Labs Lab 07/30/15 1451  INR 1.22   Cardiac Enzymes:  Recent Labs Lab 07/31/15 1121  TROPONINI <0.03   BNP (last 3 results) No results for input(s): PROBNP in the last 8760 hours. HbA1C:  Recent Labs  07/31/15 1121  HGBA1C 5.7*   CBG:  Recent Labs Lab 07/31/15 2240 08/01/15 0629 08/01/15 1141  GLUCAP 91 97 114*   Lipid Profile:  Recent Labs  07/31/15 1121  CHOL 110  HDL 52  LDLCALC 38  TRIG 102  CHOLHDL 2.1   Thyroid Function Tests:  Recent Labs  07/31/15 1121 07/31/15 1338  TSH  --  4.318  FREET4 0.69  --    Anemia Panel:  Recent Labs  07/31/15 0031  VITAMINB12 6903*    Sepsis Labs:  Recent Labs Lab 07/30/15 1915  07/31/15 1121  LATICACIDVEN 1.13 0.7    Recent Results (from the past 240 hour(s))  Culture, blood (Routine X 2) w Reflex to ID Panel     Status: None (Preliminary result)   Collection Time: 07/30/15  6:55 PM  Result Value Ref Range Status   Specimen Description BLOOD RIGHT FOREARM  Final   Special Requests BOTTLES DRAWN AEROBIC AND ANAEROBIC 5CC  Final   Culture NO GROWTH < 24 HOURS  Final   Report Status PENDING  Incomplete  Culture, blood (Routine X 2) w Reflex to ID Panel     Status: None (Preliminary result)   Collection Time: 07/30/15  7:57 PM  Result Value Ref Range Status   Specimen Description BLOOD LEFT HAND  Final   Special Requests IN PEDIATRIC BOTTLE 2CC  Final   Culture NO GROWTH < 24 HOURS  Final   Report Status PENDING  Incomplete         Radiology Studies: Ct Abdomen Pelvis Wo Contrast  07/31/2015  CLINICAL DATA:  Pt c/o back and left flank pain s/p fall Unable to recall when fall occurred Hx of dementia, hiatal hernia, diverticulosis, cholecystectomy, hysterectomy EXAM: CT ABDOMEN AND PELVIS WITHOUT CONTRAST TECHNIQUE: Multidetector CT imaging of the abdomen and pelvis was performed following the standard protocol without IV contrast. COMPARISON:  None. FINDINGS: Lower chest: Small bilateral pleural effusions. Minimal consolidation noted at both lung bases, left greater than right. Coronary arterial calcification noted. Hepatobiliary: No focal abnormality identified within the liver. Status post cholecystectomy. Pancreas: Unremarkable. Spleen: Normal in appearance. Adrenals/Urinary Tract: Normal appearance of the adrenal glands. Contrast is identified within the collecting systems and ureters. Kidneys are symmetric in size without focal renal mass. Stomach/Bowel: The stomach is normal in appearance. The appendix is well seen and has a normal appearance. Small bowel loops are normal in appearance. Significant amount stool is identified throughout colonic loops. Colonic  diverticula are present. No associated inflammation. Vascular/Lymphatic: There is atherosclerotic calcification of the abdominal aorta. Two saccular aneurysms are identified in the infrarenal course, measuring 2.2 and 2.1 cm. No retroperitoneal or mesenteric adenopathy. Reproductive: Status post hysterectomy. No adnexal mass. No free pelvic fluid. Other: Subcutaneous gas identified in the right anterior abdominal wall likely at site of recent injection. Musculoskeletal: No acute fracture or subluxation. Degenerative changes are seen in lumbar spine and both hips. IMPRESSION: 1. Small bilateral pleural effusions and minimal bibasilar consolidation, left greater than right. 2. Coronary artery disease. 3. No evidence for abdominal injury. 4. Significant stool burden. 5. Colonic diverticulosis. 6. 2 saccular infrarenal abdominal aortic aneurysms measuring 2.2 and 2.1  cm. 7. Status post hysterectomy. 8. Status post cholecystectomy. 9. Probable injection site right anterior abdominal wall. 10. No acute fracture. Electronically Signed   By: Nolon Nations M.D.   On: 07/31/2015 09:20   Dg Chest 2 View  08/01/2015  CLINICAL DATA:  Pacemaker implantation EXAM: CHEST  2 VIEW COMPARISON:  07/30/2015; 04/14/2014 FINDINGS: Grossly unchanged cardiac silhouette and mediastinal contours given slightly reduced lung volumes. Atherosclerotic plaque within the thoracic aorta. Interval placement of left anterior chest wall dual lead pacemaker with lead tips overlying expected location of the right atrium and ventricle. The lungs remain hyperexpanded with thinning of the biapical pulmonary parenchyma and blunting of the bilateral costophrenic angles. Interval development of trace bilateral effusions with associated worsening bibasilar opacities, right greater than left, likely atelectasis. No definite evidence of edema. Osteopenia without acute osseous abnormalities. Post cholecystectomy. IMPRESSION: 1. Interval placement of left  anterior chest wall dual lead pacemaker without evidence of complication. Specifically, no evidence of pneumothorax. 2. Development of trace bilateral effusions with associated worsening bibasilar atelectasis. No definite evidence of edema. 3.  Emphysema. (ICD10-J43.9) 4.  Aortic Atherosclerosis (ICD10-170.0) Electronically Signed   By: Sandi Mariscal M.D.   On: 08/01/2015 07:48   Dg Chest 2 View  07/30/2015  CLINICAL DATA:  Fever today EXAM: CHEST  2 VIEW COMPARISON:  04/14/2014 FINDINGS: Cardiomediastinal silhouette is stable. Mild hyperinflation. No pleural effusion. There is left base posterior retrocardiac nodular consolidation best seen on lateral view. Pneumonia cannot be excluded. Follow-up to resolution is recommended. No pulmonary edema. Mild levoscoliosis lower thoracic spine. Thoracic spine osteopenia. IMPRESSION: Mild hyperinflation. No pleural effusion. There is left base posterior retrocardiac nodular consolidation best seen on lateral view. Pneumonia cannot be excluded. Follow-up to resolution is recommended. Electronically Signed   By: Lahoma Crocker M.D.   On: 07/30/2015 19:25   Ct Head Wo Contrast  07/30/2015  CLINICAL DATA:  Slurred speech. Unsteady gait. Dizziness. Symptoms began yesterday. Patient has dementia. EXAM: CT HEAD WITHOUT CONTRAST TECHNIQUE: Contiguous axial images were obtained from the base of the skull through the vertex without intravenous contrast. COMPARISON:  12/23/2012 FINDINGS: The brain shows generalized atrophy. There is no evidence of old or acute small or large vessel infarction. No mass lesion, hemorrhage, hydrocephalus or extra-axial collection. Calvarium is unremarkable. Sinuses, middle ears and mastoids are clear. IMPRESSION: Atrophy without focal or acute finding. Electronically Signed   By: Nelson Chimes M.D.   On: 07/30/2015 16:08   Ct Soft Tissue Neck W Contrast  07/30/2015  CLINICAL DATA:  Fever.  Mouth and neck pain. EXAM: CT NECK WITH CONTRAST TECHNIQUE:  Multidetector CT imaging of the neck was performed using the standard protocol following the bolus administration of intravenous contrast. CONTRAST:  22mL ISOVUE-300 IOPAMIDOL (ISOVUE-300) INJECTION 61% COMPARISON:  None. FINDINGS: Pharynx and larynx: Normal pharynx. Tongue is normal. No evidence of mass or abscess in the pharynx. Atrophic tonsils. Epiglottis and larynx normal. Salivary glands: Atrophic submandibular gland bilaterally. Atrophic parotid gland bilaterally. Thyroid: Negative Lymph nodes: No pathologic adenopathy in the neck. Vascular: Mild carotid artery calcification bilaterally. Carotid artery and jugular vein patent bilaterally. Limited intracranial: Negative Visualized orbits: Negative Mastoids and visualized paranasal sinuses: Negative Skeleton: Mild to moderate cervical degenerative change with disc and facet degeneration. No fracture or bone lesion. Patient is nearly edentulous. Upper anterior teeth show caries. No periapical abscess. Upper chest: Apical scarring bilaterally. IMPRESSION: Negative for abscess in the neck. No mass or adenopathy Dental caries upper anterior teeth without periapical abscess.  Electronically Signed   By: Franchot Gallo M.D.   On: 07/30/2015 20:23   Mr Brain Wo Contrast  07/31/2015  CLINICAL DATA:  Unsteady gait, speech difficulty beginning last night. EXAM: MRI HEAD WITHOUT CONTRAST TECHNIQUE: Multiplanar, multiecho pulse sequences of the brain and surrounding structures were obtained without intravenous contrast. COMPARISON:  CT HEAD July 30, 2015 FINDINGS: INTRACRANIAL CONTENTS: No reduced diffusion to suggest acute ischemia. No susceptibility artifact to suggest hemorrhage. Mild ventriculomegaly, on the basis of global parenchymal brain volume loss. Scattered subcentimeter supratentorial white matter FLAIR T2 hyperintensities most commonly seen with chronic small vessel ischemic disease, normal for age. No suspicious parenchymal signal, masses or mass effect. No  abnormal extra-axial fluid collections. No extra-axial masses though, contrast enhanced sequences would be more sensitive. Normal major intracranial vascular flow voids present at skull base. ORBITS: The included ocular globes and orbital contents are non-suspicious. SINUSES: The mastoid air-cells and included paranasal sinuses are well-aerated. SKULL/SOFT TISSUES: No abnormal sellar expansion. No suspicious calvarial bone marrow signal. Craniocervical junction maintained. Atrophic adenoidal soft tissues and fatty replaced parotid glands, query prior radiation. Patient is edentulous. IMPRESSION: No acute intracranial process. Mild global parenchymal brain volume loss and mild chronic small vessel ischemic disease. Electronically Signed   By: Elon Alas M.D.   On: 07/31/2015 06:52   Dg Pelvis Portable  07/31/2015  CLINICAL DATA:  Recent fall, patient reports shortness in the pelvis and hips EXAM: PORTABLE PELVIS 1-2 VIEWS COMPARISON:  None in PACs FINDINGS: The bones are subjectively osteopenic. No acute or healing pelvic fracture is observed. There is mild degenerative change of both hips greater on the right. No acute hip fracture is demonstrated. The femoral necks and intertrochanteric and subtrochanteric regions appear normal. IMPRESSION: There is mild degenerative change of both hips greatest on the right. There is no acute fracture. If there are strong clinical concerns of a hip fracture, a dedicated hip x-ray series would be recommended. Electronically Signed   By: David  Martinique M.D.   On: 07/31/2015 08:00        Scheduled Meds: . aspirin  300 mg Rectal Daily   Or  . aspirin  325 mg Oral Daily  . folic acid  1 mg Oral Daily  . levofloxacin (LEVAQUIN) IV  500 mg Intravenous Q48H  . levothyroxine  12.5 mcg Oral QAC breakfast  . megestrol  400 mg Oral Daily  . metronidazole  500 mg Intravenous Q8H  . mirtazapine  30 mg Oral QHS  . pantoprazole  40 mg Oral QAC breakfast  . polyethylene  glycol  17 g Oral Daily  . rosuvastatin  10 mg Oral q1800  . senna-docusate  2 tablet Oral QHS  . sertraline  100 mg Oral Daily   Continuous Infusions:     LOS: 0 days    Time spent: 45 minutes.    [facility], MD [facility] Pager [phone]  If 7PM-7AM, please contact night-coverage [email or web address] Password [email or web address] 08/01/2015, 2:40 PM

## 2015-08-01 NOTE — Progress Notes (Signed)
Echocardiogram 2D Echocardiogram has been performed.  Tresa Res 08/01/2015, 12:31 PM

## 2015-08-01 NOTE — Progress Notes (Signed)
Physical Therapy Treatment Patient Details Name: Pamela Kidd MRN: [id number] DOB: 07-Jun-1945 Today's Date: 08/01/2015    History of Present Illness Pamela Kidd is a 70 y.o. 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 Pt with possible PNA, mildly febrile, and left flank pain from fall that she does not remember. s/p pacemaker placement 7/14.     PT Comments    Patient progressing well towards PT goals. Continues to demonstrate impaired balance especially noted during higher level balance challenges. Ambulated without use of RW today but will try to find youth RW tomorrow to see if this helps with balance. Education on limited LUE use until this evening due to pacemaker placement.  Pt scored 12/24 on DGI putting pt at increased risk for falls. Recommend hands on assist at home for safety during mobility. BP continues to be low however pt asymptomatic.  Sitting EOB BP 82/58 Standing BP 85/56 Sitting EOB post session BP 82/60 Will continue to follow to maximize independence and mobility prior to return home.   Follow Up Recommendations  Home health PT;Supervision for mobility/OOB     Equipment Recommendations  Rolling walker with 5" wheels (youth height)    Recommendations for Other Services       Precautions / Restrictions Precautions Precautions: Fall Restrictions Weight Bearing Restrictions: No    Mobility  Bed Mobility Overal bed mobility: Needs Assistance Bed Mobility: Supine to Sit     Supine to sit: Supervision;HOB elevated     General bed mobility comments: Cues to roll towards right side due to new pacemaker placement. Use of rail to get to EOB. Supervision for safety.   Transfers Overall transfer level: Needs assistance Equipment used:  None Transfers: Sit to/from Stand Sit to Stand: Min guard         General transfer comment: Min guard for safety; no physical assist required. No dizziness.  Ambulation/Gait Ambulation/Gait assistance: Min assist Ambulation Distance (Feet): 200 Feet Assistive device: None Gait Pattern/deviations: Step-through pattern;Decreased stride length;Trunk flexed;Drifts right/left;Staggering left;Staggering right;Narrow base of support Gait velocity: decreased Gait velocity interpretation: Below normal speed for age/gender General Gait Details: Slow, unsteady gait with staggering to right/left esp with higher level balance challenges with 3-4 LOB requiring assist to stabilize. Low BP.   Stairs Stairs: Yes Stairs assistance: Min guard Stair Management: Alternating pattern;Two rails Number of Stairs: 2 (+ 3 steps x2 bouts) General stair comments: Cues for technique/safety.   Wheelchair Mobility    Modified Rankin (Stroke Patients Only)       Balance Overall balance assessment: Needs assistance Sitting-balance support: Feet supported;No upper extremity supported Sitting balance-Leahy Scale: Good Sitting balance - Comments: Able to perform pericare in sitting with some assist getting toilet paper due to LUE in sling   Standing balance support: During functional activity Standing balance-Leahy Scale: Fair Standing balance comment: Able to stand statically without assist but needs UE support for ambulation.                    Cognition Arousal/Alertness: Awake/alert Behavior During Therapy: WFL for tasks assessed/performed Overall Cognitive Status: History of cognitive impairments - at baseline                      Exercises      General Comments General comments (skin integrity, edema, etc.):  Pt with difficulty problem solving how to find room despite being given max cues and signs. Poor short term memory.       Pertinent Vitals/Pain Pain Assessment:  Faces Faces Pain Scale: No hurt    Home Living                      Prior Function            PT Goals (current goals can now be found in the care plan section) Progress towards PT goals: Progressing toward goals    Frequency  Min 3X/week    PT Plan Current plan remains appropriate    Co-evaluation             End of Session Equipment Utilized During Treatment: Gait belt Activity Tolerance: Patient tolerated treatment well Patient left: in bed;with call bell/phone within reach;with bed alarm set     Time: 0902-0940 PT Time Calculation (min) (ACUTE ONLY): 38 min  Charges:  $Gait Training: 23-37 mins $Therapeutic Activity: 8-22 mins                    G Codes:      Pamela Kidd A Pamela Kidd 08/01/2015, 10:12 AM Pamela Kidd, PT, DPT [phone]

## 2015-08-01 NOTE — Care Management Note (Signed)
Case Management Note  Patient Details  Name: Pamela Kidd MRN: [id number] Date of Birth: 27-Aug-1945  Subjective/Objective:  Pt admitted with ataxia. She is from home with her spouse. Pt with 8 sec pause and pacer placed yesterday.                   Action/Plan: PT recommends [place] services. CM following for discharge needs.   Expected Discharge Date:                  Expected Discharge Plan:  [place]  In-House Referral:     Discharge planning Services  CM Consult  Post Acute Care Choice:    Choice offered to:     DME Arranged:    DME Agency:     HH Arranged:    [place] Agency:     Status of Service:  In process, will continue to follow  If discussed at Long Length of Stay Meetings, dates discussed:    Additional Comments:  Pollie Friar, RN 08/01/2015, 12:20 PM

## 2015-08-02 ENCOUNTER — Encounter (HOSPITAL_COMMUNITY): Payer: Self-pay | Admitting: General Practice

## 2015-08-02 DIAGNOSIS — F0390 Unspecified dementia without behavioral disturbance: Secondary | ICD-10-CM

## 2015-08-02 DIAGNOSIS — J690 Pneumonitis due to inhalation of food and vomit: Secondary | ICD-10-CM

## 2015-08-02 LAB — BASIC METABOLIC PANEL
Anion gap: 7 (ref 5–15)
BUN: 9 mg/dL (ref 6–20)
CHLORIDE: 112 mmol/L — AB (ref 101–111)
CO2: 19 mmol/L — AB (ref 22–32)
CREATININE: 0.75 mg/dL (ref 0.44–1.00)
Calcium: 7.6 mg/dL — ABNORMAL LOW (ref 8.9–10.3)
GFR calc Af Amer: >60 mL/min (ref >60)
GFR calc non Af Amer: >60 mL/min (ref >60)
GLUCOSE: 96 mg/dL (ref 65–99)
POTASSIUM: 3.4 mmol/L — AB (ref 3.5–5.1)
Sodium: 138 mmol/L (ref 135–145)

## 2015-08-02 MED ORDER — CLINDAMYCIN HCL 300 MG PO CAPS
300.0000 mg | ORAL_CAPSULE | Freq: Three times a day (TID) | ORAL | Status: DC
Start: 2015-08-02 — End: 2015-08-22

## 2015-08-02 MED ORDER — POTASSIUM CHLORIDE 20 MEQ/15ML (10%) PO SOLN
40.0000 meq | Freq: Once | ORAL | Status: AC
  Administered 2015-08-02: 40 meq via ORAL
  Filled 2015-08-02: qty 30

## 2015-08-02 MED ORDER — SENNOSIDES-DOCUSATE SODIUM 8.6-50 MG PO TABS: 2.0000 | ORAL_TABLET | Freq: Every evening | ORAL | Status: DC | PRN

## 2015-08-02 MED ORDER — POLYETHYLENE GLYCOL 3350 17 G PO PACK: 17.0000 g | PACK | Freq: Every day | ORAL | Status: DC

## 2015-08-02 MED ORDER — LEVOTHYROXINE SODIUM 25 MCG PO TABS: 12.5000 ug | ORAL_TABLET | Freq: Every day | ORAL | Status: DC

## 2015-08-02 MED ORDER — CLINDAMYCIN HCL 300 MG PO CAPS
300.0000 mg | ORAL_CAPSULE | Freq: Three times a day (TID) | ORAL | Status: DC
  Filled 2015-08-02: qty 1

## 2015-08-02 NOTE — Care Management Note (Signed)
Case Management Note  Patient Details  Name: Pamela Kidd MRN: [id number] Date of Birth: [birth year]  Subjective/Objective:                    Action/Plan: Patient discharging home with orders for [facility] services and DME. CM met with the patient and her husband and provided them a list of [place] agencies available with [employer]. He chose [facility]. [name] with [facility] notified and accepted the referral. Pt ordered a rolling walker. Jermaine with [facility] DME notified and will deliver the equipment to the room. Bedside RN updated.   Expected Discharge Date:                  Expected Discharge Plan:  [place]  In-House Referral:     Discharge planning Services  CM Consult  Post Acute Care Choice:  Durable Medical Equipment, Home Health Choice offered to:  Patient, Spouse  DME Arranged:  Walker rolling DME Agency:  [place]:  PT [place]:  [place]  Status of Service:  Completed, signed off  If discussed at [place] of Stay Meetings, dates discussed:    Additional Comments:  Pollie Friar, RN 08/02/2015, 2:31 PM

## 2015-08-02 NOTE — Progress Notes (Signed)
Physical Therapy Treatment Patient Details Name: Pamela Kidd MRN: [id number] DOB: Aug 07, 1945 Today's Date: 08/02/2015    History of Present Illness Pamela Kidd is a 70 y.o. 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 Pt with possible PNA, mildly febrile, and left flank pain from fall that she does not remember. s/p pacemaker placement 7/14.     PT Comments    Pt with supportive family and son present.  He states she did better today than she has recently.  Lacks reciprocal arm swing and decreased balance with head turns and direction changes. Con't to recommend youth RW and HHPT.  Follow Up Recommendations  Home health PT;Supervision for mobility/OOB     Equipment Recommendations  Rolling walker with 5" wheels (youth)    Recommendations for Other Services       Precautions / Restrictions Precautions Precautions: Fall Restrictions Weight Bearing Restrictions: No    Mobility  Bed Mobility Overal bed mobility: Needs Assistance Bed Mobility: Supine to Sit     Supine to sit: Min assist     General bed mobility comments: Needed A due to plutting bed flat and not allowing pt to use rails  Transfers Overall transfer level: Needs assistance Equipment used: None Transfers: Sit to/from Stand Sit to Stand: Min guard         General transfer comment: min/guard and no c/o dizziness  Ambulation/Gait Ambulation/Gait assistance: Min guard;Min assist Ambulation Distance (Feet): 250 Feet Assistive device: None Gait Pattern/deviations: Step-through pattern;Narrow base of support Gait velocity: decreased   General Gait Details: 1 LOB with head turn to L, but no LOB when head turned to R. min/guard at times with straight path, but needs MIN A at times. MIN for  turning and direction changes.   Stairs            Wheelchair Mobility    Modified Rankin (Stroke Patients Only)       Balance     Sitting balance-Leahy Scale: Good     Standing balance support: During functional activity Standing balance-Leahy Scale: Fair               High level balance activites: Side stepping;Turns      Cognition Arousal/Alertness: Awake/alert Behavior During Therapy: WFL for tasks assessed/performed Overall Cognitive Status: History of cognitive impairments - at baseline                      Exercises      General Comments        Pertinent Vitals/Pain Pain Assessment: No/denies pain    Home Living                      Prior Function            PT Goals (current goals can now be found in the care plan section) Acute Rehab PT Goals Patient Stated Goal: return home to her 5 dogs PT Goal Formulation: With patient/family Time For Goal Achievement: 08/14/15 Potential to Achieve Goals: Good Progress towards PT goals: Progressing toward goals    Frequency  Min 3X/week    PT Plan Current plan remains appropriate    Co-evaluation             End of Session Equipment Utilized During Treatment: Gait belt Activity Tolerance: Patient  tolerated treatment well Patient left: in bed;with call bell/phone within reach;with family/visitor present (sitting EOB eating lunch)     Time: [id number] PT Time Calculation (min) (ACUTE ONLY): 28 min  Charges:  $Gait Training: 8-22 mins $Neuromuscular Re-education: 8-22 mins                    G Codes:      Pamela Kidd 08/02/2015, 12:45 PM

## 2015-08-02 NOTE — Discharge Summary (Addendum)
Physician Discharge Summary  Pamela Kidd [id number] DOB: 1945/05/02  PCP: [name], MD  Admit date: 07/30/2015 Discharge date: 08/02/2015  Admitted From: Home Disposition:  Home  Recommendations for Outpatient Follow-up:  1. Dr. [name], PCP in 1 week with repeat labs (CBC & BMP). Please follow final blood cultures that were sent from the hospital. 2. [place] on 08/14/15 at 4 PM for pacemaker site wound check. 3. Dr. [name], EP Cardiology in 3 months. Office will arrange follow-up. 4. Recommend repeating TSH, free T4 in 4-6 weeks. 5. Recommend repeating chest x-ray in 3-4 weeks to ensure resolution of pneumonia findings. 6. Vitamin B12 has been held in the hospital due to supratherapeutic levels. Consider repeating levels in a couple of weeks and deciding further course.  Home Health: Home health PT Equipment/Devices: Rolling walker with 5 inch wheels (youth height)   Discharge Condition: Improved and stable  CODE STATUS: Full  Diet recommendation: As per speech therapy recommendations as below,  Dysphagia 2 (Fine chop);Thin liquid   Liquid Administration via: Cup;Straw Medication Administration: Whole meds with liquid Supervision: Patient able to self feed Compensations: Slow rate;Small sips/bites Postural Changes: Seated upright at 90 degrees   Discharge Diagnoses:  Principal Problem:   Ataxia Active Problems:   Ischemic cardiomyopathy   CAD (coronary artery disease), native coronary artery   Oral cancer (HCC)   Dementia   CAP (community acquired pneumonia)   Pressure ulcer   Brief/Interim Summary: 70 year old female with history of moderate dementia, CAD status post stenting, HLD, oral cancer status post radiation and chemotherapy with residual chronic dysphagia, ischemic cardiomyopathy, HLD, anemia, GERD, married and lives with spouse, presented to ED on 07/30/15 with 2 day history of increasing sleepiness, difficulty  ambulating, unsteady gait, confusion and speech difficulties. CT head and soft tissue of neck unremarkable. Chest x-ray suggested possible pneumonia. Patient has chronic unchanged cough. As per spouse, at least 3-4 episodes of syncope and a fall 1 month ago. Telemetry showed positive >8 seconds. Cardiology consulted and pacemaker placed 7/12.  Assessment & Plan:  Principal Problem:  Ataxia Active Problems:  Ischemic cardiomyopathy  CAD (coronary artery disease), native coronary artery  Oral cancer (HCC)  Dementia  CAP (community acquired pneumonia)  Pressure ulcer   Confusion/altered mental status/ataxia - Unclear etiology. CT head without focal or acute findings. CT soft tissue neck: Negative for abscess in the neck, no mass or adenopathy. MR brain: No acute intracranial process. - TSH upper limits of normal. Has been chronically elevated in the past. Free T4 low normal. - No other metabolic abnormalities to explain confusion. Urine streptococcal antigen negative. - Mental status has returned to baseline per family  Asystole (8.07 seconds at 11:30 PM on 07/30/15) - Spouse gives history of multiple syncopal episodes which could be related to this. - Not on any rate control medications. TSH upper limits of normal and we'll treat for subclinical hypothyroidism and initiated Synthroid this admission. - Cardiology consulted, pacemaker placed on 7/12. EDP has seen today and cleared for discharge.  Chronic dysphagia - Speech therapy evaluation appreciated. Discussed at length with speech therapy. Patient declines feeding tubes (has had this in the past) and even declines modification and diet texture. Family aware of increased risk of aspiration despite all measures.  Possible aspiration pneumonia - Patient completed 2 days of inpatient IV levofloxacin and metronidazole. Transition to oral clindamycin to complete a total 7 days treatment. Patient at high risk of recurrent aspiration  pneumonia from chronic dysphagia  and unwillingness to comply with modified diet. This is been discussed at length with family multiple times and they verbalize understanding of risks involved.  CAD - Asymptomatic. Continue aspirin and statins.  Acute kidney injury - Presented with creatinine of 1.4. Improved after hydration to 1.04.  Anemia - Stable.  Moderate dementia without behavioral disturbances & depression - Mental status has returned to baseline.  Subclinical Hypothyroid - History of chemoradiation for oral cancer. TSH upper limit of normal. Free T4 low limit of normal. Started Synthroid at 12.5 g daily. Follow TSH in 4-6 weeks.  Left flank pain - resolved. CT abdomen and pelvis confirmed constipation but no other acute findings. X-ray of the pelvis without acute fracture.  B12 deficiency - B12 level >6K. Hold B12 supplements for now.   Constipation - Bowel regimen started.  Chronic hypotension, asymptomatic - As per spouse, patient has had chronic low blood pressures. He checks using home BP monitor.SBPs range between 60s-90s. As per discussion with PT, patient ambulated with them and had no orthostatics and her systolic blood pressures stayed in the 80s and she was asymptomatic of same. Random cortisol: 9.9.  A. fib with RVR - Patient had a brief period of A. fib with RVR overnight 7/12 and reverted spontaneously to sinus rhythm. Discussed at length with patient's spouse and advised him that she is not a safe candidate for long-term anticoagulation due to fall risk and bleeding complications. Discussed with Dr. Lovena Le, [facility] who agrees that she is not a candidate for anticoagulation and may continue aspirin 81 MG daily for same. If she again develops RVR, then will need cardiology to reassess regarding management-? Amiodarone.  Hypokalemia - Replaced prior to discharge. Outpatient follow-up.   Consultants:   Cardiology  Procedures:   Permanent pacemaker  placement on 07/31/15.  2-D echo 08/01/15: Study Conclusions  - Left ventricle: The cavity size was normal. Wall thickness was  normal. Systolic function was normal. The estimated ejection  fraction was in the range of 60% to 65%. Wall motion was normal;  there were no regional wall motion abnormalities. There was no  evidence of elevated ventricular filling pressure by Doppler  parameters. - Right ventricle: Pacer wire or catheter noted in right ventricle. - Tricuspid valve: There was moderate regurgitation  Discharge Instructions      Discharge Instructions    Call MD for:  difficulty breathing, headache or visual disturbances    Complete by:  As directed      Call MD for:  extreme fatigue    Complete by:  As directed      Call MD for:  persistant dizziness or light-headedness    Complete by:  As directed      Call MD for:  persistant nausea and vomiting    Complete by:  As directed      Call MD for:  redness, tenderness, or signs of infection (pain, swelling, redness, odor or green/yellow discharge around incision site)    Complete by:  As directed      Call MD for:  severe uncontrolled pain    Complete by:  As directed      Call MD for:  temperature >100.4    Complete by:  As directed      Discharge instructions    Complete by:  As directed   DIET: As per speech therapy recommendations as follows  Dysphagia 2 (Fine chop);Thin liquid   Liquid Administration via: Cup;Straw Medication Administration: Whole meds with liquid Supervision: Patient  able to self feed Compensations: Slow rate;Small sips/bites Postural Changes: Seated upright at 90 degrees      Increase activity slowly    Complete by:  As directed             Medication List    STOP taking these medications        hydrOXYzine 10 MG tablet  Commonly known as:  ATARAX/VISTARIL     vitamin B-12 1000 MCG tablet  Commonly known as:  CYANOCOBALAMIN      TAKE these medications        aspirin EC 81  MG tablet  Take 1 tablet (81 mg total) by mouth daily.     clindamycin 300 MG capsule  Commonly known as:  CLEOCIN  Take 1 capsule (300 mg total) by mouth 3 (three) times daily.     folic acid 1 MG tablet  Commonly known as:  FOLVITE  Take 1 mg by mouth daily.     levothyroxine 25 MCG tablet  Commonly known as:  SYNTHROID, LEVOTHROID  Take 0.5 tablets (12.5 mcg total) by mouth daily before breakfast.     LORazepam 0.5 MG tablet  Commonly known as:  ATIVAN  Take 1 tablet (0.5 mg total) by mouth 2 (two) times daily as needed for anxiety (panic attack).     nitroGLYCERIN 0.4 MG SL tablet  Commonly known as:  NITROSTAT  Place 1 tablet (0.4 mg total) under the tongue every 5 (five) minutes x 3 doses as needed for chest pain.     pantoprazole 40 MG tablet  Commonly known as:  PROTONIX  Take 1 tablet (40 mg total) by mouth daily before breakfast.     polyethylene glycol packet  Commonly known as:  MIRALAX / GLYCOLAX  Take 17 g by mouth daily.     rosuvastatin 10 MG tablet  Commonly known as:  CRESTOR  Take 1 tablet (10 mg total) by mouth daily.     senna-docusate 8.6-50 MG tablet  Commonly known as:  Senokot-S  Take 2 tablets by mouth at bedtime as needed for mild constipation or moderate constipation.     sertraline 100 MG tablet  Commonly known as:  ZOLOFT  Take 100 mg by mouth daily.       Follow-up Information    Follow up with [facility] On 08/14/2015.   Specialty:  Cardiology   Why:  4;00PM, wound check   Contact information:   [address] [place] [phone]      Follow up with [name], MD.   Specialty:  Cardiology   Why:  The office will be calling you to arrange a 3 month folow up visit with Dr. Lonzo Candy information:   [id number] [place] [address] [phone]       Follow up with [facility] TOM, MD. Schedule an appointment as soon as possible for  a visit in 1 week.   Specialty:  Family Medicine   Why:  To be seen with repeat labs (CBC & BMP).   Contact information:   [address] [place] [address] [phone]      Allergies  Allergen Reactions  . Nubain [Nalbuphine Hcl] Other (See Comments)    "headache & disoriented"    . Ampicillin Itching and Dermatitis  . Codeine Itching and Nausea And Vomiting    Other reaction(s): GI Upset (intolerance)  . Cisapride Other (See  Comments)    Procedures/Studies: Ct Abdomen Pelvis Wo Contrast  07/31/2015  CLINICAL DATA:  Pt c/o back and left flank pain s/p fall Unable to recall when fall occurred Hx of dementia, hiatal hernia, diverticulosis, cholecystectomy, hysterectomy EXAM: CT ABDOMEN AND PELVIS WITHOUT CONTRAST TECHNIQUE: Multidetector CT imaging of the abdomen and pelvis was performed following the standard protocol without IV contrast. COMPARISON:  None. FINDINGS: Lower chest: Small bilateral pleural effusions. Minimal consolidation noted at both lung bases, left greater than right. Coronary arterial calcification noted. Hepatobiliary: No focal abnormality identified within the liver. Status post cholecystectomy. Pancreas: Unremarkable. Spleen: Normal in appearance. Adrenals/Urinary Tract: Normal appearance of the adrenal glands. Contrast is identified within the collecting systems and ureters. Kidneys are symmetric in size without focal renal mass. Stomach/Bowel: The stomach is normal in appearance. The appendix is well seen and has a normal appearance. Small bowel loops are normal in appearance. Significant amount stool is identified throughout colonic loops. Colonic diverticula are present. No associated inflammation. Vascular/Lymphatic: There is atherosclerotic calcification of the abdominal aorta. Two saccular aneurysms are identified in the infrarenal course, measuring 2.2 and 2.1 cm. No retroperitoneal or mesenteric adenopathy. Reproductive: Status post hysterectomy. No  adnexal mass. No free pelvic fluid. Other: Subcutaneous gas identified in the right anterior abdominal wall likely at site of recent injection. Musculoskeletal: No acute fracture or subluxation. Degenerative changes are seen in lumbar spine and both hips. IMPRESSION: 1. Small bilateral pleural effusions and minimal bibasilar consolidation, left greater than right. 2. Coronary artery disease. 3. No evidence for abdominal injury. 4. Significant stool burden. 5. Colonic diverticulosis. 6. 2 saccular infrarenal abdominal aortic aneurysms measuring 2.2 and 2.1 cm. 7. Status post hysterectomy. 8. Status post cholecystectomy. 9. Probable injection site right anterior abdominal wall. 10. No acute fracture. Electronically Signed   By: Nolon Nations M.D.   On: 07/31/2015 09:20   Dg Chest 2 View  08/01/2015  CLINICAL DATA:  Pacemaker implantation EXAM: CHEST  2 VIEW COMPARISON:  07/30/2015; 04/14/2014 FINDINGS: Grossly unchanged cardiac silhouette and mediastinal contours given slightly reduced lung volumes. Atherosclerotic plaque within the thoracic aorta. Interval placement of left anterior chest wall dual lead pacemaker with lead tips overlying expected location of the right atrium and ventricle. The lungs remain hyperexpanded with thinning of the biapical pulmonary parenchyma and blunting of the bilateral costophrenic angles. Interval development of trace bilateral effusions with associated worsening bibasilar opacities, right greater than left, likely atelectasis. No definite evidence of edema. Osteopenia without acute osseous abnormalities. Post cholecystectomy. IMPRESSION: 1. Interval placement of left anterior chest wall dual lead pacemaker without evidence of complication. Specifically, no evidence of pneumothorax. 2. Development of trace bilateral effusions with associated worsening bibasilar atelectasis. No definite evidence of edema. 3.  Emphysema. (ICD10-J43.9) 4.  Aortic Atherosclerosis (ICD10-170.0)  Electronically Signed   By: Sandi Mariscal M.D.   On: 08/01/2015 07:48   Dg Chest 2 View  07/30/2015  CLINICAL DATA:  Fever today EXAM: CHEST  2 VIEW COMPARISON:  04/14/2014 FINDINGS: Cardiomediastinal silhouette is stable. Mild hyperinflation. No pleural effusion. There is left base posterior retrocardiac nodular consolidation best seen on lateral view. Pneumonia cannot be excluded. Follow-up to resolution is recommended. No pulmonary edema. Mild levoscoliosis lower thoracic spine. Thoracic spine osteopenia. IMPRESSION: Mild hyperinflation. No pleural effusion. There is left base posterior retrocardiac nodular consolidation best seen on lateral view. Pneumonia cannot be excluded. Follow-up to resolution is recommended. Electronically Signed   By: Lahoma Crocker M.D.   On: 07/30/2015 19:25  Dg Ankle Complete Left  07/24/2015  CLINICAL DATA:  [place] 2 days ago injuring LEFT ankle, pain and swelling LEFT ankle especially at lateral malleolus, initial encounter EXAM: LEFT ANKLE COMPLETE - 3+ VIEW COMPARISON:  None FINDINGS: Diffuse soft tissue swelling. Osseous demineralization. Joint spaces preserved. Plantar calcaneal spurring. No acute fracture, dislocation, or bone destruction. IMPRESSION: Soft tissue swelling without acute bony abnormalities. Osseous demineralization with calcaneal spurring. Electronically Signed   By: Lavonia Dana M.D.   On: 07/24/2015 16:32   Ct Head Wo Contrast  07/30/2015  CLINICAL DATA:  Slurred speech. Unsteady gait. Dizziness. Symptoms began yesterday. Patient has dementia. EXAM: CT HEAD WITHOUT CONTRAST TECHNIQUE: Contiguous axial images were obtained from the base of the skull through the vertex without intravenous contrast. COMPARISON:  12/23/2012 FINDINGS: The brain shows generalized atrophy. There is no evidence of old or acute small or large vessel infarction. No mass lesion, hemorrhage, hydrocephalus or extra-axial collection. Calvarium is unremarkable. Sinuses, middle ears and  mastoids are clear. IMPRESSION: Atrophy without focal or acute finding. Electronically Signed   By: Nelson Chimes M.D.   On: 07/30/2015 16:08   Ct Soft Tissue Neck W Contrast  07/30/2015  CLINICAL DATA:  Fever.  Mouth and neck pain. EXAM: CT NECK WITH CONTRAST TECHNIQUE: Multidetector CT imaging of the neck was performed using the standard protocol following the bolus administration of intravenous contrast. CONTRAST:  4mL ISOVUE-300 IOPAMIDOL (ISOVUE-300) INJECTION 61% COMPARISON:  None. FINDINGS: Pharynx and larynx: Normal pharynx. Tongue is normal. No evidence of mass or abscess in the pharynx. Atrophic tonsils. Epiglottis and larynx normal. Salivary glands: Atrophic submandibular gland bilaterally. Atrophic parotid gland bilaterally. Thyroid: Negative Lymph nodes: No pathologic adenopathy in the neck. Vascular: Mild carotid artery calcification bilaterally. Carotid artery and jugular vein patent bilaterally. Limited intracranial: Negative Visualized orbits: Negative Mastoids and visualized paranasal sinuses: Negative Skeleton: Mild to moderate cervical degenerative change with disc and facet degeneration. No fracture or bone lesion. Patient is nearly edentulous. Upper anterior teeth show caries. No periapical abscess. Upper chest: Apical scarring bilaterally. IMPRESSION: Negative for abscess in the neck. No mass or adenopathy Dental caries upper anterior teeth without periapical abscess. Electronically Signed   By: Franchot Gallo M.D.   On: 07/30/2015 20:23   Mr Brain Wo Contrast  07/31/2015  CLINICAL DATA:  Unsteady gait, speech difficulty beginning last night. EXAM: MRI HEAD WITHOUT CONTRAST TECHNIQUE: Multiplanar, multiecho pulse sequences of the brain and surrounding structures were obtained without intravenous contrast. COMPARISON:  CT HEAD July 30, 2015 FINDINGS: INTRACRANIAL CONTENTS: No reduced diffusion to suggest acute ischemia. No susceptibility artifact to suggest hemorrhage. Mild  ventriculomegaly, on the basis of global parenchymal brain volume loss. Scattered subcentimeter supratentorial white matter FLAIR T2 hyperintensities most commonly seen with chronic small vessel ischemic disease, normal for age. No suspicious parenchymal signal, masses or mass effect. No abnormal extra-axial fluid collections. No extra-axial masses though, contrast enhanced sequences would be more sensitive. Normal major intracranial vascular flow voids present at skull base. ORBITS: The included ocular globes and orbital contents are non-suspicious. SINUSES: The mastoid air-cells and included paranasal sinuses are well-aerated. SKULL/SOFT TISSUES: No abnormal sellar expansion. No suspicious calvarial bone marrow signal. Craniocervical junction maintained. Atrophic adenoidal soft tissues and fatty replaced parotid glands, query prior radiation. Patient is edentulous. IMPRESSION: No acute intracranial process. Mild global parenchymal brain volume loss and mild chronic small vessel ischemic disease. Electronically Signed   By: Elon Alas M.D.   On: 07/31/2015 06:52   Dg Pelvis  Portable  07/31/2015  CLINICAL DATA:  Recent fall, patient reports shortness in the pelvis and hips EXAM: PORTABLE PELVIS 1-2 VIEWS COMPARISON:  None in PACs FINDINGS: The bones are subjectively osteopenic. No acute or healing pelvic fracture is observed. There is mild degenerative change of both hips greater on the right. No acute hip fracture is demonstrated. The femoral necks and intertrochanteric and subtrochanteric regions appear normal. IMPRESSION: There is mild degenerative change of both hips greatest on the right. There is no acute fracture. If there are strong clinical concerns of a hip fracture, a dedicated hip x-ray series would be recommended. Electronically Signed   By: David  Martinique M.D.   On: 07/31/2015 08:00      Subjective: Denies complaints. Anxious to go home. Per family, MS at baseline. Per RN, no acute  issues.  Discharge Exam:  Filed Vitals:   08/01/15 2155 08/02/15 0233 08/02/15 0655 08/02/15 1031  BP: 78/50 87/53 89/59  80/56  Pulse: 71 73 72 71  Temp: 98.4 F (36.9 C) 98.3 F (36.8 C) 97.9 F (36.6 C) 98.7 F (37.1 C)  TempSrc: Oral Oral Oral Oral  Resp: 18 18 18 16   Height:      Weight:      SpO2: 96% 96% 99% 98%    General exam: Small built and thinly nourished pleasant elderly female sitting up comfortably in bed. Oral mucosa moist.  Respiratory system: reduced breath sounds in the bases. Rest of lung fields clear to auscultation. Respiratory effort normal. Cardiovascular system: S1 & S2 heard, RRR. No JVD, murmurs, rubs, gallops or clicks. No pedal edema. telemetry: Sinus rhythm. Bradycardia with sinus pauses of 8.07 seconds at 11:30 PM on 7/11. Overnight 07/31/15: A. fib with RVR is-140s between 9:45 PM and 11:55 PM and since then reverted to sinus rhythm. Gastrointestinal system: Abdomen is nondistended, soft and nontender. No organomegaly or masses felt. Normal bowel sounds heard. Central nervous system: Alert and oriented only to self and partly to place . No focal neurological deficits. Extremities: Symmetric 5 x 5 power. Left ankle without acute findings.  Skin: No rashes, lesions or ulcers Psychiatry: Judgement and insight appear impaired. Mood & affect appropriate.    The results of significant diagnostics from this hospitalization (including imaging, microbiology, ancillary and laboratory) are listed below for reference.     Microbiology: Recent Results (from the past 240 hour(s))  Culture, blood (Routine X 2) w Reflex to ID Panel     Status: None (Preliminary result)   Collection Time: 07/30/15  6:55 PM  Result Value Ref Range Status   Specimen Description BLOOD RIGHT FOREARM  Final   Special Requests BOTTLES DRAWN AEROBIC AND ANAEROBIC 5CC  Final   Culture NO GROWTH 2 DAYS  Final   Report Status PENDING  Incomplete  Culture, blood (Routine X 2) w Reflex  to ID Panel     Status: None (Preliminary result)   Collection Time: 07/30/15  7:57 PM  Result Value Ref Range Status   Specimen Description BLOOD LEFT HAND  Final   Special Requests IN PEDIATRIC BOTTLE 2CC  Final   Culture NO GROWTH 2 DAYS  Final   Report Status PENDING  Incomplete     Labs: BNP (last 3 results) No results for input(s): BNP in the last 8760 hours. Basic Metabolic Panel:  Recent Labs Lab 07/30/15 1435 07/30/15 1451 07/31/15 1121 08/01/15 0810 08/02/15 0547  NA 134* 134* 135 137 138  K 4.0 4.0 3.8 3.5 3.4*  CL 103 101  108 114* 112*  CO2  --  24 18* 18* 19*  GLUCOSE 108* 115* 84 78 96  BUN 24* 21* 23* 15 9  CREATININE 1.40* 1.33* 1.04* 0.86 0.75  CALCIUM  --  9.1 8.1* 7.3* 7.6*   Liver Function Tests:  Recent Labs Lab 07/30/15 1451 07/31/15 1121  AST 24 21  ALT 15 13*  ALKPHOS 72 56  BILITOT 0.6 0.7  PROT 7.2 5.4*  ALBUMIN 3.6 2.6*   No results for input(s): LIPASE, AMYLASE in the last 168 hours. No results for input(s): AMMONIA in the last 168 hours. CBC:  Recent Labs Lab 07/30/15 1435 07/30/15 1451 07/31/15 0031 07/31/15 1330 08/01/15 0810  WBC  --  9.8  --  9.2 7.8  NEUTROABS  --  8.6*  --   --   --   HGB 13.3 12.9  --  10.7* 10.1*  HCT 39.0 40.1 32.3* 33.7* 31.2*  MCV  --  90.3  --  89.2 88.6  PLT  --  231  --  191 187   Cardiac Enzymes:  Recent Labs Lab 07/31/15 1121  TROPONINI <0.03   BNP: Invalid input(s): POCBNP CBG:  Recent Labs Lab 07/31/15 2240 08/01/15 0629 08/01/15 1141 08/01/15 1640 08/01/15 2317  GLUCAP 91 97 114* 190* 146*   D-Dimer No results for input(s): DDIMER in the last 72 hours. Hgb A1c  Recent Labs  07/31/15 1121  HGBA1C 5.7*   Lipid Profile  Recent Labs  07/31/15 1121  CHOL 110  HDL 52  LDLCALC 38  TRIG 102  CHOLHDL 2.1   Thyroid function studies  Recent Labs  07/31/15 1338  TSH 4.318   Anemia work up  Recent Labs  07/31/15 0031  VITAMINB12 6903*   Urinalysis     Component Value Date/Time   COLORURINE YELLOW 07/22/2010 [place] 07/22/2010 1609   LABSPEC 1.018 07/22/2010 1609   PHURINE 6.0 07/22/2010 1609   GLUCOSEU NEGATIVE 07/22/2010 1609   HGBUR NEGATIVE 07/22/2010 1609   BILIRUBINUR NEGATIVE 07/22/2010 1609   KETONESUR 15* 07/22/2010 1609   PROTEINUR NEGATIVE 07/22/2010 1609   UROBILINOGEN 0.2 07/22/2010 1609   NITRITE NEGATIVE 07/22/2010 1609   LEUKOCYTESUR NEGATIVE 07/22/2010 1609   Sepsis Labs Invalid input(s): PROCALCITONIN,  WBC,  LACTICIDVEN    Time coordinating discharge: Over 30 minutes  SIGNED:  Vernell Leep, MD, FACP, FHM. [facility] Pager [phone]  If 7PM-7AM, please contact night-coverage [email or web address] Password [email or web address] 08/02/2015, 1:05 PM

## 2015-08-02 NOTE — Discharge Instructions (Addendum)
Supplemental Discharge Instructions for  Pacemaker/Defibrillator Patients  Activity No heavy lifting or vigorous activity with your left/right arm for 6 to 8 weeks.  Do not raise your left/right arm above your head for one week.  Gradually raise your affected arm as drawn below.             08/04/15                     08/05/15                    08/06/15                  08/07/15 __  NO DRIVING The patient does not drive  WOUND CARE - Keep the wound area clean and dry.  Do not get this area wet for one week. No showers for one week; you may shower on 08/07/15    . - The tape/steri-strips on your wound will fall off; do not pull them off.  No bandage is needed on the site.  DO  NOT apply any creams, oils, or ointments to the wound area. - If you notice any drainage or discharge from the wound, any swelling or bruising at the site, or you develop a fever > 101? F after you are discharged home, call the office at once.  Special Instructions - You are still able to use cellular telephones; use the ear opposite the side where you have your pacemaker/defibrillator.  Avoid carrying your cellular phone near your device. - When traveling through airports, show security personnel your identification card to avoid being screened in the metal detectors.  Ask the security personnel to use the hand wand. - Avoid arc welding equipment, MRI testing (magnetic resonance imaging), TENS units (transcutaneous nerve stimulators).  Call the office for questions about other devices. - Avoid electrical appliances that are in poor condition or are not properly grounded. - Microwave ovens are safe to be near or to operate.  Additional information for defibrillator patients should your device go off: - If your device goes off ONCE and you feel fine afterward, notify the device clinic nurses. - If your device goes off ONCE and you do not feel well afterward, call 911. - If your device goes off TWICE, call 911. - If  your device goes off THREE times in one day, call 911.  DO NOT DRIVE YOURSELF OR A FAMILY MEMBER WITH A DEFIBRILLATOR TO THE HOSPITAL--CALL 911.  Aspiration Pneumonia Aspiration pneumonia is an infection in your lungs. It occurs when food, liquid, or stomach contents (vomit) are inhaled (aspirated) into your lungs. When these things get into your lungs, swelling (inflammation) and infection can occur. This can make it difficult for you to breathe. Aspiration pneumonia is a serious condition and can be life threatening. RISK FACTORS Aspiration pneumonia is more likely to occur when a person's cough (gag) reflex or ability to swallow has been decreased. Some things that can do this include:   Having a brain injury or disease, such as stroke, seizures, Parkinson's disease, dementia, or amyotrophic lateral sclerosis (ALS).   Being given general anesthetic for procedures.   Being in a coma (unconscious).   Having a narrowing of the tube that carries food to the stomach (esophagus).   Drinking too much alcohol. If a person passes out and vomits, vomit can be swallowed into the lungs.   Taking certain medicines, such as tranquilizers or sedatives.  SIGNS AND  SYMPTOMS   Coughing after swallowing food or liquids.   Breathing problems, such as wheezing or shortness of breath.   Bluish skin. This can be caused by lack of oxygen.   Coughing up food or mucus. The mucus might contain blood, greenish material, or yellowish-white fluid (pus).   Fever.   Chest pain.   Being more tired than usual (fatigue).   Sweating more than usual.   Bad breath.  DIAGNOSIS  A physical exam will be done. During the exam, the health care provider will listen to your lungs with a stethoscope to check for:   Crackling sounds in the lungs.  Decreased breath sounds.  A rapid heartbeat. Various tests may be ordered. These may include:   Chest X-ray.   CT scan.   Swallowing study. This  test looks at how food is swallowed and whether it goes into your breathing tube (trachea) or food pipe (esophagus).   Sputum culture. Saliva and mucus (sputum) are collected from the lungs or the tubes that carry air to the lungs (bronchi). The sputum is then tested for bacteria.   Bronchoscopy. This test uses a flexible tube (bronchoscope) to see inside the lungs. TREATMENT  Treatment will usually include antibiotic medicines. Other medicines may also be used to reduce fever or pain. You may need to be treated in the hospital. In the hospital, your breathing will be carefully monitored. Depending on how well you are breathing, you may need to be given oxygen, or you may need breathing support from a breathing machine (ventilator). For people who fail a swallowing study, a feeding tube might be placed in the stomach, or they may be asked to avoid certain food textures or liquids when they eat. HOME CARE INSTRUCTIONS   Carefully follow any special eating instructions you were given, such as avoiding certain food textures or thickening liquids. This reduces the risk of developing aspiration pneumonia again.  Only take over-the-counter or prescription medicines as directed by your health care provider. Follow the directions carefully.   If you were prescribed antibiotics, take them as directed. Finish them even if you start to feel better.   Rest as instructed by your health care provider.   Keep all follow-up appointments with your health care provider.  SEEK MEDICAL CARE IF:   You develop worsening shortness of breath, wheezing, or difficulty breathing.   You develop a fever.   You have chest pain.  MAKE SURE YOU:   Understand these instructions.  Will watch your condition.  Will get help right away if you are not doing well or get worse.   This information is not intended to replace advice given to you by your health care provider. Make sure you discuss any questions you  have with your health care provider.   Document Released: 11/02/2008 Document Revised: 01/10/2013 Document Reviewed: 06/23/2012 Elsevier Interactive Patient Education 2016 [employer]  Dysphagia Diet Level 2, Mechanically Altered The dysphagia level 2 diet includes foods that are blended, chopped, ground, or mashed so they are easier to chew and swallow. The foods are soft, moist, and can be chopped into -inch chunks (such as pancakes, pasta, and bananas). In order to be on this diet, you must be able to chew. This diet helps you transition between the pureed textures of the dysphagia level 1 diet to more solid textures. This diet is helpful for people with mild to moderate swallowing difficulties. It reduces the risk of food getting caught in the windpipe, trachea, or  lungs.  You may need help or supervision during meals while following this diet so that you eat safely. You will be on this diet until your health care provider advances the texture of your diet.  WHAT DO I NEED TO KNOW ABOUT THIS DIET? Foods  You may eat foods that are soft and moist.  You may need to use a blender, whisk, or masher to soften some of your foods.  You can moisten foods with gravies, sauces, vegetable or fruit juice, milk, half and half, or water when blending, mashing, or grinding your foods to the right consistency.  If you were on the dysphagia level 1 diet, you may still eat any of the foods included in that diet.  Avoid foods that are dry, hard, sticky, chewy, coarse, and crunchy. Also avoid large cuts of food.  Take small bites. Each bite should contain  inch or less of food. Liquids  Avoid liquids with seeds and chunks.  Thicken liquids, if instructed by your health care provider. Your health care provider will tell you the consistency to which you should thicken your liquids for safe swallowing. To thicken a liquid, use a commercial thickener or a thickening food (such as rice cereal or potato  flakes). Ask your health care provider for specific recommendations on thickeners. See your dietitian or health care provider regularly for help with your dietary changes. WHAT FOODS CAN I EAT? Grains Store-bought soft breads that do not have nuts or seeds. Pancakes, sweet rolls, [place] pastries, and [place] toast that have been moistened with syrup or sauce to form a slurry when blended. Well-cooked pasta, noodles, and bread dressing. Well-cooked noodles and pasta in sauce. Moist macaroni and cheese. Soft dumplings or spaetzle with gravy or butter. Cooked cereals (including oatmeal). Low-texture dry cereals, such as rice puff, corn, or wheat-flake cereals, with milk (if thin liquids are not allowed, make sure all of the milk is absorbed by the cereal before eating it). Vegetables Very soft, well-cooked vegetables in pieces less than  inch in size. Cooked potatoes that are moist, not crispy, and with sauce. Fruits Canned or cooked fruits that are soft or moist and do not have skin or seeds. Fresh, soft bananas. Fruit juices with a small amount of pulp (if thin liquids are allowed). Gelatin or plain gelatin with canned fruit, except pineapple. Meat and Other Protein Sources Tender, moist meats, poultry, or fish cooked with gravy or sauce and cubed to -inch bites or smaller. Ground meat. Moist meatball or meatloaf. Fish without bones. Moist casseroles without rice. Tuna, egg, or meat salad without chunks or hard-to-chew vegetables, such as celery and onions. Smooth quiche without large chunks. Scrambled, poached, or soft-cooked eggs with butter, margarine, sauce, or gravy. Tofu. Well-cooked, moistened and mashed beans, peas, baked beans, and other legumes. Casseroles without rice (such as tuna noodle casserole or soft moist meat lasagna). Dairy Cream cheese. Yogurt. Cottage cheese. Ask your health care provider if milk is allowed. Sweets/Desserts Pudding. Custard. Soft fruit pies with crust on the  bottom only. Crisps and cobblers without seeds or nuts and with soft crusts. Soft, moist cakes. Icing. Pre-gelled cookies. Soft, moist cookies dunked in milk, coffee, or another liquid. Jelly. Soft, smooth chocolate bars that are easily chewed. Jams and preserves without seeds. Ask your health care provider whether you can have frozen desserts. Fats and Oils Butter. Margarine. Cream for cereal, depending on liquid consistency allowed. Gravy. Cream sauces. Mayonnaise. Salad dressings. Cream cheese. Cheese spreads, plain or with  soft fruits or vegetables added. Sour cream. Sour cream dips with soft fruits or vegetables added. Whipped toppings. Other Sauces and salsas that have soft chunks that are about  inch or smaller. The items listed above may not be a complete list of recommended foods or beverages. Contact your dietitian for more options. WHAT FOODS ARE NOT RECOMMENDED? Grains All breads not listed in the recommended list. Breads that are hard or have nuts or seeds. Coarse cereals. Cereals that have nuts, seeds, dried fruits, or coconut. Rice. Corn. Vegetables Whole, raw, frozen, or dried vegetables. Tough, fibrous, chewy, or stringy cooked vegetables, such as celery, peas, broccoli, cabbage, Brussels sprouts, and asparagus. Potato skins. Potato and other vegetable chips. Fried or French-fried potatoes. Cooked corn and peas. Fruits Whole raw, frozen, or dried fruits, including coconut. Pineapple. Fruits with seeds. Meat and Other Protein Sources Dry, tough meats, such as bacon, sausage, and hot dogs. Cheese slices and cubes. Peanut butter. Hard boiled or fried eggs. Nuts. Seeds. Pizza. Sandwiches. Dry casseroles or casseroles with rice or large chunks. Dairy Yogurt with nuts, seeds, or large chunks. Sweets/Desserts Coarse, hard, chewy, or sticky desserts. Any dessert with nuts, seeds, coconut, pineapple, or dried fruit. Ask your health care provider whether you can have frozen desserts. Fats  and Oils Avoid fats with chunky, large textures, such as those with nuts or fruits. Other Soups and casseroles with large chunks. The items listed above may not be a complete list of foods and beverages to avoid. Contact your dietitian for more information.   This information is not intended to replace advice given to you by your health care provider. Make sure you discuss any questions you have with your health care provider.   Document Released: 01/05/2005 Document Revised: 01/26/2014 Document Reviewed: 12/19/2012 Elsevier Interactive Patient Education [employer].

## 2015-08-02 NOTE — Progress Notes (Signed)
Patient is being dc home. Dc instructions given to patient and caregiver. They verbalized understanding. Instructions about pace maker and care given to patient and caregiver. Both verbalized understanding.

## 2015-08-02 NOTE — Progress Notes (Signed)
OT Cancellation Note  Patient Details Name: Pamela Kidd MRN: [id number] DOB: [birth year]   Cancelled Treatment:    Reason Eval/Treat Not Completed: Patient declined, no reason specified. Pt to d/c today and without further questions/ concerns. Pt without clothes present to help (A) with dressing for d/c.   Parke Poisson B 08/02/2015, 1:37 PM

## 2015-08-04 LAB — CULTURE, BLOOD (ROUTINE X 2)
CULTURE: NO GROWTH
CULTURE: NO GROWTH

## 2015-08-07 ENCOUNTER — Encounter: Payer: Self-pay | Admitting: Physician Assistant

## 2015-08-07 ENCOUNTER — Ambulatory Visit (INDEPENDENT_AMBULATORY_CARE_PROVIDER_SITE_OTHER): Payer: Commercial Managed Care - HMO | Admitting: Physician Assistant

## 2015-08-07 VITALS — BP 100/60 | Temp 98.1°F | Wt 91.0 lb

## 2015-08-07 DIAGNOSIS — Z79899 Other long term (current) drug therapy: Secondary | ICD-10-CM | POA: Diagnosis not present

## 2015-08-07 DIAGNOSIS — Z09 Encounter for follow-up examination after completed treatment for conditions other than malignant neoplasm: Secondary | ICD-10-CM

## 2015-08-07 LAB — CBC WITH DIFFERENTIAL/PLATELET
BASOS ABS: 0 {cells}/uL (ref 0–200)
Basophils Relative: 0 %
EOS ABS: 89 {cells}/uL (ref 15–500)
EOS PCT: 1 %
HEMATOCRIT: 36.3 % (ref 35.0–45.0)
HEMOGLOBIN: 11.8 g/dL — AB (ref 12.0–15.0)
LYMPHS ABS: 979 {cells}/uL (ref 850–3900)
Lymphocytes Relative: 11 %
MCH: 28.5 pg (ref 27.0–33.0)
MCHC: 32.5 g/dL (ref 32.0–36.0)
MCV: 87.7 fL (ref 80.0–100.0)
MONO ABS: 712 {cells}/uL (ref 200–950)
MPV: 8.8 fL (ref 7.5–12.5)
Monocytes Relative: 8 %
NEUTROS PCT: 80 %
Neutro Abs: 7120 cells/uL (ref 1500–7800)
Platelets: 358 10*3/uL (ref 140–400)
RBC: 4.14 MIL/uL (ref 3.80–5.10)
RDW: 14.5 % (ref 11.0–15.0)
WBC: 8.9 10*3/uL (ref 3.8–10.8)

## 2015-08-07 NOTE — Progress Notes (Signed)
Patient ID: ARMANNI HARPOLE MRN: [id number], DOB: 06-19-1945, 70 y.o. Date of Encounter: @DATE @  Chief Complaint:  Chief Complaint  Patient presents with  . Hospitalization Follow-up    HPI: 70 y.o. year old female  presents with her husband for OV today.   She has dementia so he provides any history that needs to be obtained.  As well I have reviewed her hospital discharge summary.  THE FOLLOWING IS COPIED FROM D/C SUMMARY:  "PCP: [name], MD  Admit date: 07/30/2015 Discharge date: 08/02/2015  Admitted From: Home Disposition: Home  Recommendations for Outpatient Follow-up:  1. Dr. [name], PCP in 1 week with repeat labs (CBC & BMP). Please follow final blood cultures that were sent from the hospital. 2. [place] on 08/14/15 at 4 PM for pacemaker site wound check. 3. Dr. [name], EP Cardiology in 3 months. Office will arrange follow-up. 4. Recommend repeating TSH, free T4 in 4-6 weeks. 5. Recommend repeating chest x-ray in 3-4 weeks to ensure resolution of pneumonia findings. 6. Vitamin B12 has been held in the hospital due to supratherapeutic levels. Consider repeating levels in a couple of weeks and deciding further course."  Other significant information that I found when reviewing discharge summary:  She had asystole for 8.07 seconds on telemetry. Also spouse gave history of multiple syncopal episodes. Pacemaker was placed 07/31/15.  Also she had possible aspiration pneumonia. Completed 2 days of inpatient IV Levophed levofloxacin and metronidazole. In addition to oral clindamycin to complete 7 days treatment.  She also had brief period of A. fib with RVR. Noted spontaneously to sinus rhythm. Discussed at length with patient's husband that she was not a safe candidate for long-term anticoagulation due to fall risk and bleeding complications. Discussed with EP Dr. Lovena Le who agreed that not candidate for anticoagulation. Continue aspirin  81 mg daily. If she again develops RVR then would need cardiology to reassess regarding management and possible antiarrhythmic.    Patient and husband have no on specific concerns today. Just here for the hospital follow up. No specific concerns to address otherwise.  Past Medical History  Diagnosis Date  . Ischemic cardiomyopathy     a. 08/27/2011 Echo: EF 35-40%, mid-dist anteroseptal/apical akinesis, Gr 1 DD, mild-mod TR, PASP 52mmHg  . Hypotension     a. preventing use of ACEI  . Diverticulosis   . Hyperlipidemia     takes Crestor nightly  . Asthmatic bronchitis     "as a child; haven't had any since ~ I was 63 yr old" (06/08/2012)  . Anemia     "I've always been anemic; never had a transfusion" (06/08/2012)  . H/O hiatal hernia   . Oral cancer ([place]) 2003    a. s/p radiation/chemotherapy  . Emphysema of lung ([place])   . GERD (gastroesophageal reflux disease)     takes Protonix daily  . Constipation     takes Miralax daily prn  . CAD (coronary artery disease)     a. 08/26/2011 Acute Ant STEMI/Cath/PCI: LM nl, LAD 100p -> 2.75x23 Xience Xpedition, LCX 20, minor irregs, RCA 20-11mid, EF 30-35%, anterior/apical AK;  b. 05/2012 Cath: LM nl, LAD patent LAD stent, LCX 30ost, RCA dom, nl, EF 65%.  . Complication of anesthesia     slow to wake up  . Vertigo     occasionally  . Bruises easily     d/t taking Plavix and ASA  . History of colon polyps   . Dry mouth   .  PONV (postoperative nausea and vomiting)   . STEMI (ST elevation myocardial infarction) ([place]) 08/2011    anterior/notes 09/07/2011 (06/08/2012)  . Gastritis   . Dementia   . Osteoporosis      Home Meds: Outpatient Prescriptions Prior to Visit  Medication Sig Dispense Refill  . aspirin EC 81 MG tablet Take 1 tablet (81 mg total) by mouth daily. 90 tablet 3  . clindamycin (CLEOCIN) 300 MG capsule Take 1 capsule (300 mg total) by mouth 3 (three) times daily. 15 capsule 0  . folic acid (FOLVITE) 1 MG tablet Take 1 mg by mouth  daily.    Marland Kitchen levothyroxine (SYNTHROID, LEVOTHROID) 25 MCG tablet Take 0.5 tablets (12.5 mcg total) by mouth daily before breakfast. 30 tablet 0  . LORazepam (ATIVAN) 0.5 MG tablet Take 1 tablet (0.5 mg total) by mouth 2 (two) times daily as needed for anxiety (panic attack). 10 tablet 0  . nitroGLYCERIN (NITROSTAT) 0.4 MG SL tablet Place 1 tablet (0.4 mg total) under the tongue every 5 (five) minutes x 3 doses as needed for chest pain. 30 tablet 3  . pantoprazole (PROTONIX) 40 MG tablet Take 1 tablet (40 mg total) by mouth daily before breakfast. 90 tablet 3  . polyethylene glycol (MIRALAX / GLYCOLAX) packet Take 17 g by mouth daily. 14 each 0  . rosuvastatin (CRESTOR) 10 MG tablet Take 1 tablet (10 mg total) by mouth daily. 90 tablet 3  . senna-docusate (SENOKOT-S) 8.6-50 MG tablet Take 2 tablets by mouth at bedtime as needed for mild constipation or moderate constipation. 30 tablet 0  . sertraline (ZOLOFT) 100 MG tablet Take 100 mg by mouth daily.   5   No facility-administered medications prior to visit.    Allergies:  Allergies  Allergen Reactions  . Nubain [Nalbuphine Hcl] Other (See Comments)    "headache & disoriented"    . Ampicillin Itching and Dermatitis  . Codeine Itching and Nausea And Vomiting    Other reaction(s): GI Upset (intolerance)  . Cisapride Other (See Comments)    Social History   Social History  . Marital Status: Married    Spouse Name: N/A  . Number of Children: 4  . Years of Education: N/A   Occupational History  . Retired    Social History Main Topics  . Smoking status: Never Smoker   . Smokeless tobacco: Never Used  . Alcohol Use: No  . Drug Use: No  . Sexual Activity: No   Other Topics Concern  . Not on file   Social History Narrative    Family History  Problem Relation Age of Onset  . Dementia Mother   . Heart disease Father   . Cancer Father     prostate  . Heart disease Paternal Grandfather   . Colon cancer Neg Hx      Review  of Systems:  See HPI for pertinent ROS. All other ROS negative.    Physical Exam: Blood pressure 100/60, temperature 98.1 F (36.7 C), weight 91 lb (41.277 kg)., Body mass index is 19.02 kg/(m^2). General: Petite WF. Appears in no acute distress. Neck: Supple. No thyromegaly. No lymphadenopathy. Lungs: Clear bilaterally to auscultation without wheezes, rales, or rhonchi. Breathing is unlabored. Heart: RRR with S1 S2. No murmurs, rubs, or gallops. Musculoskeletal:  Strength and tone normal for age. Extremities/Skin: Warm and dry.  Neuro: Alert and oriented X 3. Moves all extremities spontaneously. Gait is normal. CNII-XII grossly in tact. Psych:  Responds to questions appropriately with a normal  affect.     ASSESSMENT AND PLAN:  70 y.o. year old female with  1. Hospital discharge follow-up Will obtain labs.  - CBC with Differential/Platelet - BASIC METABOLIC PANEL WITH GFR  Will have them schedule f/u OV with Dr. Dennard Schaumann in 2 weeks.  At that time he can order the follow-up chest x-ray and  TSH and free T4.  Signed, [address] [employer], [place], [facility] 08/07/2015 11:15 AM

## 2015-08-08 LAB — BASIC METABOLIC PANEL WITH GFR
BUN: 8 mg/dL (ref 7–25)
CALCIUM: 8.9 mg/dL (ref 8.6–10.4)
CO2: 20 mmol/L (ref 20–31)
Chloride: 104 mmol/L (ref 98–110)
Creat: 1.05 mg/dL — ABNORMAL HIGH (ref 0.60–0.93)
GFR, EST AFRICAN AMERICAN: 62 mL/min (ref >=60)
GFR, EST NON AFRICAN AMERICAN: 54 mL/min — AB (ref >=60)
Glucose, Bld: 152 mg/dL — ABNORMAL HIGH (ref 70–99)
POTASSIUM: 4.2 mmol/L (ref 3.5–5.3)
SODIUM: 139 mmol/L (ref 135–146)

## 2015-08-14 ENCOUNTER — Encounter: Payer: Self-pay | Admitting: Internal Medicine

## 2015-08-14 ENCOUNTER — Ambulatory Visit (INDEPENDENT_AMBULATORY_CARE_PROVIDER_SITE_OTHER): Payer: PPO | Admitting: *Deleted

## 2015-08-14 DIAGNOSIS — R55 Syncope and collapse: Secondary | ICD-10-CM

## 2015-08-14 DIAGNOSIS — I495 Sick sinus syndrome: Secondary | ICD-10-CM

## 2015-08-14 LAB — CUP PACEART INCLINIC DEVICE CHECK
Battery Remaining Longevity: 142.8
Battery Voltage: 3.1 V
Brady Statistic RA Percent Paced: 2.3 %
Date Time Interrogation Session: 20170726172703
Implantable Lead Implant Date: 20170712
Implantable Lead Location: 753859
Implantable Lead Location: 753860
Lead Channel Impedance Value: 487.5 Ohm
Lead Channel Pacing Threshold Amplitude: 0.5 V
Lead Channel Pacing Threshold Amplitude: 0.5 V
Lead Channel Pacing Threshold Pulse Width: 0.5 ms
Lead Channel Pacing Threshold Pulse Width: 0.5 ms
Lead Channel Pacing Threshold Pulse Width: 0.5 ms
Lead Channel Sensing Intrinsic Amplitude: 4.8 mV
Lead Channel Setting Pacing Amplitude: 3.5 V
Lead Channel Setting Pacing Pulse Width: 0.5 ms
Lead Channel Setting Sensing Sensitivity: 2 mV
MDC IDC LEAD IMPLANT DT: 20170712
MDC IDC MSMT LEADCHNL RA IMPEDANCE VALUE: 450 Ohm
MDC IDC MSMT LEADCHNL RV PACING THRESHOLD AMPLITUDE: 0.75 V
MDC IDC MSMT LEADCHNL RV PACING THRESHOLD AMPLITUDE: 0.75 V
MDC IDC MSMT LEADCHNL RV PACING THRESHOLD PULSEWIDTH: 0.5 ms
MDC IDC MSMT LEADCHNL RV SENSING INTR AMPL: 11.8 mV
MDC IDC SET LEADCHNL RV PACING AMPLITUDE: 3.5 V
MDC IDC STAT BRADY RV PERCENT PACED: 0.01 %
Pulse Gen Model: 2272
Pulse Gen Serial Number: 7921680

## 2015-08-14 NOTE — Progress Notes (Signed)
Wound check appointment. Steri-strips removed. Wound without redness or edema. Incision edges approximated, wound well healed. Normal device function. Thresholds, sensing, and impedances consistent with implant measurements. Device programmed at 3.5V for extra safety margin until 3 month visit. Histogram distribution appropriate for patient and level of activity. 8 mode switches- longest 2 hours @ 173bpm, Atach. 1 high ventricular rate noted- conducted AT, 155bpm, 8 mins. Patient educated about wound care, arm mobility, lifting restrictions. ROV with GT 11/05/15.

## 2015-08-22 ENCOUNTER — Ambulatory Visit (INDEPENDENT_AMBULATORY_CARE_PROVIDER_SITE_OTHER): Payer: Commercial Managed Care - HMO | Admitting: Family Medicine

## 2015-08-22 VITALS — BP 110/78 | HR 80 | Temp 98.1°F | Resp 14 | Ht <= 58 in | Wt 86.0 lb

## 2015-08-22 DIAGNOSIS — R1310 Dysphagia, unspecified: Secondary | ICD-10-CM | POA: Diagnosis not present

## 2015-08-22 DIAGNOSIS — R634 Abnormal weight loss: Secondary | ICD-10-CM

## 2015-08-22 DIAGNOSIS — Z09 Encounter for follow-up examination after completed treatment for conditions other than malignant neoplasm: Secondary | ICD-10-CM

## 2015-08-22 DIAGNOSIS — F0390 Unspecified dementia without behavioral disturbance: Secondary | ICD-10-CM

## 2015-08-22 LAB — TSH: TSH: 5.63 mIU/L — ABNORMAL HIGH

## 2015-08-22 NOTE — Progress Notes (Signed)
Subjective:    Patient ID: Pamela Kidd, female    DOB: 10-19-1945, 70 y.o.   MRN: [id number]  HPI10/21/16 Patient has a history of oral cancer. Over the last several months, she has developed a burning sensation in her upper oropharynx whenever she swallows. She since his burning pain just above the level of the cricoid cartilage. She also complains of dysphagia with food sticking, pills sticking, and trouble swallowing. She has lost over 10 pounds since her last office visit due to her inability to swallow, early satiety, and pain in the throat. Wt Readings from Last 3 Encounters:  08/22/15 86 lb (39 kg)  08/07/15 91 lb (41.3 kg)  07/30/15 96 lb 12.8 oz (43.9 kg)  At that time, my plan was: Patient had an EGD performed in February of this year that was completely normal. However given the location of  Of the burning pain in her upper throat along with her history of oral cancer, I believe the patient needs direct visual laryngoscopy through ENT. I will schedule her to see her ear nose and throat physician, Dr. Owens Shark, at [facility] as soon as possible. I recommended the patient drank one can of boost or injury were 2-3 times a day to try to stem the weight loss. She recently had a fainting spell due to low blood pressure and I believe protein calorie malnutrition.  11/29/14 Reportedly, laryngoscopy was performed at [facility] and no abnormalities were seen. The patient has only gained 8 ounces since her last office visit and she is wearing a heavy sweater. Her husband states that she will sleep until 10:30 every morning. She skips breakfast. She only eats a few bites for lunch and dinner. She will chew her food for 15 minutes at a time and then will spit it out. She denies any eating disorder however some of her behavior is certainly concerning for that. She denies ongoing depression or anhedonia or suicidal ideation.  At that time, my plan was: I am seeing the patient at 3:00 in the  afternoon she has still not eaten yet today. I am concerned about possible underlying eating disorder. I'll start the patient on Remeron 30 mg by mouth daily at bedtime and recheck the patient in one month. My ultimate goal would be to have the patient weighed between 85 and 90 pounds at a minimum.  12/31/14 Wt Readings from Last 3 Encounters:  08/22/15 86 lb (39 kg)  08/07/15 91 lb (41.3 kg)  07/30/15 96 lb 12.8 oz (43.9 kg)    thankfully the patient has gained 2 pounds since her last office visit. Patient's husband states that she is eating better and that her appetite has improved. She seems to be tolerating the Remeron without difficulty. She would like to continue the medication a little bit longer prior to trying Megace.  At that time, my plan [id number] has gained 2 pounds since her last visit and 3 pounds overall. Continue Remeron. Ultimately I would like the patient to gain up to 80 pounds. Recheck her weight in one month.   If she does not experience any weight gain, I would switch Remeron to Megace.  08/22/15 Since I last saw the patient, the patient was admitted the hospital with a syncopal episode. In the hospital she was found to have episodes of asystole. A pacemaker was implanted. She was also found to have episodes of A. fib with RVR. Decision was made not to pursue anticoagulation due to her fall risk. She  was also diagnosed with left lower lobe pneumonia thought secondary to aspiration. Speech therapy was consult and may recommended a dysphagia 2 diet. However the patient and her husband are not following the diet secondary to her quality of life concerns. She is here today for follow-up. She is due to recheck a TSH she was started on Synthroid 12.5 g by mouth daily in the hospital due to subclinical hypothyroidism. She is also due to recheck a chest x-ray to ensure resolution. Her dementia is obviously getting worse. Her husband provides all the details. We had a long discussion today.  The patient is again losing weight as evidenced by her 10 pound weight loss since July 11 she is not eating. We discussed possibly starting a feeding tube to ensure adequate nutrition as she has protein calorie malnutrition. Also I believe she is at high risk for aspiration and recurrent pneumonia Past Medical History:  Diagnosis Date  . Anemia    "I've always been anemic; never had a transfusion" (06/08/2012)  . Asthmatic bronchitis    "as a child; haven't had any since ~ I was 52 yr old" (06/08/2012)  . Bruises easily    d/t taking Plavix and ASA  . CAD (coronary artery disease)    a. 08/26/2011 Acute Ant STEMI/Cath/PCI: LM nl, LAD 100p -> 2.75x23 Xience Xpedition, LCX 20, minor irregs, RCA 20-15mid, EF 30-35%, anterior/apical AK;  b. 05/2012 Cath: LM nl, LAD patent LAD stent, LCX 30ost, RCA dom, nl, EF 65%.  . Complication of anesthesia    slow to wake up  . Constipation    takes Miralax daily prn  . Dementia   . Diverticulosis   . Dry mouth   . Emphysema of lung ([place])   . Gastritis   . GERD (gastroesophageal reflux disease)    takes Protonix daily  . H/O hiatal hernia   . History of colon polyps   . Hyperlipidemia    takes Crestor nightly  . Hypotension    a. preventing use of ACEI  . Ischemic cardiomyopathy    a. 08/27/2011 Echo: EF 35-40%, mid-dist anteroseptal/apical akinesis, Gr 1 DD, mild-mod TR, PASP 39mmHg  . 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 s/p radiation/chemotherapy  . Osteoporosis   . PONV (postoperative nausea and vomiting)   . STEMI (ST elevation myocardial infarction) ([place]) 08/2011   anterior/notes 09/07/2011 (06/08/2012)  . Vertigo    occasionally   Past Surgical History:  Procedure Laterality Date  . BUNIONECTOMY Bilateral 1970's  . CARDIAC CATHETERIZATION  06/09/2012   patent prox LAD stent, minor nonobstructive LAD disease, 30% ostial LCx, and otherwise widely patent cors; LVEF 65%  . CHOLECYSTECTOMY  08/18/2012  . CHOLECYSTECTOMY N/A 08/18/2012   Procedure:  LAPAROSCOPIC CHOLECYSTECTOMY WITH INTRAOPERATIVE CHOLANGIOGRAM;  Surgeon: Imogene Burn. Georgette Dover, MD;  Location: [place];  Service: General;  Laterality: N/A;  . COLONOSCOPY    . CORONARY ANGIOPLASTY WITH STENT PLACEMENT  08/2011   "1" (06/08/2012)  . EP IMPLANTABLE DEVICE N/A 07/31/2015   Procedure: Pacemaker Implant;  Surgeon: Evans Lance, MD;  Location: [place] [facility];  Service: Cardiovascular;  Laterality: N/A;  . LEFT HEART CATHETERIZATION WITH CORONARY ANGIOGRAM N/A 08/26/2011   Procedure: LEFT HEART CATHETERIZATION WITH CORONARY ANGIOGRAM;  Surgeon: Peter M Martinique, MD;  Location: [facility] CATH LAB;  Service: Cardiovascular;  Laterality: N/A;  . LEFT HEART CATHETERIZATION WITH CORONARY ANGIOGRAM N/A 06/09/2012   Procedure: LEFT HEART CATHETERIZATION WITH CORONARY ANGIOGRAM;  Surgeon: Sherren Mocha, MD;  Location[facility] CATH  LAB;  Service: Cardiovascular;  Laterality: N/A;  . PACEMAKER INSERTION     St Jude, dual chamber serial number, [id number], inserted 7/12 by Dr Lovena Le  . PEG PLACEMENT     in 2003 and then removed  . PERCUTANEOUS CORONARY STENT INTERVENTION (PCI-S) N/A 08/26/2011   Procedure: PERCUTANEOUS CORONARY STENT INTERVENTION (PCI-S);  Surgeon: Peter M Martinique, MD;  Location: [facility] CATH LAB;  Service: Cardiovascular;  Laterality: N/A;  . SHOULDER OPEN ROTATOR CUFF REPAIR Left 1990's  . TONSILLECTOMY  ~ 1951  . TUBAL LIGATION  1975  . VAGINAL HYSTERECTOMY  ~ 1989   partial   Current Outpatient Prescriptions on File Prior to Visit  Medication Sig Dispense Refill  . aspirin EC 81 MG tablet Take 1 tablet (81 mg total) by mouth daily. 90 tablet 3  . folic acid (FOLVITE) 1 MG tablet Take 1 mg by mouth daily.    Marland Kitchen levothyroxine (SYNTHROID, LEVOTHROID) 25 MCG tablet Take 0.5 tablets (12.5 mcg total) by mouth daily before breakfast. 30 tablet 0  . LORazepam (ATIVAN) 0.5 MG tablet Take 1 tablet (0.5 mg total) by mouth 2 (two) times daily as needed for anxiety (panic attack). 10 tablet 0  . nitroGLYCERIN  (NITROSTAT) 0.4 MG SL tablet Place 1 tablet (0.4 mg total) under the tongue every 5 (five) minutes x 3 doses as needed for chest pain. 30 tablet 3  . pantoprazole (PROTONIX) 40 MG tablet Take 1 tablet (40 mg total) by mouth daily before breakfast. 90 tablet 3  . polyethylene glycol (MIRALAX / GLYCOLAX) packet Take 17 g by mouth daily. 14 each 0  . rosuvastatin (CRESTOR) 10 MG tablet Take 1 tablet (10 mg total) by mouth daily. 90 tablet 3  . senna-docusate (SENOKOT-S) 8.6-50 MG tablet Take 2 tablets by mouth at bedtime as needed for mild constipation or moderate constipation. 30 tablet 0  . sertraline (ZOLOFT) 100 MG tablet Take 100 mg by mouth daily.   5   No current facility-administered medications on file prior to visit.    Allergies  Allergen Reactions  . Nubain [Nalbuphine Hcl] Other (See Comments)    "headache & disoriented"    . Ampicillin Itching and Dermatitis  . Codeine Itching and Nausea And Vomiting    Other reaction(s): GI Upset (intolerance)  . Cisapride Other (See Comments)   Social History   Social History  . Marital status: Married    Spouse name: N/A  . Number of children: 4  . Years of education: N/A   Occupational History  . Retired    Social History Main Topics  . Smoking status: Never Smoker  . Smokeless tobacco: Never Used  . Alcohol use No  . Drug use: No  . Sexual activity: No   Other Topics Concern  . Not on file   Social History Narrative  . No narrative on file      Review of Systems  All other systems reviewed and are negative.      Objective:   Physical Exam  Constitutional: She appears well-developed and well-nourished. No distress.  HENT:  Right Ear: External ear normal.  Left Ear: External ear normal.  Nose: Nose normal.  Mouth/Throat: No oropharyngeal exudate.  Eyes: Conjunctivae are normal.  Neck: Neck supple. No thyromegaly present.  Cardiovascular: Normal rate, regular rhythm and normal heart sounds.   Pulmonary/Chest:  Effort normal and breath sounds normal. No stridor. No respiratory distress. She has no wheezes. She has no rales.  Lymphadenopathy:    She  has no cervical adenopathy.  Skin: She is not diaphoretic.  Vitals reviewed.         Assessment & Plan:  Hospital discharge follow-up - Plan: TSH, DG Chest 2 View  Dementia, without behavioral disturbance  Loss of weight  Dysphagia   Had a long discussion about the risk and benefits of a feeding tube. The feeding tube we can ensure adequate nutrition as she has protein calorie malnutrition and weight loss. However the patient is adamant that she does not want to pursue this. Her husband agrees with her. Therefore I will not pursue a feeding tube placement at this time. I will recheck a TSH to ensure the appropriate dose of Synthroid. Her dementia is worsening. I believe she is at high risk for aspiration and I believe it's a matter of time before she develops another case of pneumonia. I discussed this at length with the patient and the husband and they're willing to monitor clinically at the present time even if it means end-of-life care as this is her wishes. I will repeat a chest x-ray to ensure resolution of the original left lower lobe pneumonia

## 2015-08-23 ENCOUNTER — Other Ambulatory Visit: Payer: Self-pay | Admitting: Family Medicine

## 2015-08-23 DIAGNOSIS — E039 Hypothyroidism, unspecified: Secondary | ICD-10-CM

## 2015-08-23 MED ORDER — LEVOTHYROXINE SODIUM 25 MCG PO TABS: 25.0000 ug | ORAL_TABLET | Freq: Every day | ORAL | 2 refills | Status: DC

## 2015-08-30 DIAGNOSIS — R441 Visual hallucinations: Secondary | ICD-10-CM | POA: Diagnosis not present

## 2015-08-30 DIAGNOSIS — G3183 Dementia with Lewy bodies: Secondary | ICD-10-CM | POA: Diagnosis not present

## 2015-08-30 DIAGNOSIS — F0281 Dementia in other diseases classified elsewhere with behavioral disturbance: Secondary | ICD-10-CM | POA: Diagnosis not present

## 2015-09-27 ENCOUNTER — Other Ambulatory Visit: Payer: PPO

## 2015-09-27 DIAGNOSIS — E039 Hypothyroidism, unspecified: Secondary | ICD-10-CM

## 2015-09-27 LAB — TSH: TSH: 3.01 mIU/L

## 2015-10-04 ENCOUNTER — Ambulatory Visit (INDEPENDENT_AMBULATORY_CARE_PROVIDER_SITE_OTHER): Payer: PPO | Admitting: Family Medicine

## 2015-10-04 VITALS — BP 100/62 | HR 60 | Temp 98.3°F | Resp 14 | Ht <= 58 in | Wt 88.0 lb

## 2015-10-04 DIAGNOSIS — M8708 Idiopathic aseptic necrosis of bone, other site: Secondary | ICD-10-CM | POA: Diagnosis not present

## 2015-10-04 DIAGNOSIS — M879 Osteonecrosis, unspecified: Secondary | ICD-10-CM

## 2015-10-04 MED ORDER — NYSTATIN 100000 UNIT/ML MT SUSP: 5.0000 mL | Freq: Four times a day (QID) | OROMUCOSAL | 0 refills | Status: DC

## 2015-10-07 NOTE — Progress Notes (Signed)
Subjective:    Patient ID: Thana Farr, female    DOB: [birth year], 70 y.o.   MRN: [id number]  HPI10/21/16 Patient has a history of oral cancer. Over the last several months, she has developed a burning sensation in her upper oropharynx whenever she swallows. She since his burning pain just above the level of the cricoid cartilage. She also complains of dysphagia with food sticking, pills sticking, and trouble swallowing. She has lost over 10 pounds since her last office visit due to her inability to swallow, early satiety, and pain in the throat. Wt Readings from Last 3 Encounters:  10/04/15 88 lb (39.9 kg)  08/22/15 86 lb (39 kg)  08/07/15 91 lb (41.3 kg)  At that time, my plan was: Patient had an EGD performed in February of this year that was completely normal. However given the location of  Of the burning pain in her upper throat along with her history of oral cancer, I believe the patient needs direct visual laryngoscopy through ENT. I will schedule her to see her ear nose and throat physician, Dr. Owens Shark, at [facility] as soon as possible. I recommended the patient drank one can of boost or injury were 2-3 times a day to try to stem the weight loss. She recently had a fainting spell due to low blood pressure and I believe protein calorie malnutrition.  11/29/14 Reportedly, laryngoscopy was performed at [facility] and no abnormalities were seen. The patient has only gained 8 ounces since her last office visit and she is wearing a heavy sweater. Her husband states that she will sleep until 10:30 every morning. She skips breakfast. She only eats a few bites for lunch and dinner. She will chew her food for 15 minutes at a time and then will spit it out. She denies any eating disorder however some of her behavior is certainly concerning for that. She denies ongoing depression or anhedonia or suicidal ideation.  At that time, my plan was: I am seeing the patient at 3:00 in the  afternoon she has still not eaten yet today. I am concerned about possible underlying eating disorder. I'll start the patient on Remeron 30 mg by mouth daily at bedtime and recheck the patient in one month. My ultimate goal would be to have the patient weighed between 85 and 90 pounds at a minimum.  12/31/14 Wt Readings from Last 3 Encounters:  10/04/15 88 lb (39.9 kg)  08/22/15 86 lb (39 kg)  08/07/15 91 lb (41.3 kg)    thankfully the patient has gained 2 pounds since her last office visit. Patient's husband states that she is eating better and that her appetite has improved. She seems to be tolerating the Remeron without difficulty. She would like to continue the medication a little bit longer prior to trying Megace.  At that time, my plan [id number] has gained 2 pounds since her last visit and 3 pounds overall. Continue Remeron. Ultimately I would like the patient to gain up to 80 pounds. Recheck her weight in one month.   If she does not experience any weight gain, I would switch Remeron to Megace.  08/22/15 Since I last saw the patient, the patient was admitted the hospital with a syncopal episode. In the hospital she was found to have episodes of asystole. A pacemaker was implanted. She was also found to have episodes of A. fib with RVR. Decision was made not to pursue anticoagulation due to her fall risk. She was also diagnosed with  left lower lobe pneumonia thought secondary to aspiration. Speech therapy was consult and may recommended a dysphagia 2 diet. However the patient and her husband are not following the diet secondary to her quality of life concerns. She is here today for follow-up. She is due to recheck a TSH she was started on Synthroid 12.5 g by mouth daily in the hospital due to subclinical hypothyroidism. She is also due to recheck a chest x-ray to ensure resolution. Her dementia is obviously getting worse. Her husband provides all the details. We had a long discussion today. The  patient is again losing weight as evidenced by her 10 pound weight loss since July 11 she is not eating. We discussed possibly starting a feeding tube to ensure adequate nutrition as she has protein calorie malnutrition. Also I believe she is at high risk for aspiration and recurrent pneumonia.  At that time, my plan was:   Had a long discussion about the risk and benefits of a feeding tube. The feeding tube we can ensure adequate nutrition as she has protein calorie malnutrition and weight loss. However the patient is adamant that she does not want to pursue this. Her husband agrees with her. Therefore I will not pursue a feeding tube placement at this time. I will recheck a TSH to ensure the appropriate dose of Synthroid. Her dementia is worsening. I believe she is at high risk for aspiration and I believe it's a matter of time before she develops another case of pneumonia. I discussed this at length with the patient and the husband and they're willing to monitor clinically at the present time even if it means end-of-life care as this is her wishes. I will repeat a chest x-ray to ensure resolution of the original left lower lobe pneumonia  10/07/15 Patient's dementia seems to be worsening. She is more confused and disoriented at times. Her weight is stable but her appetite is still poor. She still exhibits signs and symptoms of protein calorie malnutrition. 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 This is 70 years old. Recently she saw a dentist interested in pulling her upper teeth so that she could have dentures as well as to prevent any future complications from fractured teeth" future infections. Apparently x-rays of the jaw revealed a "infection in the jaw. This is old and chronic. She was referred to oral maxillofacial surgeon who recommended hyperbaric oxygen to try to improve blood flow to the necrotic jaw in  hopes of allowing for healing to that he could perform future surgery on the jaw and remove the teeth. At the present time the patient has no signs or symptoms of systemic illness. She has no fevers or chills. All these findings are chronic and old. On examination of her mouth today there is a thrush-like white exudate in the recesses between her mandible in her gumline bilaterally. I do not believe that this is pus but rather Candida due to her reduced saliva, poor swallowing, and overall poor state of health.  Past Medical History:  Diagnosis Date  . Anemia    "I've always been anemic; never had a transfusion" (06/08/2012)  . Asthmatic bronchitis    "as a child; haven't had any since ~ I was 43 yr old" (06/08/2012)  . Bruises easily    d/t taking Plavix and ASA  . CAD (coronary artery disease)    a. 08/26/2011 Acute Ant STEMI/Cath/PCI: LM nl, LAD 100p -> 2.75x23  Xience Xpedition, LCX 20, minor irregs, RCA 20-30mid, EF 30-35%, anterior/apical AK;  b. 05/2012 Cath: LM nl, LAD patent LAD stent, LCX 30ost, RCA dom, nl, EF 65%.  . Complication of anesthesia    slow to wake up  . Constipation    takes Miralax daily prn  . Dementia   . Diverticulosis   . Dry mouth   . Emphysema of lung ([place])   . Gastritis   . GERD (gastroesophageal reflux disease)    takes Protonix daily  . H/O hiatal hernia   . History of colon polyps   . Hyperlipidemia    takes Crestor nightly  . Hypotension    a. preventing use of ACEI  . Ischemic cardiomyopathy    a. 08/27/2011 Echo: EF 35-40%, mid-dist anteroseptal/apical akinesis, Gr 1 DD, mild-mod TR, PASP 43mmHg  . Oral cancer ([place]) 2003   a. s/p radiation/chemotherapy  . Osteoporosis   . PONV (postoperative nausea and vomiting)   . STEMI (ST elevation myocardial infarction) ([place]) 08/2011   anterior/notes 09/07/2011 (06/08/2012)  . Vertigo    occasionally   Past Surgical History:  Procedure Laterality Date  . BUNIONECTOMY Bilateral 1970's  . CARDIAC  CATHETERIZATION  06/09/2012   patent prox LAD stent, minor nonobstructive LAD disease, 30% ostial LCx, and otherwise widely patent cors; LVEF 65%  . CHOLECYSTECTOMY  08/18/2012  . CHOLECYSTECTOMY N/A 08/18/2012   Procedure: LAPAROSCOPIC CHOLECYSTECTOMY WITH INTRAOPERATIVE CHOLANGIOGRAM;  Surgeon: Imogene Burn. Georgette Dover, MD;  Location: [place];  Service: General;  Laterality: N/A;  . COLONOSCOPY    . CORONARY ANGIOPLASTY WITH STENT PLACEMENT  08/2011   "1" (06/08/2012)  . EP IMPLANTABLE DEVICE N/A 07/31/2015   Procedure: Pacemaker Implant;  Surgeon: Evans Lance, MD;  Location: [facility];  Service: Cardiovascular;  Laterality: N/A;  . LEFT HEART CATHETERIZATION WITH CORONARY ANGIOGRAM N/A 08/26/2011   Procedure: LEFT HEART CATHETERIZATION WITH CORONARY ANGIOGRAM;  Surgeon: Peter M Martinique, MD;  Location: [facility] CATH LAB;  Service: Cardiovascular;  Laterality: N/A;  . LEFT HEART CATHETERIZATION WITH CORONARY ANGIOGRAM N/A 06/09/2012   Procedure: LEFT HEART CATHETERIZATION WITH CORONARY ANGIOGRAM;  Surgeon: Sherren Mocha, MD;  Location: [facility] CATH LAB;  Service: Cardiovascular;  Laterality: N/A;  . PACEMAKER INSERTION     St Jude, dual chamber serial number, [id number], inserted 7/12 by Dr Lovena Le  . PEG PLACEMENT     in 2003 and then removed  . PERCUTANEOUS CORONARY STENT INTERVENTION (PCI-S) N/A 08/26/2011   Procedure: PERCUTANEOUS CORONARY STENT INTERVENTION (PCI-S);  Surgeon: Peter M Martinique, MD;  Location: [facility] CATH LAB;  Service: Cardiovascular;  Laterality: N/A;  . SHOULDER OPEN ROTATOR CUFF REPAIR Left 1990's  . TONSILLECTOMY  ~ 1951  . TUBAL LIGATION  1975  . VAGINAL HYSTERECTOMY  ~ 1989   partial   Current Outpatient Prescriptions on File Prior to Visit  Medication Sig Dispense Refill  . aspirin EC 81 MG tablet Take 1 tablet (81 mg total) by mouth daily. 90 tablet 3  . folic acid (FOLVITE) 1 MG tablet Take 1 mg by mouth daily.    Marland Kitchen levothyroxine (SYNTHROID, LEVOTHROID) 25 MCG tablet Take 1 tablet  (25 mcg total) by mouth daily before breakfast. 30 tablet 2  . LORazepam (ATIVAN) 0.5 MG tablet Take 1 tablet (0.5 mg total) by mouth 2 (two) times daily as needed for anxiety (panic attack). 10 tablet 0  . nitroGLYCERIN (NITROSTAT) 0.4 MG SL tablet Place 1 tablet (0.4 mg total) under the tongue every 5 (five) minutes  x 3 doses as needed for chest pain. 30 tablet 3  . pantoprazole (PROTONIX) 40 MG tablet Take 1 tablet (40 mg total) by mouth daily before breakfast. 90 tablet 3  . polyethylene glycol (MIRALAX / GLYCOLAX) packet Take 17 g by mouth daily. 14 each 0  . rosuvastatin (CRESTOR) 10 MG tablet Take 1 tablet (10 mg total) by mouth daily. 90 tablet 3  . senna-docusate (SENOKOT-S) 8.6-50 MG tablet Take 2 tablets by mouth at bedtime as needed for mild constipation or moderate constipation. 30 tablet 0  . sertraline (ZOLOFT) 100 MG tablet Take 100 mg by mouth daily.   5   No current facility-administered medications on file prior to visit.    Allergies  Allergen Reactions  . Nubain [Nalbuphine Hcl] Other (See Comments)    "headache & disoriented"    . Ampicillin Itching and Dermatitis  . Codeine Itching and Nausea And Vomiting    Other reaction(s): GI Upset (intolerance)  . Cisapride Other (See Comments)   Social History   Social History  . Marital status: Married    Spouse name: N/A  . Number of children: 4  . Years of education: N/A   Occupational History  . Retired    Social History Main Topics  . Smoking status: Never Smoker  . Smokeless tobacco: Never Used  . Alcohol use No  . Drug use: No  . Sexual activity: No   Other Topics Concern  . Not on file   Social History Narrative  . No narrative on file      Review of Systems  All other systems reviewed and are negative.      Objective:   Physical Exam  Constitutional: She appears well-developed and well-nourished. No distress.  HENT:  Right Ear: External ear normal.  Left Ear: External ear normal.  Nose:  Nose normal.  Mouth/Throat: No oropharyngeal exudate.  Eyes: Conjunctivae are normal.  Neck: Neck supple. No thyromegaly present.  Cardiovascular: Normal rate, regular rhythm and normal heart sounds.   Pulmonary/Chest: Effort normal and breath sounds normal. No stridor. No respiratory distress. She has no wheezes. She has no rales.  Lymphadenopathy:    She has no cervical adenopathy.  Skin: She is not diaphoretic.  Vitals reviewed.         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 Life expectancy seems limited due to dementia, protein calorie malnutrition, and multiple medical comorbidities. I certainly feel that she is a poor surgical candidate. At the present time both the family and I agree that intervention should only be taken if the patient's quality of life will improve from this. At the present time, she is asymptomatic demonstrating no signs or symptoms of systemic infection and no pain. Therefore we are very hesitant to proceed with any elective surgery on the jaw that could lead to complications that can further deteriorate her quality of life. Therefore we will not proceed with hyperbaric oxygen at the present time. We will try nystatin swish and swallow 5 MLS 4 times a day for one week to see if we can clear up what appears to be thrush in her mouth. Recheck in one week or immediately should symptoms worsen

## 2015-10-29 ENCOUNTER — Encounter (HOSPITAL_BASED_OUTPATIENT_CLINIC_OR_DEPARTMENT_OTHER): Payer: PPO

## 2015-10-31 ENCOUNTER — Ambulatory Visit: Payer: PPO

## 2015-10-31 ENCOUNTER — Encounter: Payer: Self-pay | Admitting: Physician Assistant

## 2015-10-31 ENCOUNTER — Ambulatory Visit (INDEPENDENT_AMBULATORY_CARE_PROVIDER_SITE_OTHER): Payer: PPO | Admitting: Physician Assistant

## 2015-10-31 VITALS — BP 100/74 | HR 76 | Temp 98.1°F | Resp 14 | Wt 88.0 lb

## 2015-10-31 DIAGNOSIS — H6123 Impacted cerumen, bilateral: Secondary | ICD-10-CM | POA: Diagnosis not present

## 2015-10-31 DIAGNOSIS — Z23 Encounter for immunization: Secondary | ICD-10-CM | POA: Diagnosis not present

## 2015-10-31 NOTE — Progress Notes (Signed)
Patient ID: Pamela Kidd MRN: [id number], DOB: December 31, 1945, 70 y.o. Date of Encounter: 10/31/2015, 2:55 PM    Chief Complaint:  Chief Complaint  Patient presents with  . office visit    ears stopped up     HPI: 70 y.o. year old female or with her husband. He says that at recent visit they were told that her ears were called up and they had planned to get them cleared out that ended up leaving without getting it done. Therefore came back and get that cleared up. Also wants to get her flu shot. No other complaints or concerns.      Home Meds:   Outpatient Medications Prior to Visit  Medication Sig Dispense Refill  . aspirin EC 81 MG tablet Take 1 tablet (81 mg total) by mouth daily. 90 tablet 3  . folic acid (FOLVITE) 1 MG tablet Take 1 mg by mouth daily.    Marland Kitchen levothyroxine (SYNTHROID, LEVOTHROID) 25 MCG tablet Take 1 tablet (25 mcg total) by mouth daily before breakfast. 30 tablet 2  . LORazepam (ATIVAN) 0.5 MG tablet Take 1 tablet (0.5 mg total) by mouth 2 (two) times daily as needed for anxiety (panic attack). 10 tablet 0  . nitroGLYCERIN (NITROSTAT) 0.4 MG SL tablet Place 1 tablet (0.4 mg total) under the tongue every 5 (five) minutes x 3 doses as needed for chest pain. 30 tablet 3  . nystatin (MYCOSTATIN) 100000 UNIT/ML suspension Take 5 mLs (500,000 Units total) by mouth 4 (four) times daily. 60 mL 0  . pantoprazole (PROTONIX) 40 MG tablet Take 1 tablet (40 mg total) by mouth daily before breakfast. 90 tablet 3  . polyethylene glycol (MIRALAX / GLYCOLAX) packet Take 17 g by mouth daily. 14 each 0  . rosuvastatin (CRESTOR) 10 MG tablet Take 1 tablet (10 mg total) by mouth daily. 90 tablet 3  . senna-docusate (SENOKOT-S) 8.6-50 MG tablet Take 2 tablets by mouth at bedtime as needed for mild constipation or moderate constipation. 30 tablet 0  . sertraline (ZOLOFT) 100 MG tablet Take 100 mg by mouth daily.   5   No facility-administered medications prior to visit.      Allergies:  Allergies  Allergen Reactions  . Nubain [Nalbuphine Hcl] Other (See Comments)    "headache & disoriented"    . Ampicillin Itching and Dermatitis  . Codeine Itching and Nausea And Vomiting    Other reaction(s): GI Upset (intolerance)  . Cisapride Other (See Comments)      Review of Systems: See HPI for pertinent ROS. All other ROS negative.    Physical Exam: There were no vitals taken for this visit., There is no height or weight on file to calculate BMI. General:  WNWD WF. Appears in no acute distress. HEENT: Bilateral ear canals are obstructed with cerumen.  Neck: Supple. No thyromegaly. No lymphadenopathy. Lungs: Clear bilaterally to auscultation without wheezes, rales, or rhonchi. Breathing is unlabored. Heart: Regular rhythm. No murmurs, rubs, or gallops. Msk:  Strength and tone normal for age. Extremities/Skin: Warm and dry. Neuro: Alert and oriented X 3. Moves all extremities spontaneously. Gait is normal. CNII-XII grossly in tact. Psych:  Responds to questions appropriately with a normal affect.     ASSESSMENT AND PLAN:  70 y.o. year old female with  1. Bilateral impacted cerumen Irrigate ears now.   She is also being given the flu shot while she is here today.   [address] [place], [place], [facility] 10/31/2015 2:55 PM

## 2015-11-01 NOTE — Addendum Note (Signed)
Addended by: Vonna Kotyk A on: 11/01/2015 08:04 AM   Modules accepted: Orders

## 2015-11-05 ENCOUNTER — Encounter: Payer: Self-pay | Admitting: Internal Medicine

## 2015-11-05 ENCOUNTER — Ambulatory Visit (INDEPENDENT_AMBULATORY_CARE_PROVIDER_SITE_OTHER): Payer: PPO | Admitting: Internal Medicine

## 2015-11-05 VITALS — BP 90/72 | HR 77 | Ht <= 58 in | Wt 89.0 lb

## 2015-11-05 DIAGNOSIS — I495 Sick sinus syndrome: Secondary | ICD-10-CM | POA: Diagnosis not present

## 2015-11-05 DIAGNOSIS — Z950 Presence of cardiac pacemaker: Secondary | ICD-10-CM

## 2015-11-05 LAB — CUP PACEART INCLINIC DEVICE CHECK
Battery Remaining Longevity: 144 mo
Battery Voltage: 3.04 V
Brady Statistic RA Percent Paced: 1.7 %
Brady Statistic RV Percent Paced: 0 %
Implantable Lead Implant Date: 20170712
Implantable Lead Implant Date: 20170712
Implantable Lead Location: 753860
Lead Channel Impedance Value: 487.5 Ohm
Lead Channel Impedance Value: 525 Ohm
Lead Channel Pacing Threshold Amplitude: 0.75 V
Lead Channel Pacing Threshold Amplitude: 0.75 V
Lead Channel Pacing Threshold Amplitude: 0.75 V
Lead Channel Pacing Threshold Pulse Width: 0.5 ms
Lead Channel Sensing Intrinsic Amplitude: 12 mV
Lead Channel Setting Pacing Amplitude: 2 V
Lead Channel Setting Pacing Pulse Width: 0.5 ms
Lead Channel Setting Sensing Sensitivity: 2 mV
MDC IDC LEAD LOCATION: 753859
MDC IDC MSMT LEADCHNL RA PACING THRESHOLD AMPLITUDE: 0.75 V
MDC IDC MSMT LEADCHNL RA PACING THRESHOLD PULSEWIDTH: 0.5 ms
MDC IDC MSMT LEADCHNL RA SENSING INTR AMPL: 5 mV
MDC IDC MSMT LEADCHNL RV PACING THRESHOLD PULSEWIDTH: 0.5 ms
MDC IDC MSMT LEADCHNL RV PACING THRESHOLD PULSEWIDTH: 0.5 ms
MDC IDC SESS DTM: 20171017165317
MDC IDC SET LEADCHNL RV PACING AMPLITUDE: 2.5 V
Pulse Gen Serial Number: 7921680

## 2015-11-05 NOTE — Patient Instructions (Addendum)
Medication Instructions:  Your physician recommends that you continue on your current medications as directed. Please refer to the Current Medication list given to you today.   Labwork: None Ordered   Testing/Procedures: None Ordered   Follow-Up: Your physician wants you to follow-up in: 9 months with Dr. Lovena Le.  You will receive a reminder letter in the mail two months in advance. If you don't receive a letter, please call our office to schedule the follow-up appointment.  Remote monitoring is used to monitor your Pacemaker  from home. This monitoring reduces the number of office visits required to check your device to one time per year. It allows [place] to keep an eye on the functioning of your device to ensure it is working properly. You are scheduled for a device check from home on 02/04/16. You may send your transmission at any time that day. If you have a wireless device, the transmission will be sent automatically. After your physician reviews your transmission, you will receive a postcard with your next transmission date.     Any Other Special Instructions Will Be Listed Below (If Applicable).     If you need a refill on your cardiac medications before your next appointment, please call your pharmacy.

## 2015-11-05 NOTE — Progress Notes (Signed)
HPI Pamela Kidd returns today for ongoing evaluation and management of her PPM. She is a pleasantly demented 70 yo woman with sinus node dysfunction and chronic hypotension who underwent PPM insertion several months ago. She has been bothered by orthostatic symptoms. No chest pain. She has a propensity to fall and has fallen without injuring herself on several occaisions. Allergies  Allergen Reactions  . Nubain [Nalbuphine Hcl] Other (See Comments)    "headache & disoriented"    . Ampicillin Itching and Dermatitis  . Codeine Itching and Nausea And Vomiting    Other reaction(s): GI Upset (intolerance)  . Cisapride Other (See Comments)     Current Outpatient Prescriptions  Medication Sig Dispense Refill  . aspirin EC 81 MG tablet Take 1 tablet (81 mg total) by mouth daily. 90 tablet 3  . folic acid (FOLVITE) 1 MG tablet Take 1 mg by mouth daily.    Marland Kitchen levothyroxine (SYNTHROID, LEVOTHROID) 25 MCG tablet Take 1 tablet (25 mcg total) by mouth daily before breakfast. 30 tablet 2  . LORazepam (ATIVAN) 0.5 MG tablet Take 1 tablet (0.5 mg total) by mouth 2 (two) times daily as needed for anxiety (panic attack). 10 tablet 0  . nitroGLYCERIN (NITROSTAT) 0.4 MG SL tablet Place 1 tablet (0.4 mg total) under the tongue every 5 (five) minutes x 3 doses as needed for chest pain. 30 tablet 3  . nystatin (MYCOSTATIN) 100000 UNIT/ML suspension Take 5 mLs (500,000 Units total) by mouth 4 (four) times daily. 60 mL 0  . pantoprazole (PROTONIX) 40 MG tablet Take 1 tablet (40 mg total) by mouth daily before breakfast. 90 tablet 3  . polyethylene glycol (MIRALAX / GLYCOLAX) packet Take 17 g by mouth daily. 14 each 0  . rosuvastatin (CRESTOR) 10 MG tablet Take 1 tablet (10 mg total) by mouth daily. 90 tablet 3  . senna-docusate (SENOKOT-S) 8.6-50 MG tablet Take 2 tablets by mouth at bedtime as needed for mild constipation or moderate constipation. 30 tablet 0  . sertraline (ZOLOFT) 100 MG tablet Take  100 mg by mouth daily.   5   No current facility-administered medications for this visit.      Past Medical History:  Diagnosis Date  . Anemia    "I've always been anemic; never had a transfusion" (06/08/2012)  . Asthmatic bronchitis    "as a child; haven't had any since ~ I was 29 yr old" (06/08/2012)  . Bruises easily    d/t taking Plavix and ASA  . CAD (coronary artery disease)    a. 08/26/2011 Acute Ant STEMI/Cath/PCI: LM nl, LAD 100p -> 2.75x23 Xience Xpedition, LCX 20, minor irregs, RCA 20-3mid, EF 30-35%, anterior/apical AK;  b. 05/2012 Cath: LM nl, LAD patent LAD stent, LCX 30ost, RCA dom, nl, EF 65%.  . Complication of anesthesia    slow to wake up  . Constipation    takes Miralax daily prn  . Dementia   . Diverticulosis   . Dry mouth   . Emphysema of lung ([place])   . Gastritis   . GERD (gastroesophageal reflux disease)    takes Protonix daily  . H/O hiatal hernia   . History of colon polyps   . Hyperlipidemia    takes Crestor nightly  . Hypotension    a. preventing use of ACEI  . Ischemic cardiomyopathy    a. 08/27/2011 Echo: EF 35-40%, mid-dist anteroseptal/apical akinesis, Gr 1 DD, mild-mod TR, PASP 73mmHg  . Oral cancer [facility]) 2003  a. s/p radiation/chemotherapy  . Osteoporosis   . PONV (postoperative nausea and vomiting)   . STEMI (ST elevation myocardial infarction) ([place]) 08/2011   anterior/notes 09/07/2011 (06/08/2012)  . Vertigo    occasionally    ROS:   All systems reviewed and negative except as noted in the HPI.   Past Surgical History:  Procedure Laterality Date  . BUNIONECTOMY Bilateral 1970's  . CARDIAC CATHETERIZATION  06/09/2012   patent prox LAD stent, minor nonobstructive LAD disease, 30% ostial LCx, and otherwise widely patent cors; LVEF 65%  . CHOLECYSTECTOMY  08/18/2012  . CHOLECYSTECTOMY N/A 08/18/2012   Procedure: LAPAROSCOPIC CHOLECYSTECTOMY WITH INTRAOPERATIVE CHOLANGIOGRAM;  Surgeon: Imogene Burn. Georgette Dover, MD;  Location: [place];  Service:  General;  Laterality: N/A;  . COLONOSCOPY    . CORONARY ANGIOPLASTY WITH STENT PLACEMENT  08/2011   "1" (06/08/2012)  . EP IMPLANTABLE DEVICE N/A 07/31/2015   Procedure: Pacemaker Implant;  Surgeon: Evans Lance, MD;  Location: [place] CV LAB;  Service: Cardiovascular;  Laterality: N/A;  . LEFT HEART CATHETERIZATION WITH CORONARY ANGIOGRAM N/A 08/26/2011   Procedure: LEFT HEART CATHETERIZATION WITH CORONARY ANGIOGRAM;  Surgeon: Peter M Martinique, MD;  Location: [facility] CATH LAB;  Service: Cardiovascular;  Laterality: N/A;  . LEFT HEART CATHETERIZATION WITH CORONARY ANGIOGRAM N/A 06/09/2012   Procedure: LEFT HEART CATHETERIZATION WITH CORONARY ANGIOGRAM;  Surgeon: Sherren Mocha, MD;  Location[facility] C[facility] LAB;  Service: Cardiovascular;  Laterality: N/A;  . PACEMAKER INSERTION     St Jude, dual chamber serial number, [id number], inserted 7/12 by Dr Lovena Le  . PEG PLACEMENT     in 2003 and then removed  . PERCUTANEOUS CORONARY STENT INTERVENTION (PCI-S) N/A 08/26/2011   Procedure: PERCUTANEOUS CORONARY STENT INTERVENTION (PCI-S);  Surgeon: Peter M Martinique, MD;  Location: [facility] CATH LAB;  Service: Cardiovascular;  Laterality: N/A;  . SHOULDER OPEN ROTATOR CUFF REPAIR Left 1990's  . TONSILLECTOMY  ~ 1951  . TUBAL LIGATION  1975  . VAGINAL HYSTERECTOMY  ~ 1989   partial     Family History  Problem Relation Age of Onset  . Dementia Mother   . Heart disease Father   . Cancer Father     prostate  . Heart disease Paternal Grandfather   . Colon cancer Neg Hx      Social History   Social History  . Marital status: Married    Spouse name: N/A  . Number of children: 4  . Years of education: N/A   Occupational History  . Retired    Social History Main Topics  . Smoking status: Never Smoker  . Smokeless tobacco: Never Used  . Alcohol use No  . Drug use: No  . Sexual activity: No   Other Topics Concern  . Not on file   Social History Narrative  . No narrative on file     BP 90/72   Pulse 77    Ht 4\' 10"  (1.473 m)   Wt 89 lb (40.4 kg)   SpO2 97%   BMI 18.60 kg/m   Physical Exam:  Well appearing NAD HEENT: Unremarkable Neck:  No JVD, no thyromegally Lymphatics:  No adenopathy Back:  No CVA tenderness Lungs:  Clear HEART:  Regular rate rhythm, no murmurs, no rubs, no clicks Abd:  soft, positive bowel sounds, no organomegally, no rebound, no guarding Ext:  2 plus pulses, no edema, no cyanosis, no clubbing Skin:  No rashes no nodules Neuro:  CN II through XII intact, motor grossly intact  DEVICE  Normal device function.  See PaceArt for details. Her St. Jude DDD PM is working normally.  Assess/Plan: 1. Sinus node dysfunction - her PPM is working normally. She is asymptoamtic. 2. Orthostasis - she is no meds to make her worse. I have asked her to increase her salt intake. 3. PPM - her St. Jude device is working normally. She does have some atrial tachycardia which is asymptomatic.  Mikle Bosworth.D.

## 2015-11-14 ENCOUNTER — Ambulatory Visit (INDEPENDENT_AMBULATORY_CARE_PROVIDER_SITE_OTHER): Payer: PPO | Admitting: Physician Assistant

## 2015-11-14 VITALS — BP 104/74 | HR 74 | Temp 98.3°F | Resp 16 | Wt 89.0 lb

## 2015-11-14 DIAGNOSIS — H6122 Impacted cerumen, left ear: Secondary | ICD-10-CM | POA: Diagnosis not present

## 2015-11-14 NOTE — Progress Notes (Signed)
Patient ID: TEQUITA FALKENHAGEN MRN: [id number], DOB: 08-13-1945, 70 y.o. Date of Encounter: 11/14/2015, 2:07 PM    Chief Complaint:  Chief Complaint  Patient presents with  . cerumen impaction     HPI: 70 y.o. year old female here for above.   She had recent office visit with me at which time she had bilateral cerumen impaction. Day we did irrigation to both ears. Her her left ear started to get to irritated and painful for [place] to complete the clean her left ear. Therefore we told him to go home and give this time to heal up some. All she could be applying over-the-counter drops to help soften and loosen this. She returns for follow-up. No other complaints or concerns today.     Home Meds:   Outpatient Medications Prior to Visit  Medication Sig Dispense Refill  . aspirin EC 81 MG tablet Take 1 tablet (81 mg total) by mouth daily. 90 tablet 3  . folic acid (FOLVITE) 1 MG tablet Take 1 mg by mouth daily.    Marland Kitchen levothyroxine (SYNTHROID, LEVOTHROID) 25 MCG tablet Take 1 tablet (25 mcg total) by mouth daily before breakfast. 30 tablet 2  . LORazepam (ATIVAN) 0.5 MG tablet Take 1 tablet (0.5 mg total) by mouth 2 (two) times daily as needed for anxiety (panic attack). 10 tablet 0  . nitroGLYCERIN (NITROSTAT) 0.4 MG SL tablet Place 1 tablet (0.4 mg total) under the tongue every 5 (five) minutes x 3 doses as needed for chest pain. 30 tablet 3  . nystatin (MYCOSTATIN) 100000 UNIT/ML suspension Take 5 mLs (500,000 Units total) by mouth 4 (four) times daily. 60 mL 0  . pantoprazole (PROTONIX) 40 MG tablet Take 1 tablet (40 mg total) by mouth daily before breakfast. 90 tablet 3  . polyethylene glycol (MIRALAX / GLYCOLAX) packet Take 17 g by mouth daily. 14 each 0  . rosuvastatin (CRESTOR) 10 MG tablet Take 1 tablet (10 mg total) by mouth daily. 90 tablet 3  . senna-docusate (SENOKOT-S) 8.6-50 MG tablet Take 2 tablets by mouth at bedtime as needed for mild constipation or moderate constipation.  30 tablet 0  . sertraline (ZOLOFT) 100 MG tablet Take 100 mg by mouth daily.   5   No facility-administered medications prior to visit.     Allergies:  Allergies  Allergen Reactions  . Nubain [Nalbuphine Hcl] Other (See Comments)    "headache & disoriented"    . Ampicillin Itching and Dermatitis  . Codeine Itching and Nausea And Vomiting    Other reaction(s): GI Upset (intolerance)  . Cisapride Other (See Comments)      Review of Systems: See HPI for pertinent ROS. All other ROS negative.    Physical Exam: Blood pressure 104/74, pulse 74, temperature 98.3 F (36.8 C), resp. rate 16, weight 89 lb (40.4 kg)., Body mass index is 18.6 kg/m. General:  Petite WF. Appears in no acute distress. HEENT: Right ear canal is patent. Left ear canal is completely obstructed with cerumen. Neck: Supple. No thyromegaly. No lymphadenopathy. Lungs: Clear bilaterally to auscultation without wheezes, rales, or rhonchi. Breathing is unlabored. Heart: Regular rhythm. No murmurs, rubs, or gallops. Msk:  Strength and tone normal for age. Extremities/Skin: Warm and dry.  Neuro: Alert and oriented X 3. Moves all extremities spontaneously. Gait is normal. CNII-XII grossly in tact. Psych:  Responds to questions appropriately with a normal affect.     ASSESSMENT AND PLAN:  70 y.o. year old female with  1.  Impacted cerumen of left ear Irrigate now.   Marin Olp [place], [place], [facility] 11/14/2015 2:07 PM

## 2015-11-15 DIAGNOSIS — G3183 Dementia with Lewy bodies: Secondary | ICD-10-CM | POA: Diagnosis not present

## 2015-11-15 DIAGNOSIS — F0281 Dementia in other diseases classified elsewhere with behavioral disturbance: Secondary | ICD-10-CM | POA: Diagnosis not present

## 2015-12-13 ENCOUNTER — Other Ambulatory Visit: Payer: Self-pay | Admitting: Family Medicine

## 2015-12-27 DIAGNOSIS — G3183 Dementia with Lewy bodies: Secondary | ICD-10-CM | POA: Diagnosis not present

## 2015-12-27 DIAGNOSIS — G301 Alzheimer's disease with late onset: Secondary | ICD-10-CM | POA: Diagnosis not present

## 2015-12-27 DIAGNOSIS — F0281 Dementia in other diseases classified elsewhere with behavioral disturbance: Secondary | ICD-10-CM | POA: Diagnosis not present

## 2016-02-04 ENCOUNTER — Ambulatory Visit (INDEPENDENT_AMBULATORY_CARE_PROVIDER_SITE_OTHER): Payer: PPO | Admitting: *Deleted

## 2016-02-04 DIAGNOSIS — I495 Sick sinus syndrome: Secondary | ICD-10-CM | POA: Diagnosis not present

## 2016-02-04 NOTE — Progress Notes (Signed)
Remote pacemaker transmission.   

## 2016-02-08 LAB — CUP PACEART REMOTE DEVICE CHECK
Battery Remaining Percentage: 95.5 %
Battery Voltage: 3.04 V
Brady Statistic AP VS Percent: 14 %
Brady Statistic AS VS Percent: 86 %
Brady Statistic RV Percent Paced: 1 %
Implantable Lead Implant Date: 20170712
Implantable Lead Location: 753860
Implantable Pulse Generator Implant Date: 20170712
Lead Channel Impedance Value: 480 Ohm
Lead Channel Pacing Threshold Amplitude: 0.75 V
Lead Channel Pacing Threshold Amplitude: 0.75 V
Lead Channel Pacing Threshold Pulse Width: 0.5 ms
Lead Channel Sensing Intrinsic Amplitude: 3.2 mV
Lead Channel Setting Pacing Amplitude: 2.5 V
Lead Channel Setting Pacing Pulse Width: 0.5 ms
MDC IDC LEAD IMPLANT DT: 20170712
MDC IDC LEAD LOCATION: 753859
MDC IDC MSMT BATTERY REMAINING LONGEVITY: 120 mo
MDC IDC MSMT LEADCHNL RA PACING THRESHOLD PULSEWIDTH: 0.5 ms
MDC IDC MSMT LEADCHNL RV IMPEDANCE VALUE: 450 Ohm
MDC IDC MSMT LEADCHNL RV SENSING INTR AMPL: 12 mV
MDC IDC PG SERIAL: 7921680
MDC IDC SESS DTM: 20180116101408
MDC IDC SET LEADCHNL RA PACING AMPLITUDE: 2 V
MDC IDC SET LEADCHNL RV SENSING SENSITIVITY: 2 mV
MDC IDC STAT BRADY AP VP PERCENT: 1 %
MDC IDC STAT BRADY AS VP PERCENT: 1 %
MDC IDC STAT BRADY RA PERCENT PACED: 14 %

## 2016-02-12 ENCOUNTER — Encounter: Payer: Self-pay | Admitting: Cardiology

## 2016-02-16 DIAGNOSIS — M4854XA Collapsed vertebra, not elsewhere classified, thoracic region, initial encounter for fracture: Secondary | ICD-10-CM | POA: Diagnosis not present

## 2016-02-16 DIAGNOSIS — I609 Nontraumatic subarachnoid hemorrhage, unspecified: Secondary | ICD-10-CM | POA: Diagnosis not present

## 2016-02-16 DIAGNOSIS — G44309 Post-traumatic headache, unspecified, not intractable: Secondary | ICD-10-CM | POA: Diagnosis not present

## 2016-02-16 DIAGNOSIS — Z950 Presence of cardiac pacemaker: Secondary | ICD-10-CM | POA: Diagnosis not present

## 2016-02-16 DIAGNOSIS — S299XXA Unspecified injury of thorax, initial encounter: Secondary | ICD-10-CM | POA: Diagnosis not present

## 2016-02-16 DIAGNOSIS — S066X9A Traumatic subarachnoid hemorrhage with loss of consciousness of unspecified duration, initial encounter: Secondary | ICD-10-CM | POA: Diagnosis not present

## 2016-02-16 DIAGNOSIS — F0390 Unspecified dementia without behavioral disturbance: Secondary | ICD-10-CM | POA: Diagnosis not present

## 2016-02-16 DIAGNOSIS — W1830XA Fall on same level, unspecified, initial encounter: Secondary | ICD-10-CM | POA: Diagnosis not present

## 2016-02-16 DIAGNOSIS — M47812 Spondylosis without myelopathy or radiculopathy, cervical region: Secondary | ICD-10-CM | POA: Diagnosis not present

## 2016-02-16 DIAGNOSIS — M549 Dorsalgia, unspecified: Secondary | ICD-10-CM | POA: Diagnosis not present

## 2016-02-16 DIAGNOSIS — M545 Low back pain: Secondary | ICD-10-CM | POA: Diagnosis not present

## 2016-02-16 DIAGNOSIS — S3993XA Unspecified injury of pelvis, initial encounter: Secondary | ICD-10-CM | POA: Diagnosis not present

## 2016-02-16 DIAGNOSIS — W19XXXA Unspecified fall, initial encounter: Secondary | ICD-10-CM | POA: Diagnosis not present

## 2016-02-16 DIAGNOSIS — M47816 Spondylosis without myelopathy or radiculopathy, lumbar region: Secondary | ICD-10-CM | POA: Diagnosis not present

## 2016-02-16 DIAGNOSIS — S066X0A Traumatic subarachnoid hemorrhage without loss of consciousness, initial encounter: Secondary | ICD-10-CM | POA: Diagnosis not present

## 2016-02-16 DIAGNOSIS — E162 Hypoglycemia, unspecified: Secondary | ICD-10-CM | POA: Diagnosis not present

## 2016-02-17 DIAGNOSIS — I609 Nontraumatic subarachnoid hemorrhage, unspecified: Secondary | ICD-10-CM | POA: Diagnosis not present

## 2016-02-17 DIAGNOSIS — S066X9A Traumatic subarachnoid hemorrhage with loss of consciousness of unspecified duration, initial encounter: Secondary | ICD-10-CM | POA: Diagnosis not present

## 2016-02-17 DIAGNOSIS — W19XXXA Unspecified fall, initial encounter: Secondary | ICD-10-CM | POA: Diagnosis not present

## 2016-02-17 DIAGNOSIS — G44309 Post-traumatic headache, unspecified, not intractable: Secondary | ICD-10-CM | POA: Diagnosis not present

## 2016-02-17 DIAGNOSIS — S22080A Wedge compression fracture of T11-T12 vertebra, initial encounter for closed fracture: Secondary | ICD-10-CM | POA: Diagnosis not present

## 2016-02-19 ENCOUNTER — Inpatient Hospital Stay: Payer: PPO | Admitting: Physician Assistant

## 2016-02-28 ENCOUNTER — Encounter: Payer: Self-pay | Admitting: Family Medicine

## 2016-02-28 ENCOUNTER — Ambulatory Visit (INDEPENDENT_AMBULATORY_CARE_PROVIDER_SITE_OTHER): Payer: PPO | Admitting: Family Medicine

## 2016-02-28 VITALS — BP 100/64 | HR 84 | Temp 97.8°F | Resp 16 | Ht <= 58 in | Wt 82.0 lb

## 2016-02-28 DIAGNOSIS — M545 Low back pain, unspecified: Secondary | ICD-10-CM

## 2016-02-28 DIAGNOSIS — Z09 Encounter for follow-up examination after completed treatment for conditions other than malignant neoplasm: Secondary | ICD-10-CM

## 2016-02-28 DIAGNOSIS — S065XAA Traumatic subdural hemorrhage with loss of consciousness status unknown, initial encounter: Secondary | ICD-10-CM

## 2016-02-28 DIAGNOSIS — I6200 Nontraumatic subdural hemorrhage, unspecified: Secondary | ICD-10-CM

## 2016-02-28 DIAGNOSIS — S065X9A Traumatic subdural hemorrhage with loss of consciousness of unspecified duration, initial encounter: Secondary | ICD-10-CM

## 2016-02-28 MED ORDER — OXYCODONE HCL 5 MG PO TABS: 5.0000 mg | ORAL_TABLET | ORAL | 0 refills | Status: DC | PRN

## 2016-02-28 NOTE — Progress Notes (Signed)
Subjective:    Patient ID: Pamela Kidd, female    DOB: Mar 17, 1945, 71 y.o.   MRN: [id number]  HPI Saturday, 6 days ago, the patient fell walking to the bathroom striking her head on the closet doors. She developed severe pain in her neck and in her lower back and was rushed to the emergency room at [facility]. I have no hospital records to review at this time. Therefore my history is based solely on the report given to me by her husband. Apparently she had a subdural hematoma. The patient was monitored in the hospital for 3 days. There was no progression of the subdural hematoma. Her headache was treated symptomatically. Her low back pain was attributed to "2 herniated disc". She was given pain medication although the husband is not sure which type she was taking. She was discharged home and instructed to follow-up here in 3 days. She is still taking her aspirin. She denies headache. She denies any blurry vision. She denies any double vision. She denies any neurologic deficit. She is confused however she has a baseline dementia but her mental status is close to her baseline. She denies any nausea or vomiting. She denies any shortness of breath or chest pain. She does complain of severe pain in her lower back around the level of L5. The pain radiates into her right hip but does not spread further than that. At the present time she is taking Tylenol only for the pain Past Medical History:  Diagnosis Date  . Anemia    "I've always been anemic; never had a transfusion" (06/08/2012)  . Asthmatic bronchitis    "as a child; haven't had any since ~ I was 32 yr old" (06/08/2012)  . Bruises easily    d/t taking Plavix and ASA  . CAD (coronary artery disease)    a. 08/26/2011 Acute Ant STEMI/Cath/PCI: LM nl, LAD 100p -> 2.75x23 Xience Xpedition, LCX 20, minor irregs, RCA 20-45mid, EF 30-35%, anterior/apical AK;  b. 05/2012 Cath: LM nl, LAD patent LAD stent, LCX 30ost, RCA dom, nl, EF 65%.  .  Complication of anesthesia    slow to wake up  . Constipation    takes Miralax daily prn  . Dementia   . Diverticulosis   . Dry mouth   . Emphysema of lung ([place])   . Gastritis   . GERD (gastroesophageal reflux disease)    takes Protonix daily  . H/O hiatal hernia   . History of colon polyps   . Hyperlipidemia    takes Crestor nightly  . Hypotension    a. preventing use of ACEI  . Ischemic cardiomyopathy    a. 08/27/2011 Echo: EF 35-40%, mid-dist anteroseptal/apical akinesis, Gr 1 DD, mild-mod TR, PASP 49mmHg  . Oral cancer ([place]) 2003   a. s/p radiation/chemotherapy  . Osteoporosis   . PONV (postoperative nausea and vomiting)   . STEMI (ST elevation myocardial infarction) ([place]) 08/2011   anterior/notes 09/07/2011 (06/08/2012)  . Vertigo    occasionally   Past Surgical History:  Procedure Laterality Date  . BUNIONECTOMY Bilateral 1970's  . CARDIAC CATHETERIZATION  06/09/2012   patent prox LAD stent, minor nonobstructive LAD disease, 30% ostial LCx, and otherwise widely patent cors; LVEF 65%  . CHOLECYSTECTOMY  08/18/2012  . CHOLECYSTECTOMY N/A 08/18/2012   Procedure: LAPAROSCOPIC CHOLECYSTECTOMY WITH INTRAOPERATIVE CHOLANGIOGRAM;  Surgeon: Imogene Burn. Georgette Dover, MD;  Location: [place];  Service: General;  Laterality: N/A;  . COLONOSCOPY    . CORONARY ANGIOPLASTY WITH STENT  PLACEMENT  08/2011   "1" (06/08/2012)  . EP IMPLANTABLE DEVICE N/A 07/31/2015   Procedure: Pacemaker Implant;  Surgeon: Evans Lance, MD;  Location: [place] CV LAB;  Service: Cardiovascular;  Laterality: N/A;  . LEFT HEART CATHETERIZATION WITH CORONARY ANGIOGRAM N/A 08/26/2011   Procedure: LEFT HEART CATHETERIZATION WITH CORONARY ANGIOGRAM;  Surgeon: Peter M Martinique, MD;  Location: [facility] CATH LAB;  Service: Cardiovascular;  Laterality: N/A;  . LEFT HEART CATHETERIZATION WITH CORONARY ANGIOGRAM N/A 06/09/2012   Procedure: LEFT HEART CATHETERIZATION WITH CORONARY ANGIOGRAM;  Surgeon: Sherren Mocha, MD;  Location: [facility] CATH LAB;   Service: Cardiovascular;  Laterality: N/A;  . PACEMAKER INSERTION     St Jude, dual chamber serial number, [id number], inserted 7/12 by Dr Lovena Le  . PEG PLACEMENT     in 2003 and then removed  . PERCUTANEOUS CORONARY STENT INTERVENTION (PCI-S) N/A 08/26/2011   Procedure: PERCUTANEOUS CORONARY STENT INTERVENTION (PCI-S);  Surgeon: Peter M Martinique, MD;  Location: [facility] CATH LAB;  Service: Cardiovascular;  Laterality: N/A;  . SHOULDER OPEN ROTATOR CUFF REPAIR Left 1990's  . TONSILLECTOMY  ~ 1951  . TUBAL LIGATION  1975  . VAGINAL HYSTERECTOMY  ~ 1989   partial   Current Outpatient Prescriptions on File Prior to Visit  Medication Sig Dispense Refill  . aspirin EC 81 MG tablet Take 1 tablet (81 mg total) by mouth daily. 90 tablet 3  . folic acid (FOLVITE) 1 MG tablet Take 1 mg by mouth daily.    Marland Kitchen levothyroxine (SYNTHROID, LEVOTHROID) 25 MCG tablet TAKE ONE TABLET BY MOUTH ONCE DAILY BEFORE  BREAKFAST 30 tablet 2  . LORazepam (ATIVAN) 0.5 MG tablet Take 1 tablet (0.5 mg total) by mouth 2 (two) times daily as needed for anxiety (panic attack). 10 tablet 0  . nitroGLYCERIN (NITROSTAT) 0.4 MG SL tablet Place 1 tablet (0.4 mg total) under the tongue every 5 (five) minutes x 3 doses as needed for chest pain. 30 tablet 3  . nystatin (MYCOSTATIN) 100000 UNIT/ML suspension Take 5 mLs (500,000 Units total) by mouth 4 (four) times daily. 60 mL 0  . pantoprazole (PROTONIX) 40 MG tablet Take 1 tablet (40 mg total) by mouth daily before breakfast. 90 tablet 3  . polyethylene glycol (MIRALAX / GLYCOLAX) packet Take 17 g by mouth daily. 14 each 0  . rosuvastatin (CRESTOR) 10 MG tablet Take 1 tablet (10 mg total) by mouth daily. 90 tablet 3  . senna-docusate (SENOKOT-S) 8.6-50 MG tablet Take 2 tablets by mouth at bedtime as needed for mild constipation or moderate constipation. 30 tablet 0  . sertraline (ZOLOFT) 100 MG tablet Take 100 mg by mouth daily.   5   No current facility-administered medications on file  prior to visit.    Allergies  Allergen Reactions  . Nubain [Nalbuphine Hcl] Other (See Comments)    "headache & disoriented"    . Ampicillin Itching and Dermatitis  . Codeine Itching and Nausea And Vomiting    Other reaction(s): GI Upset (intolerance)  . Cisapride Other (See Comments)   Social History   Social History  . Marital status: Married    Spouse name: N/A  . Number of children: 4  . Years of education: N/A   Occupational History  . Retired    Social History Main Topics  . Smoking status: Never Smoker  . Smokeless tobacco: Never Used  . Alcohol use No  . Drug use: No  . Sexual activity: No   Other Topics Concern  .  Not on file   Social History Narrative  . No narrative on file      Review of Systems  All other systems reviewed and are negative.      Objective:   Physical Exam  Constitutional: She appears well-developed. She appears cachectic. She is active and cooperative.  Non-toxic appearance. She does not have a sickly appearance. She does not appear ill. No distress.  HENT:  Right Ear: Tympanic membrane and ear canal normal.  Left Ear: Tympanic membrane and ear canal normal.  Eyes: Conjunctivae and EOM are normal. Pupils are equal, round, and reactive to light. Lids are everted and swept, no foreign bodies found.  Musculoskeletal:       Lumbar back: She exhibits decreased range of motion, tenderness and bony tenderness.  Neurological: She is alert. She has normal strength and normal reflexes. She is disoriented. No cranial nerve deficit or sensory deficit. Coordination and gait normal.  Psychiatric: Cognition and memory are impaired. She exhibits abnormal recent memory.  Vitals reviewed.         Assessment & Plan:  Hospital discharge follow-up, reported subdural hematoma, reported herniated disc 2.  Obtain records as soon as possible from [facility]. Patient signed a release of information form and we have faxed that and called  and require requested the records. I would like to review these further see if there is any other issues for follow-up. At the present time I see no clinical evidence of extension of the subdural hematoma. I did recommend that the temporarily discontinue the aspirin that the patient's taking for cardiovascular disease given the recent intracranial bleed. The patient has been taking her aspirin. I anticipate the subdural hematoma will slowly resolve on its own. I am not sure of the cause of her back pain. I would like to review the records and imaging that was performed a [facility]. For the present time, I will treat the patient symptomatically with oxycodone 5 mg every 4 hours as needed for pain. I explained to the patient's husband that this can leave her disoriented more so than her baseline and increases her risk for falls so he must be careful administering this medicine. Also recommended that she take a stool softener with it. Recheck the patient next week or immediately if worse

## 2016-03-06 ENCOUNTER — Encounter: Payer: Self-pay | Admitting: Family Medicine

## 2016-03-06 ENCOUNTER — Ambulatory Visit (INDEPENDENT_AMBULATORY_CARE_PROVIDER_SITE_OTHER): Payer: PPO | Admitting: Family Medicine

## 2016-03-06 VITALS — BP 100/62 | HR 80 | Temp 98.0°F | Resp 16 | Ht <= 58 in | Wt 81.0 lb

## 2016-03-06 DIAGNOSIS — R55 Syncope and collapse: Secondary | ICD-10-CM | POA: Diagnosis not present

## 2016-03-06 NOTE — Progress Notes (Signed)
Subjective:    Patient ID: Pamela Kidd, female    DOB: Jul 17, 1945, 71 y.o.   MRN: [id number]  HPI  02/28/16 Saturday, 6 days ago, the patient fell walking to the bathroom striking her head on the closet doors. She developed severe pain in her neck and in her lower back and was rushed to the emergency room at [facility]. I have no hospital records to review at this time. Therefore my history is based solely on the report given to me by her husband. Apparently she had a subdural hematoma. The patient was monitored in the hospital for 3 days. There was no progression of the subdural hematoma. Her headache was treated symptomatically. Her low back pain was attributed to "2 herniated disc". She was given pain medication although the husband is not sure which type she was taking. She was discharged home and instructed to follow-up here in 3 days. She is still taking her aspirin. She denies headache. She denies any blurry vision. She denies any double vision. She denies any neurologic deficit. She is confused however she has a baseline dementia but her mental status is close to her baseline. She denies any nausea or vomiting. She denies any shortness of breath or chest pain. She does complain of severe pain in her lower back around the level of L5. The pain radiates into her right hip but does not spread further than that. At the present time she is taking Tylenol only for the pain.  At that time, my plan was:  Obtain records as soon as possible from [facility]. Patient signed a release of information form and we have faxed that and called and require requested the records. I would like to review these further see if there is any other issues for follow-up. At the present time I see no clinical evidence of extension of the subdural hematoma. I did recommend that the temporarily discontinue the aspirin that the patient's taking for cardiovascular disease given the recent intracranial bleed.  The patient has been taking her aspirin. I anticipate the subdural hematoma will slowly resolve on its own. I am not sure of the cause of her back pain. I would like to review the records and imaging that was performed a [facility] For the present time, I will treat the patient symptomatically with oxycodone 5 mg every 4 hours as needed for pain. I explained to the patient's husband that this can leave her disoriented more so than her baseline and increases her risk for falls so he must be careful administering this medicine. Also recommended that she take a stool softener with it. Recheck the patient next week or immediately if worse  03/06/16 I received the records from [facility]. The patient had a T12 compression fracture seen on x-ray. There was no mention of a herniated disc. There are punctate areas of subdural hematoma but no large area of bleeding. She is here today with her husband due to concerns about passing out. He states that he found her unconscious in the floor earlier this week. She has no recollection of the event. He states that on several occasions, she is commented to him that her heart begins to race and she feels lightheaded like she is going to pass out. Of note she had a pacemaker placed in 2017 for sick sinus syndrome and bradycardia thought to be causing syncope. At that time she were having asymptomatic runs of atrial tachycardia. She denies chest pain but there have  been 2 witnessed episodes of syncope. Her husband states that recently she was at dinner when she felt lightheaded and passed out hitting her head on table.  It seems impossible that her fall that prompted the admission and [facility] was related to a syncopal episode Past Medical History:  Diagnosis Date  . Anemia    "I've always been anemic; never had a transfusion" (06/08/2012)  . Asthmatic bronchitis    "as a child; haven't had any since ~ I was 66 yr old" (06/08/2012)  . Bruises easily    d/t taking  Plavix and ASA  . CAD (coronary artery disease)    a. 08/26/2011 Acute Ant STEMI/Cath/PCI: LM nl, LAD 100p -> 2.75x23 Xience Xpedition, LCX 20, minor irregs, RCA 20-71mid, EF 30-35%, anterior/apical AK;  b. 05/2012 Cath: LM nl, LAD patent LAD stent, LCX 30ost, RCA dom, nl, EF 65%.  . Complication of anesthesia    slow to wake up  . Constipation    takes Miralax daily prn  . Dementia   . Diverticulosis   . Dry mouth   . Emphysema of lung ([place])   . Gastritis   . GERD (gastroesophageal reflux disease)    takes Protonix daily  . H/O hiatal hernia   . History of colon polyps   . Hyperlipidemia    takes Crestor nightly  . Hypotension    a. preventing use of ACEI  . Ischemic cardiomyopathy    a. 08/27/2011 Echo: EF 35-40%, mid-dist anteroseptal/apical akinesis, Gr 1 DD, mild-mod TR, PASP 19mmHg  . Oral cancer ([place]) 2003   a. s/p radiation/chemotherapy  . Osteoporosis   . PONV (postoperative nausea and vomiting)   . STEMI (ST elevation myocardial infarction) ([place]) 08/2011   anterior/notes 09/07/2011 (06/08/2012)  . Vertigo    occasionally   Past Surgical History:  Procedure Laterality Date  . BUNIONECTOMY Bilateral 1970's  . CARDIAC CATHETERIZATION  06/09/2012   patent prox LAD stent, minor nonobstructive LAD disease, 30% ostial LCx, and otherwise widely patent cors; LVEF 65%  . CHOLECYSTECTOMY  08/18/2012  . CHOLECYSTECTOMY N/A 08/18/2012   Procedure: LAPAROSCOPIC CHOLECYSTECTOMY WITH INTRAOPERATIVE CHOLANGIOGRAM;  Surgeon: Imogene Burn. Georgette Dover, MD;  Location: [place];  Service: General;  Laterality: N/A;  . COLONOSCOPY    . CORONARY ANGIOPLASTY WITH STENT PLACEMENT  08/2011   "1" (06/08/2012)  . EP IMPLANTABLE DEVICE N/A 07/31/2015   Procedure: Pacemaker Implant;  Surgeon: Evans Lance, MD;  Location: [place] [facility];  Service: Cardiovascular;  Laterality: N/A;  . LEFT HEART CATHETERIZATION WITH CORONARY ANGIOGRAM N/A 08/26/2011   Procedure: LEFT HEART CATHETERIZATION WITH CORONARY ANGIOGRAM;   Surgeon: Peter M Martinique, MD;  Location: [facility] CATH LAB;  Service: Cardiovascular;  Laterality: N/A;  . LEFT HEART CATHETERIZATION WITH CORONARY ANGIOGRAM N/A 06/09/2012   Procedure: LEFT HEART CATHETERIZATION WITH CORONARY ANGIOGRAM;  Surgeon: Sherren Mocha, MD;  Location: [facility] CATH LAB;  Service: Cardiovascular;  Laterality: N/A;  . PACEMAKER INSERTION     St Jude, dual chamber serial number, [id number], inserted 7/12 by Dr Lovena Le  . PEG PLACEMENT     in 2003 and then removed  . PERCUTANEOUS CORONARY STENT INTERVENTION (PCI-S) N/A 08/26/2011   Procedure: PERCUTANEOUS CORONARY STENT INTERVENTION (PCI-S);  Surgeon: Peter M Martinique, MD;  Location: [facility] CATH LAB;  Service: Cardiovascular;  Laterality: N/A;  . SHOULDER OPEN ROTATOR CUFF REPAIR Left 1990's  . TONSILLECTOMY  ~ 1951  . TUBAL LIGATION  1975  . VAGINAL HYSTERECTOMY  ~ 1989   partial  Current Outpatient Prescriptions on File Prior to Visit  Medication Sig Dispense Refill  . aspirin EC 81 MG tablet Take 1 tablet (81 mg total) by mouth daily. 90 tablet 3  . folic acid (FOLVITE) 1 MG tablet Take 1 mg by mouth daily.    Marland Kitchen levothyroxine (SYNTHROID, LEVOTHROID) 25 MCG tablet TAKE ONE TABLET BY MOUTH ONCE DAILY BEFORE  BREAKFAST 30 tablet 2  . LORazepam (ATIVAN) 0.5 MG tablet Take 1 tablet (0.5 mg total) by mouth 2 (two) times daily as needed for anxiety (panic attack). 10 tablet 0  . nitroGLYCERIN (NITROSTAT) 0.4 MG SL tablet Place 1 tablet (0.4 mg total) under the tongue every 5 (five) minutes x 3 doses as needed for chest pain. 30 tablet 3  . nystatin (MYCOSTATIN) 100000 UNIT/ML suspension Take 5 mLs (500,000 Units total) by mouth 4 (four) times daily. 60 mL 0  . oxyCODONE (ROXICODONE) 5 MG immediate release tablet Take 1 tablet (5 mg total) by mouth every 4 (four) hours as needed for severe pain. 30 tablet 0  . pantoprazole (PROTONIX) 40 MG tablet Take 1 tablet (40 mg total) by mouth daily before breakfast. 90 tablet 3  . polyethylene glycol  (MIRALAX / GLYCOLAX) packet Take 17 g by mouth daily. 14 each 0  . rosuvastatin (CRESTOR) 10 MG tablet Take 1 tablet (10 mg total) by mouth daily. 90 tablet 3  . senna-docusate (SENOKOT-S) 8.6-50 MG tablet Take 2 tablets by mouth at bedtime as needed for mild constipation or moderate constipation. 30 tablet 0  . sertraline (ZOLOFT) 100 MG tablet Take 100 mg by mouth daily.   5   No current facility-administered medications on file prior to visit.    Allergies  Allergen Reactions  . Nubain [Nalbuphine Hcl] Other (See Comments)    "headache & disoriented"    . Ampicillin Itching and Dermatitis  . Codeine Itching and Nausea And Vomiting    Other reaction(s): GI Upset (intolerance)  . Cisapride Other (See Comments)   Social History   Social History  . Marital status: Married    Spouse name: N/A  . Number of children: 4  . Years of education: N/A   Occupational History  . Retired    Social History Main Topics  . Smoking status: Never Smoker  . Smokeless tobacco: Never Used  . Alcohol use No  . Drug use: No  . Sexual activity: No   Other Topics Concern  . Not on file   Social History Narrative  . No narrative on file      Review of Systems  All other systems reviewed and are negative.      Objective:   Physical Exam  Constitutional: She appears well-developed. She appears cachectic. She is active and cooperative.  Non-toxic appearance. She does not have a sickly appearance. She does not appear ill. No distress.  HENT:  Right Ear: Tympanic membrane and ear canal normal.  Left Ear: Tympanic membrane and ear canal normal.  Eyes: Conjunctivae and EOM are normal. Pupils are equal, round, and reactive to light. Lids are everted and swept, no foreign bodies found.  Musculoskeletal:       Lumbar back: She exhibits decreased range of motion, tenderness and bony tenderness.  Neurological: She is alert. She has normal strength and normal reflexes. She is disoriented. No  cranial nerve deficit or sensory deficit. Coordination and gait normal.  Psychiatric: Cognition and memory are impaired. She exhibits abnormal recent memory.  Vitals reviewed.  Assessment & Plan:  Syncope and collapse - Plan: Ambulatory referral to Cardiology  I am concerned that the patient could be having some type of tachyarrhythmia causing decreased cerebral perfusion leading to syncope. Therefore I will consult her cardiologist to determine if she is having such events that would require some type of pacemaker or medication to prevent. I will schedule this as soon as possible. Her husband is concerned by her increasing abnormal behavior. She is becoming more aggressive with while mood swings and anger issues related to her dementia. She is experiencing sundowning. She is also starting to wander and recently snuck out of her home all of which is related to her dementia. She would be potentially a good candidate for medicine such as Seroquel or Risperdal however I want to avoid any such agent that can prolong the QT interval into determine what is causing her syncope. Therefore I will defer medication until evaluated by cardiology

## 2016-03-13 DIAGNOSIS — F0281 Dementia in other diseases classified elsewhere with behavioral disturbance: Secondary | ICD-10-CM | POA: Diagnosis not present

## 2016-03-13 DIAGNOSIS — G301 Alzheimer's disease with late onset: Secondary | ICD-10-CM | POA: Diagnosis not present

## 2016-03-18 ENCOUNTER — Ambulatory Visit (INDEPENDENT_AMBULATORY_CARE_PROVIDER_SITE_OTHER): Payer: PPO | Admitting: Internal Medicine

## 2016-03-18 ENCOUNTER — Encounter: Payer: Self-pay | Admitting: Internal Medicine

## 2016-03-18 VITALS — BP 110/80 | HR 90 | Ht 59.0 in | Wt 80.0 lb

## 2016-03-18 DIAGNOSIS — R55 Syncope and collapse: Secondary | ICD-10-CM | POA: Diagnosis not present

## 2016-03-18 DIAGNOSIS — I495 Sick sinus syndrome: Secondary | ICD-10-CM

## 2016-03-18 LAB — CUP PACEART INCLINIC DEVICE CHECK
Brady Statistic RV Percent Paced: 0 %
Implantable Lead Implant Date: 20170712
Implantable Lead Location: 753859
Implantable Lead Location: 753860
Implantable Pulse Generator Implant Date: 20170712
Lead Channel Pacing Threshold Amplitude: 0.5 V
Lead Channel Pacing Threshold Amplitude: 0.75 V
Lead Channel Pacing Threshold Pulse Width: 0.5 ms
Lead Channel Pacing Threshold Pulse Width: 0.5 ms
Lead Channel Pacing Threshold Pulse Width: 0.5 ms
Lead Channel Sensing Intrinsic Amplitude: 12 mV
Lead Channel Sensing Intrinsic Amplitude: 5 mV
Lead Channel Setting Pacing Amplitude: 2 V
Lead Channel Setting Pacing Amplitude: 2.5 V
Lead Channel Setting Pacing Pulse Width: 0.5 ms
MDC IDC LEAD IMPLANT DT: 20170712
MDC IDC MSMT BATTERY VOLTAGE: 3.04 V
MDC IDC MSMT LEADCHNL RA IMPEDANCE VALUE: 475 Ohm
MDC IDC MSMT LEADCHNL RA PACING THRESHOLD AMPLITUDE: 0.5 V
MDC IDC MSMT LEADCHNL RA PACING THRESHOLD PULSEWIDTH: 0.5 ms
MDC IDC MSMT LEADCHNL RV IMPEDANCE VALUE: 512.5 Ohm
MDC IDC MSMT LEADCHNL RV PACING THRESHOLD AMPLITUDE: 0.75 V
MDC IDC SESS DTM: 20180228154229
MDC IDC SET LEADCHNL RV SENSING SENSITIVITY: 2 mV
MDC IDC STAT BRADY RA PERCENT PACED: 13 %
Pulse Gen Serial Number: 7921680

## 2016-03-18 NOTE — Patient Instructions (Signed)
Medication Instructions:  Your physician recommends that you continue on your current medications as directed. Please refer to the Current Medication list given to you today.   Labwork: None Ordered   Testing/Procedures: None Ordered   Follow-Up: Your physician wants you to follow-up in: 1 year with Dr. Lovena Le. You will receive a reminder letter in the mail two months in advance. If you don't receive a letter, please call our office to schedule the follow-up appointment.  Remote monitoring is used to monitor your Pacemaker from home. This monitoring reduces the number of office visits required to check your device to one time per year. It allows [place] to keep an eye on the functioning of your device to ensure it is working properly. You are scheduled for a device check from home on 06/17/16. You may send your transmission at any time that day. If you have a wireless device, the transmission will be sent automatically. After your physician reviews your transmission, you will receive a postcard with your next transmission date.    Any Other Special Instructions Will Be Listed Below (If Applicable).     If you need a refill on your cardiac medications before your next appointment, please call your pharmacy.

## 2016-03-18 NOTE — Progress Notes (Signed)
HPI Pamela Kidd returns today for ongoing evaluation and management of her PPM. She is a pleasantly demented 71 yo woman with sinus node dysfunction and chronic hypotension who underwent PPM insertion several months ago. She has been bothered by orthostatic symptoms. No chest pain. She has a propensity to fall and has fallen without injuring herself on several occaisions. Allergies  Allergen Reactions  . Nubain [Nalbuphine Hcl] Other (See Comments)    "headache & disoriented"    . Ampicillin Itching and Dermatitis  . Codeine Itching and Nausea And Vomiting    Other reaction(s): GI Upset (intolerance)  . Cisapride Other (See Comments)     Current Outpatient Prescriptions  Medication Sig Dispense Refill  . aspirin EC 81 MG tablet Take 1 tablet (81 mg total) by mouth daily. 90 tablet 3  . folic acid (FOLVITE) 1 MG tablet Take 1 mg by mouth daily.    Marland Kitchen levothyroxine (SYNTHROID, LEVOTHROID) 25 MCG tablet TAKE ONE TABLET BY MOUTH ONCE DAILY BEFORE  BREAKFAST 30 tablet 2  . LORazepam (ATIVAN) 0.5 MG tablet Take 1 tablet (0.5 mg total) by mouth 2 (two) times daily as needed for anxiety (panic attack). 10 tablet 0  . nitroGLYCERIN (NITROSTAT) 0.4 MG SL tablet Place 1 tablet (0.4 mg total) under the tongue every 5 (five) minutes x 3 doses as needed for chest pain. 30 tablet 3  . nystatin (MYCOSTATIN) 100000 UNIT/ML suspension Take 5 mLs (500,000 Units total) by mouth 4 (four) times daily. 60 mL 0  . oxyCODONE (ROXICODONE) 5 MG immediate release tablet Take 1 tablet (5 mg total) by mouth every 4 (four) hours as needed for severe pain. 30 tablet 0  . pantoprazole (PROTONIX) 40 MG tablet Take 1 tablet (40 mg total) by mouth daily before breakfast. 90 tablet 3  . polyethylene glycol (MIRALAX / GLYCOLAX) packet Take 17 g by mouth daily. 14 each 0  . rosuvastatin (CRESTOR) 10 MG tablet Take 1 tablet (10 mg total) by mouth daily. 90 tablet 3  . senna-docusate (SENOKOT-S) 8.6-50 MG tablet  Take 2 tablets by mouth at bedtime as needed for mild constipation or moderate constipation. 30 tablet 0  . sertraline (ZOLOFT) 100 MG tablet Take 100 mg by mouth daily.   5   No current facility-administered medications for this visit.      Past Medical History:  Diagnosis Date  . Anemia    "I've always been anemic; never had a transfusion" (06/08/2012)  . Asthmatic bronchitis    "as a child; haven't had any since ~ I was 68 yr old" (06/08/2012)  . Bruises easily    d/t taking Plavix and ASA  . CAD (coronary artery disease)    a. 08/26/2011 Acute Ant STEMI/Cath/PCI: LM nl, LAD 100p -> 2.75x23 Xience Xpedition, LCX 20, minor irregs, RCA 20-34mid, EF 30-35%, anterior/apical AK;  b. 05/2012 Cath: LM nl, LAD patent LAD stent, LCX 30ost, RCA dom, nl, EF 65%.  . Complication of anesthesia    slow to wake up  . Constipation    takes Miralax daily prn  . Dementia   . Diverticulosis   . Dry mouth   . Emphysema of lung ([place])   . Gastritis   . GERD (gastroesophageal reflux disease)    takes Protonix daily  . H/O hiatal hernia   . History of colon polyps   . Hyperlipidemia    takes Crestor nightly  . Hypotension    a. preventing use of ACEI  .  Ischemic cardiomyopathy    a. 08/27/2011 Echo: EF 35-40%, mid-dist anteroseptal/apical akinesis, Gr 1 DD, mild-mod TR, PASP 51mmHg  . Oral cancer ([place]) 2003   a. s/p radiation/chemotherapy  . Osteoporosis   . PONV (postoperative nausea and vomiting)   . STEMI (ST elevation myocardial infarction) ([place]) 08/2011   anterior/notes 09/07/2011 (06/08/2012)  . Subdural hematoma ([place])    small after fall at [facility]  . T12 compression fracture ([place])    after fall at [facility]  . Vertigo    occasionally    ROS:   All systems reviewed and negative except as noted in the HPI.   Past Surgical History:  Procedure Laterality Date  . BUNIONECTOMY Bilateral 1970's  . CARDIAC CATHETERIZATION  06/09/2012   patent prox LAD stent, minor nonobstructive  LAD disease, 30% ostial LCx, and otherwise widely patent cors; LVEF 65%  . CHOLECYSTECTOMY  08/18/2012  . CHOLECYSTECTOMY N/A 08/18/2012   Procedure: LAPAROSCOPIC CHOLECYSTECTOMY WITH INTRAOPERATIVE CHOLANGIOGRAM;  Surgeon: Imogene Burn. Georgette Dover, MD;  Location: [place];  Service: General;  Laterality: N/A;  . COLONOSCOPY    . CORONARY ANGIOPLASTY WITH STENT PLACEMENT  08/2011   "1" (06/08/2012)  . EP IMPLANTABLE DEVICE N/A 07/31/2015   Procedure: Pacemaker Implant;  Surgeon: Evans Lance, MD;  Location: [place] CV LAB;  Service: Cardiovascular;  Laterality: N/A;  . LEFT HEART CATHETERIZATION WITH CORONARY ANGIOGRAM N/A 08/26/2011   Procedure: LEFT HEART CATHETERIZATION WITH CORONARY ANGIOGRAM;  Surgeon: Peter M Martinique, MD;  Location: [facility] CATH LAB;  Service: Cardiovascular;  Laterality: N/A;  . LEFT HEART CATHETERIZATION WITH CORONARY ANGIOGRAM N/A 06/09/2012   Procedure: LEFT HEART CATHETERIZATION WITH CORONARY ANGIOGRAM;  Surgeon: Sherren Mocha, MD;  Location: [facility] CATH LAB;  Service: Cardiovascular;  Laterality: N/A;  . PACEMAKER INSERTION     St Jude, dual chamber serial number, [id number], inserted 7/12 by Dr Lovena Le  . PEG PLACEMENT     in 2003 and then removed  . PERCUTANEOUS CORONARY STENT INTERVENTION (PCI-S) N/A 08/26/2011   Procedure: PERCUTANEOUS CORONARY STENT INTERVENTION (PCI-S);  Surgeon: Peter M Martinique, MD;  Location: [facility] CATH LAB;  Service: Cardiovascular;  Laterality: N/A;  . SHOULDER OPEN ROTATOR CUFF REPAIR Left 1990's  . TONSILLECTOMY  ~ 1951  . TUBAL LIGATION  1975  . VAGINAL HYSTERECTOMY  ~ 1989   partial     Family History  Problem Relation Age of Onset  . Dementia Mother   . Heart disease Father   . Cancer Father     prostate  . Heart disease Paternal Grandfather   . Colon cancer Neg Hx      Social History   Social History  . Marital status: Married    Spouse name: N/A  . Number of children: 4  . Years of education: N/A   Occupational History  . Retired     Social History Main Topics  . Smoking status: Never Smoker  . Smokeless tobacco: Never Used  . Alcohol use No  . Drug use: No  . Sexual activity: No   Other Topics Concern  . Not on file   Social History Narrative  . No narrative on file     BP 110/80   Pulse 90   Ht 4\' 11"  (1.499 m)   Wt 80 lb (36.3 kg)   SpO2 95%   BMI 16.16 kg/m   Physical Exam:  frail appearing 71 yo woman, NAD HEENT: Unremarkable Neck:  6 cm JVD, no thyromegally Lymphatics:  No adenopathy  Back:  No CVA tenderness Lungs:  Clear with no wheezes HEART:  Regular rate rhythm, no murmurs, no rubs, no clicks Abd:  soft, positive bowel sounds, no organomegally, no rebound, no guarding Ext:  2 plus pulses, no edema, no cyanosis, no clubbing Skin:  No rashes no nodules Neuro:  CN II through XII intact, motor grossly intact   DEVICE  Normal device function.  See PaceArt for details. Her St. Jude DDD PM is working normally.  Assess/Plan: 1. Sinus node dysfunction - her PPM is working normally. She is asymptoamtic. 2. Orthostasis - she is no meds to make her worse. I have asked her to increase her salt intake. 3. PPM - her St. Jude device is working normally. She does have some atrial tachycardia which is asymptomatic.  Mikle Bosworth.D.

## 2016-03-20 ENCOUNTER — Telehealth: Payer: Self-pay | Admitting: Family Medicine

## 2016-03-20 NOTE — Telephone Encounter (Signed)
ok 

## 2016-03-20 NOTE — Telephone Encounter (Signed)
Ok to refill 

## 2016-03-20 NOTE — Telephone Encounter (Signed)
Pt needs refill on oxycodone.  °

## 2016-03-23 MED ORDER — OXYCODONE HCL 5 MG PO TABS: 5.0000 mg | ORAL_TABLET | ORAL | 0 refills | Status: DC | PRN

## 2016-03-23 NOTE — Telephone Encounter (Signed)
RX printed, left up front and patient aware to pick up  

## 2016-03-25 DIAGNOSIS — G3183 Dementia with Lewy bodies: Secondary | ICD-10-CM | POA: Diagnosis not present

## 2016-03-25 DIAGNOSIS — F0281 Dementia in other diseases classified elsewhere with behavioral disturbance: Secondary | ICD-10-CM | POA: Diagnosis not present

## 2016-03-25 DIAGNOSIS — G301 Alzheimer's disease with late onset: Secondary | ICD-10-CM | POA: Diagnosis not present

## 2016-03-31 ENCOUNTER — Telehealth: Payer: Self-pay

## 2016-03-31 NOTE — Telephone Encounter (Signed)
Spoke with pt husband asked him if recalled anything specific from 3/12 at 930pm, pts husband stated that she was probably in bed at that time and didn't recall anything specific and pt didn't voice any complaints.

## 2016-04-01 ENCOUNTER — Emergency Department (HOSPITAL_COMMUNITY): Payer: PPO

## 2016-04-01 ENCOUNTER — Emergency Department (HOSPITAL_COMMUNITY)
Admission: EM | Admit: 2016-04-01 | Discharge: 2016-04-01 | Disposition: A | Discharge status: Home / Self Care | Payer: PPO | Attending: Emergency Medicine | Admitting: Emergency Medicine

## 2016-04-01 ENCOUNTER — Encounter (HOSPITAL_COMMUNITY): Payer: Self-pay | Admitting: Emergency Medicine

## 2016-04-01 DIAGNOSIS — Z951 Presence of aortocoronary bypass graft: Secondary | ICD-10-CM | POA: Insufficient documentation

## 2016-04-01 DIAGNOSIS — R1084 Generalized abdominal pain: Secondary | ICD-10-CM

## 2016-04-01 DIAGNOSIS — I252 Old myocardial infarction: Secondary | ICD-10-CM | POA: Insufficient documentation

## 2016-04-01 DIAGNOSIS — F0150 Vascular dementia without behavioral disturbance: Secondary | ICD-10-CM | POA: Diagnosis not present

## 2016-04-01 DIAGNOSIS — I471 Supraventricular tachycardia: Secondary | ICD-10-CM

## 2016-04-01 DIAGNOSIS — I4719 Other supraventricular tachycardia: Secondary | ICD-10-CM

## 2016-04-01 DIAGNOSIS — Z7982 Long term (current) use of aspirin: Secondary | ICD-10-CM | POA: Diagnosis not present

## 2016-04-01 DIAGNOSIS — Z85810 Personal history of malignant neoplasm of tongue: Secondary | ICD-10-CM | POA: Diagnosis not present

## 2016-04-01 DIAGNOSIS — I2510 Atherosclerotic heart disease of native coronary artery without angina pectoris: Secondary | ICD-10-CM | POA: Diagnosis not present

## 2016-04-01 DIAGNOSIS — R109 Unspecified abdominal pain: Secondary | ICD-10-CM | POA: Diagnosis not present

## 2016-04-01 DIAGNOSIS — Z79899 Other long term (current) drug therapy: Secondary | ICD-10-CM | POA: Diagnosis not present

## 2016-04-01 DIAGNOSIS — Z950 Presence of cardiac pacemaker: Secondary | ICD-10-CM | POA: Insufficient documentation

## 2016-04-01 DIAGNOSIS — F29 Unspecified psychosis not due to a substance or known physiological condition: Secondary | ICD-10-CM | POA: Diagnosis not present

## 2016-04-01 LAB — COMPREHENSIVE METABOLIC PANEL
ALT: 7 U/L — ABNORMAL LOW (ref 14–54)
AST: 19 U/L (ref 15–41)
Albumin: 3.6 g/dL (ref 3.5–5.0)
Alkaline Phosphatase: 88 U/L (ref 38–126)
Anion gap: 8 (ref 5–15)
BUN: 12 mg/dL (ref 6–20)
CO2: 27 mmol/L (ref 22–32)
Calcium: 9.1 mg/dL (ref 8.9–10.3)
Chloride: 101 mmol/L (ref 101–111)
Creatinine, Ser: 1 mg/dL (ref 0.44–1.00)
GFR calc Af Amer: >60 mL/min (ref >60)
GFR calc non Af Amer: 56 mL/min — ABNORMAL LOW (ref >60)
Glucose, Bld: 124 mg/dL — ABNORMAL HIGH (ref 65–99)
Potassium: 3.6 mmol/L (ref 3.5–5.1)
Sodium: 136 mmol/L (ref 135–145)
Total Bilirubin: 0.3 mg/dL (ref 0.3–1.2)
Total Protein: 6.8 g/dL (ref 6.5–8.1)

## 2016-04-01 LAB — URINALYSIS, ROUTINE W REFLEX MICROSCOPIC
Bacteria, UA: NONE SEEN
Bilirubin Urine: NEGATIVE
Glucose, UA: NEGATIVE mg/dL
Ketones, ur: NEGATIVE mg/dL
Nitrite: NEGATIVE
Protein, ur: NEGATIVE mg/dL
Specific Gravity, Urine: 1.012 (ref 1.005–1.030)
pH: 7 (ref 5.0–8.0)

## 2016-04-01 LAB — CBC WITH DIFFERENTIAL/PLATELET
Basophils Absolute: 0 10*3/uL (ref 0.0–0.1)
Basophils Relative: 0 %
Eosinophils Absolute: 0.1 10*3/uL (ref 0.0–0.7)
Eosinophils Relative: 1 %
HCT: 40.2 % (ref 36.0–46.0)
Hemoglobin: 13 g/dL (ref 12.0–15.0)
Lymphocytes Relative: 8 %
Lymphs Abs: 0.8 10*3/uL (ref 0.7–4.0)
MCH: 28.1 pg (ref 26.0–34.0)
MCHC: 32.3 g/dL (ref 30.0–36.0)
MCV: 86.8 fL (ref 78.0–100.0)
Monocytes Absolute: 0.7 10*3/uL (ref 0.1–1.0)
Monocytes Relative: 7 %
Neutro Abs: 8.1 10*3/uL — ABNORMAL HIGH (ref 1.7–7.7)
Neutrophils Relative %: 84 %
Platelets: 231 10*3/uL (ref 150–400)
RBC: 4.63 MIL/uL (ref 3.87–5.11)
RDW: 14 % (ref 11.5–15.5)
WBC: 9.6 10*3/uL (ref 4.0–10.5)

## 2016-04-01 LAB — I-STAT CG4 LACTIC ACID, ED: Lactic Acid, Venous: 0.84 mmol/L (ref 0.5–1.9)

## 2016-04-01 MED ORDER — IOPAMIDOL (ISOVUE-300) INJECTION 61%
100.0000 mL | Freq: Once | INTRAVENOUS | Status: AC | PRN
  Administered 2016-04-01: 50 mL via INTRAVENOUS

## 2016-04-01 MED ORDER — IOPAMIDOL (ISOVUE-300) INJECTION 61%
INTRAVENOUS | Status: AC
  Filled 2016-04-01: qty 30

## 2016-04-01 MED ORDER — METOPROLOL TARTRATE 5 MG/5ML IV SOLN
10.0000 mg | Freq: Once | INTRAVENOUS | Status: AC
  Administered 2016-04-01: 10 mg via INTRAVENOUS
  Filled 2016-04-01: qty 10

## 2016-04-01 MED ORDER — IOPAMIDOL (ISOVUE-300) INJECTION 61%
INTRAVENOUS | Status: AC
  Filled 2016-04-01: qty 100

## 2016-04-01 MED ORDER — SODIUM CHLORIDE 0.9 % IV BOLUS (SEPSIS)
500.0000 mL | Freq: Once | INTRAVENOUS | Status: AC
  Administered 2016-04-01: 500 mL via INTRAVENOUS

## 2016-04-01 NOTE — ED Notes (Signed)
Bed: WA04 Expected date:  Expected time:  Means of arrival:  Comments: 71 yo f generalized pain and dementia

## 2016-04-01 NOTE — ED Notes (Signed)
Patient transported to CT 

## 2016-04-01 NOTE — Discharge Instructions (Signed)
Please follow-up with your cardiologist tomorrow inform him of today's visit and all relevant data.  Please inform your primary care provider as well.  Return immediately if any new or worsening signs or symptoms present.

## 2016-04-01 NOTE — ED Triage Notes (Signed)
Per [facility] pt from home, family wanted pt evaluated. Pt has hx of dementia and was c/o generalized paint to family. Pt later became violent and aggressive stating she needed to see a physician. According to EMS pt denied needing to be seen. Pt now stating leg pain for "being thrown around."
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
## 2016-04-02 ENCOUNTER — Ambulatory Visit (INDEPENDENT_AMBULATORY_CARE_PROVIDER_SITE_OTHER): Payer: PPO | Admitting: Physician Assistant

## 2016-04-02 ENCOUNTER — Encounter: Payer: Self-pay | Admitting: Physician Assistant

## 2016-04-02 VITALS — BP 110/72 | HR 68 | Temp 97.5°F | Resp 18 | Wt 83.2 lb

## 2016-04-02 DIAGNOSIS — S22000A Wedge compression fracture of unspecified thoracic vertebra, initial encounter for closed fracture: Secondary | ICD-10-CM

## 2016-04-02 DIAGNOSIS — M4854XA Collapsed vertebra, not elsewhere classified, thoracic region, initial encounter for fracture: Secondary | ICD-10-CM | POA: Diagnosis not present

## 2016-04-02 DIAGNOSIS — Z09 Encounter for follow-up examination after completed treatment for conditions other than malignant neoplasm: Secondary | ICD-10-CM | POA: Diagnosis not present

## 2016-04-02 MED ORDER — HYDROCODONE-ACETAMINOPHEN 5-325 MG PO TABS: 1.0000 | ORAL_TABLET | Freq: Four times a day (QID) | ORAL | 0 refills | Status: DC | PRN

## 2016-04-02 NOTE — Progress Notes (Addendum)
Patient ID: JENILYN MAGANA MRN: [id number], DOB: 04/23/1945, 71 y.o. Date of Encounter: @DATE @  Chief Complaint:  Chief Complaint  Patient presents with  . Back Pain    HPI: 71 y.o. year old female  presents with her husband for visit today.   Her husband states that he had called here a couple of days ago and scheduled this appointment because of the pain the patient was experiencing. In the interim, ended up going to the ER yesterday but the ER told him to keep this appointment as a post ED follow-up visit.  During her evaluation she had a run of tachycardia. She remained asymptomatic and returned to sinus rhythm. She has had pacemake inserted by Dr. Lovena Le. Pt husband aware to f/u with Dr. Lovena Le regarding this.  Reviewed her ED note from 04/01/16. See that  note for details. As part of their evaluation -- CT abdomen pelvis was performed on 04/01/16. Most of the findings were chronic. CT did reveal compression fracture of T12 vertebrae with approximate 50% loss of vertebral body height. New compared to prior study.  Today patient's husband reports that the ED only gave them small quantity of pain pills and that these will run out after just a few days. However says that the ER physician told him that this pain with probably persist for several weeks.  Patient has no other concerns to address today.    Past Medical History:  Diagnosis Date  . Anemia    "I've always been anemic; never had a transfusion" (06/08/2012)  . Asthmatic bronchitis    "as a child; haven't had any since ~ I was 10 yr old" (06/08/2012)  . Bruises easily    d/t taking Plavix and ASA  . CAD (coronary artery disease)    a. 08/26/2011 Acute Ant STEMI/Cath/PCI: LM nl, LAD 100p -> 2.75x23 Xience Xpedition, LCX 20, minor irregs, RCA 20-62mid, EF 30-35%, anterior/apical AK;  b. 05/2012 Cath: LM nl, LAD patent LAD stent, LCX 30ost, RCA dom, nl, EF 65%.  . Complication of anesthesia    slow to wake up  .  Constipation    takes Miralax daily prn  . Dementia   . Diverticulosis   . Dry mouth   . Emphysema of lung ([place])   . Gastritis   . GERD (gastroesophageal reflux disease)    takes Protonix daily  . H/O hiatal hernia   . History of colon polyps   . Hyperlipidemia    takes Crestor nightly  . Hypotension    a. preventing use of ACEI  . Ischemic cardiomyopathy    a. 08/27/2011 Echo: EF 35-40%, mid-dist anteroseptal/apical akinesis, Gr 1 DD, mild-mod TR, PASP 69mmHg  . Oral cancer ([place]) 2003   a. s/p radiation/chemotherapy  . Osteoporosis   . PONV (postoperative nausea and vomiting)   . STEMI (ST elevation myocardial infarction) ([place]) 08/2011   anterior/notes 09/07/2011 (06/08/2012)  . Subdural hematoma ([place])    small after fall at [facility]  . T12 compression fracture ([place])    after fall at [facility]  . Vertigo    occasionally     Home Meds: Outpatient Medications Prior to Visit  Medication Sig Dispense Refill  . aspirin EC 81 MG tablet Take 1 tablet (81 mg total) by mouth daily. (Patient not taking: Reported on 04/02/2016) 90 tablet 3  . folic acid (FOLVITE) 1 MG tablet Take 1 mg by mouth daily.    Marland Kitchen levothyroxine (SYNTHROID, LEVOTHROID) 25 MCG tablet TAKE  ONE TABLET BY MOUTH ONCE DAILY BEFORE  BREAKFAST (Patient not taking: Reported on 04/02/2016) 30 tablet 2  . LORazepam (ATIVAN) 0.5 MG tablet Take 1 tablet (0.5 mg total) by mouth 2 (two) times daily as needed for anxiety (panic attack). (Patient not taking: Reported on 04/02/2016) 10 tablet 0  . nitroGLYCERIN (NITROSTAT) 0.4 MG SL tablet Place 1 tablet (0.4 mg total) under the tongue every 5 (five) minutes x 3 doses as needed for chest pain. (Patient not taking: Reported on 04/02/2016) 30 tablet 3  . nystatin (MYCOSTATIN) 100000 UNIT/ML suspension Take 5 mLs (500,000 Units total) by mouth 4 (four) times daily. (Patient not taking: Reported on 04/02/2016) 60 mL 0  . oxyCODONE (ROXICODONE) 5 MG immediate release tablet Take 1 tablet  (5 mg total) by mouth every 4 (four) hours as needed for severe pain. 30 tablet 0  . pantoprazole (PROTONIX) 40 MG tablet Take 1 tablet (40 mg total) by mouth daily before breakfast. (Patient not taking: Reported on 04/02/2016) 90 tablet 3  . polyethylene glycol (MIRALAX / GLYCOLAX) packet Take 17 g by mouth daily. (Patient not taking: Reported on 04/02/2016) 14 each 0  . rosuvastatin (CRESTOR) 10 MG tablet Take 1 tablet (10 mg total) by mouth daily. (Patient not taking: Reported on 04/02/2016) 90 tablet 3  . senna-docusate (SENOKOT-S) 8.6-50 MG tablet Take 2 tablets by mouth at bedtime as needed for mild constipation or moderate constipation. (Patient not taking: Reported on 04/02/2016) 30 tablet 0  . sertraline (ZOLOFT) 100 MG tablet Take 100 mg by mouth daily.   5   No facility-administered medications prior to visit.     Allergies:  Allergies  Allergen Reactions  . Nubain [Nalbuphine Hcl] Other (See Comments)    "headache & disoriented"    . Ampicillin Itching and Dermatitis  . Codeine Itching and Nausea And Vomiting    Other reaction(s): GI Upset (intolerance)  . Cisapride Other (See Comments)    Social History   Social History  . Marital status: Married    Spouse name: N/A  . Number of children: 4  . Years of education: N/A   Occupational History  . Retired    Social History Main Topics  . Smoking status: Never Smoker  . Smokeless tobacco: Never Used  . Alcohol use No  . Drug use: No  . Sexual activity: No   Other Topics Concern  . Not on file   Social History Narrative  . No narrative on file    Family History  Problem Relation Age of Onset  . Dementia Mother   . Heart disease Father   . Cancer Father     prostate  . Heart disease Paternal Grandfather   . Colon cancer Neg Hx      Review of Systems:  See HPI for pertinent ROS. All other ROS negative.    Physical Exam: Blood pressure 110/72, pulse 68, temperature 97.5 F (36.4 C), temperature source  Oral, resp. rate 18, weight 83 lb 3.2 oz (37.7 kg), SpO2 98 %., Body mass index is 16.8 kg/m. General: Very petite white female. Dementia. Appears in no acute distress. Neck: Supple. No thyromegaly. No lymphadenopathy. Lungs: Clear bilaterally to auscultation without wheezes, rales, or rhonchi. Breathing is unlabored. Heart: RRR with S1 S2. No murmurs, rubs, or gallops. Abdomen: Soft, non-tender, non-distended with normoactive bowel sounds. No hepatomegaly. No rebound/guarding. No obvious abdominal masses. Musculoskeletal:  Strength and tone normal for age. Extremities/Skin: Warm and dry.  Neuro: Alert and oriented X  3. Moves all extremities spontaneously. Gait is normal. CNII-XII grossly in tact. Psych:  Responds to questions appropriately with a normal affect.     ASSESSMENT AND PLAN:  71 y.o. year old female with   1. ED follow-up  2. Compression fracture of body of thoracic vertebra (HCC) - HYDROcodone-acetaminophen (NORCO/VICODIN) 5-325 MG tablet; Take 1 tablet by mouth every 6 (six) hours as needed.  Dispense: 30 tablet; Refill: 0  3. Tachycardia During her evaluation she had a run of tachycardia. She remained asymptomatic and returned to sinus rhythm. She has had pacemake inserted by Dr. Lovena Le. Pt husband aware to f/u with Dr. Lovena Le regarding this.  Signed, [address] [place], [place], [facility] 04/02/2016 3:12 PM

## 2016-04-07 ENCOUNTER — Telehealth: Payer: Self-pay | Admitting: Family Medicine

## 2016-04-07 DIAGNOSIS — G301 Alzheimer's disease with late onset: Secondary | ICD-10-CM | POA: Diagnosis not present

## 2016-04-07 DIAGNOSIS — F0391 Unspecified dementia with behavioral disturbance: Secondary | ICD-10-CM

## 2016-04-07 DIAGNOSIS — F0281 Dementia in other diseases classified elsewhere with behavioral disturbance: Secondary | ICD-10-CM | POA: Diagnosis not present

## 2016-04-07 NOTE — Telephone Encounter (Signed)
Pt's daughter called and states that pt has been declining in health and in behavior and would like to know if we would / could do a hospice referral for her? OK to put in referral?  Call Pricilla Holm [phone]
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
## 2016-04-08 ENCOUNTER — Emergency Department (HOSPITAL_COMMUNITY)
Admission: EM | Admit: 2016-04-08 | Discharge: 2016-04-10 | Disposition: A | Discharge status: Other Acute Inpatient Hospital | Payer: PPO | Attending: Emergency Medicine | Admitting: Emergency Medicine

## 2016-04-08 ENCOUNTER — Encounter (HOSPITAL_COMMUNITY): Payer: Self-pay

## 2016-04-08 DIAGNOSIS — F0390 Unspecified dementia without behavioral disturbance: Secondary | ICD-10-CM | POA: Diagnosis not present

## 2016-04-08 DIAGNOSIS — R4189 Other symptoms and signs involving cognitive functions and awareness: Secondary | ICD-10-CM | POA: Diagnosis not present

## 2016-04-08 DIAGNOSIS — Z955 Presence of coronary angioplasty implant and graft: Secondary | ICD-10-CM | POA: Insufficient documentation

## 2016-04-08 DIAGNOSIS — Z79899 Other long term (current) drug therapy: Secondary | ICD-10-CM | POA: Diagnosis not present

## 2016-04-08 DIAGNOSIS — I2510 Atherosclerotic heart disease of native coronary artery without angina pectoris: Secondary | ICD-10-CM | POA: Insufficient documentation

## 2016-04-08 DIAGNOSIS — Z5181 Encounter for therapeutic drug level monitoring: Secondary | ICD-10-CM | POA: Diagnosis not present

## 2016-04-08 DIAGNOSIS — F03918 Unspecified dementia, unspecified severity, with other behavioral disturbance: Secondary | ICD-10-CM | POA: Diagnosis present

## 2016-04-08 DIAGNOSIS — Z85819 Personal history of malignant neoplasm of unspecified site of lip, oral cavity, and pharynx: Secondary | ICD-10-CM | POA: Diagnosis not present

## 2016-04-08 DIAGNOSIS — R441 Visual hallucinations: Secondary | ICD-10-CM

## 2016-04-08 DIAGNOSIS — F0391 Unspecified dementia with behavioral disturbance: Secondary | ICD-10-CM | POA: Diagnosis not present

## 2016-04-08 DIAGNOSIS — J45909 Unspecified asthma, uncomplicated: Secondary | ICD-10-CM | POA: Insufficient documentation

## 2016-04-08 DIAGNOSIS — Z950 Presence of cardiac pacemaker: Secondary | ICD-10-CM | POA: Insufficient documentation

## 2016-04-08 DIAGNOSIS — R4689 Other symptoms and signs involving appearance and behavior: Secondary | ICD-10-CM

## 2016-04-08 DIAGNOSIS — F0281 Dementia in other diseases classified elsewhere with behavioral disturbance: Secondary | ICD-10-CM | POA: Diagnosis not present

## 2016-04-08 DIAGNOSIS — I252 Old myocardial infarction: Secondary | ICD-10-CM | POA: Diagnosis not present

## 2016-04-08 DIAGNOSIS — R45851 Suicidal ideations: Secondary | ICD-10-CM | POA: Diagnosis not present

## 2016-04-08 DIAGNOSIS — J984 Other disorders of lung: Secondary | ICD-10-CM | POA: Diagnosis not present

## 2016-04-08 DIAGNOSIS — Z01818 Encounter for other preprocedural examination: Secondary | ICD-10-CM

## 2016-04-08 DIAGNOSIS — Z810 Family history of intellectual disabilities: Secondary | ICD-10-CM | POA: Diagnosis not present

## 2016-04-08 LAB — CBC
HEMATOCRIT: 42.2 % (ref 36.0–46.0)
Hemoglobin: 13.8 g/dL (ref 12.0–15.0)
MCH: 28 pg (ref 26.0–34.0)
MCHC: 32.7 g/dL (ref 30.0–36.0)
MCV: 85.8 fL (ref 78.0–100.0)
PLATELETS: 290 10*3/uL (ref 150–400)
RBC: 4.92 MIL/uL (ref 3.87–5.11)
RDW: 13.9 % (ref 11.5–15.5)
WBC: 6.3 10*3/uL (ref 4.0–10.5)

## 2016-04-08 LAB — ACETAMINOPHEN LEVEL: Acetaminophen (Tylenol), Serum: <10 ug/mL — ABNORMAL LOW (ref 10–30)

## 2016-04-08 LAB — RAPID URINE DRUG SCREEN, HOSP PERFORMED
Amphetamines: NOT DETECTED
Barbiturates: POSITIVE — AB
Benzodiazepines: NOT DETECTED
Cocaine: NOT DETECTED
OPIATES: NOT DETECTED
TETRAHYDROCANNABINOL: NOT DETECTED

## 2016-04-08 LAB — URINALYSIS, ROUTINE W REFLEX MICROSCOPIC
BACTERIA UA: NONE SEEN
BILIRUBIN URINE: NEGATIVE
Glucose, UA: NEGATIVE mg/dL
KETONES UR: 5 mg/dL — AB
NITRITE: NEGATIVE
Protein, ur: NEGATIVE mg/dL
Specific Gravity, Urine: 1.019 (ref 1.005–1.030)
pH: 5 (ref 5.0–8.0)

## 2016-04-08 LAB — COMPREHENSIVE METABOLIC PANEL
ALBUMIN: 4 g/dL (ref 3.5–5.0)
ALK PHOS: 88 U/L (ref 38–126)
ALT: 9 U/L — AB (ref 14–54)
AST: 21 U/L (ref 15–41)
Anion gap: 9 (ref 5–15)
BILIRUBIN TOTAL: 0.6 mg/dL (ref 0.3–1.2)
BUN: 14 mg/dL (ref 6–20)
CO2: 24 mmol/L (ref 22–32)
CREATININE: 1.15 mg/dL — AB (ref 0.44–1.00)
Calcium: 9.5 mg/dL (ref 8.9–10.3)
Chloride: 105 mmol/L (ref 101–111)
GFR calc Af Amer: 55 mL/min — ABNORMAL LOW (ref >60)
GFR, EST NON AFRICAN AMERICAN: 47 mL/min — AB (ref >60)
GLUCOSE: 98 mg/dL (ref 65–99)
Potassium: 3.6 mmol/L (ref 3.5–5.1)
Sodium: 138 mmol/L (ref 135–145)
TOTAL PROTEIN: 7.8 g/dL (ref 6.5–8.1)

## 2016-04-08 LAB — ETHANOL

## 2016-04-08 LAB — SALICYLATE LEVEL: Salicylate Lvl: <7 mg/dL (ref 2.8–30.0)

## 2016-04-08 MED ORDER — LORAZEPAM 0.5 MG PO TABS
0.5000 mg | ORAL_TABLET | Freq: Two times a day (BID) | ORAL | Status: DC | PRN
  Administered 2016-04-08: 0.5 mg via ORAL
  Filled 2016-04-08: qty 1

## 2016-04-08 MED ORDER — PANTOPRAZOLE SODIUM 40 MG PO TBEC
40.0000 mg | DELAYED_RELEASE_TABLET | Freq: Every day | ORAL | Status: DC
  Administered 2016-04-09 – 2016-04-10 (×2): 40 mg via ORAL
  Filled 2016-04-08 (×2): qty 1

## 2016-04-08 MED ORDER — SERTRALINE HCL 50 MG PO TABS
100.0000 mg | ORAL_TABLET | Freq: Every day | ORAL | Status: DC
  Administered 2016-04-09 – 2016-04-10 (×2): 100 mg via ORAL
  Filled 2016-04-08 (×2): qty 2

## 2016-04-08 MED ORDER — ASPIRIN EC 81 MG PO TBEC
81.0000 mg | DELAYED_RELEASE_TABLET | Freq: Every day | ORAL | Status: DC
  Administered 2016-04-09 – 2016-04-10 (×2): 81 mg via ORAL
  Filled 2016-04-08 (×2): qty 1

## 2016-04-08 MED ORDER — OXYCODONE HCL 5 MG PO TABS
5.0000 mg | ORAL_TABLET | ORAL | Status: DC | PRN
  Administered 2016-04-08: 5 mg via ORAL
  Filled 2016-04-08: qty 1
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
## 2016-04-08 NOTE — ED Notes (Signed)
Pt ambulated to the restroom with a steady gait.
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
## 2016-04-08 NOTE — Telephone Encounter (Signed)
Spoke to pt's husband and he would like for Hospice to come in for pt. Referral placed.

## 2016-04-08 NOTE — ED Notes (Signed)
Pt changing into scrubs 
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
## 2016-04-08 NOTE — ED Notes (Signed)
Pamela Kidd (Daughter-in-law): [phone]

## 2016-04-08 NOTE — ED Notes (Addendum)
Beige/white shirt with brown and black scroll design Black zipper hoodie Beige bra Striped sock Black sock Blue jeans Butterfly key hooked to pants Pink and white pearl-like silver/white necklace Black and purple stone silver/white necklace Yellow necklace with white stones Yellow bangle bracelet with white stones White bracelet with black and cream design and white stones Yellow watch Yellow watch Yellow bracelet with white stones Cream pearl like bracelet  1 pair of black slide shoes ** In cabinet labeled 16-18**

## 2016-04-09 ENCOUNTER — Emergency Department (HOSPITAL_COMMUNITY): Payer: PPO

## 2016-04-09 DIAGNOSIS — R45851 Suicidal ideations: Secondary | ICD-10-CM

## 2016-04-09 DIAGNOSIS — J984 Other disorders of lung: Secondary | ICD-10-CM | POA: Diagnosis not present

## 2016-04-09 DIAGNOSIS — F0281 Dementia in other diseases classified elsewhere with behavioral disturbance: Secondary | ICD-10-CM

## 2016-04-09 DIAGNOSIS — Z810 Family history of intellectual disabilities: Secondary | ICD-10-CM | POA: Diagnosis not present

## 2016-04-09 DIAGNOSIS — Z79899 Other long term (current) drug therapy: Secondary | ICD-10-CM | POA: Diagnosis not present

## 2016-04-09 MED ORDER — ASENAPINE MALEATE 5 MG SL SUBL: 5.0000 mg | SUBLINGUAL_TABLET | Freq: Two times a day (BID) | SUBLINGUAL | Status: DC | PRN

## 2016-04-09 MED ORDER — DONEPEZIL HCL 5 MG PO TABS
5.0000 mg | ORAL_TABLET | Freq: Every day | ORAL | Status: DC
  Administered 2016-04-09: 5 mg via ORAL
  Filled 2016-04-09: qty 1

## 2016-04-09 MED ORDER — LEVOTHYROXINE SODIUM 25 MCG PO TABS
25.0000 ug | ORAL_TABLET | Freq: Every day | ORAL | Status: DC
  Administered 2016-04-10: 25 ug via ORAL
  Filled 2016-04-09: qty 1

## 2016-04-09 MED ORDER — ENSURE ENLIVE PO LIQD
237.0000 mL | Freq: Two times a day (BID) | ORAL | Status: DC
  Administered 2016-04-10: 237 mL via ORAL
  Filled 2016-04-09 (×3): qty 237

## 2016-04-09 MED ORDER — QUETIAPINE FUMARATE 25 MG PO TABS
25.0000 mg | ORAL_TABLET | Freq: Every day | ORAL | Status: DC
  Administered 2016-04-09: 25 mg via ORAL
  Filled 2016-04-09: qty 1

## 2016-04-09 NOTE — BH Assessment (Signed)
[place] Assessment Progress Note  Per Corena Pilgrim, MD, this pt requires psychiatric hospitalization at this time.  He also finds that pt meets criteria for IVC, which he has initiated.  IVC documents have been faxed to [facility], and at 11:53 Rica Koyanagi confirmed receipt.  The following facilities have been contacted to seek placement for this pt, with results as noted:  Beds available, information sent, decision pending:  [place]:  [facility] [place], [place] Triage Specialist [phone]

## 2016-04-09 NOTE — ED Notes (Signed)
Granddaughter visiting patient now.

## 2016-04-09 NOTE — ED Notes (Signed)
Patient's husband at bedside visiting.  Patient calm and cooperative.

## 2016-04-09 NOTE — Consult Note (Signed)
[facility] Consult   Reason for Consult: psychiatric evaluation Referring Physician:  EDP Patient Identification: Pamela Kidd MRN:  [id number] Principal Diagnosis: Dementia with behavioral disturbance Diagnosis:   Patient Active Problem List   Diagnosis Date Noted  . Dementia with behavioral disturbance [F03.91]     Priority: High  . Hospital discharge follow-up [Z09] 08/07/2015  . Pressure ulcer [L89.90] 08/01/2015  . CAP (community acquired pneumonia) [J18.9] 07/30/2015  . Ataxia [R27.0] 07/30/2015  . Dysphagia, oropharyngeal [R13.12] 11/20/2014  . Gastritis [K29.70] 05/07/2014  . Difficulty hearing [H91.90] 04/16/2014  . Atypical chest pain [R07.89] 04/14/2014  . Near syncope [R55] 03/09/2014  . Chronic cholecystitis with calculus [K80.10] 08/01/2012  . Oral cancer ([place]) [C06.9]   . Hyperlipidemia [E78.5]   . Hypotension [I95.9] 11/04/2011  . CAD (coronary artery disease), native coronary artery [I25.10] 10/07/2011  . Old anterior myocardial infarction [I25.2] 08/26/2011  . Ventricular tachycardia ([place]) [I47.2] 08/26/2011  . Ischemic cardiomyopathy [I25.5] 08/26/2011  . 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 female patient admitted with psychosis/aggression.  HPI:  Patient with history of CAD, GERD, diverticulosis, and dementia who was brought to the emergency room by her husband for evaluation of worsening dementia symptoms with associated delusions and visual hallucinations. Patient is a poor historian, history obtained from her husband revealed worsening physical aggression, anger outburst and accusation of husband cheating on her  with other women which she has seen in the house. Patient also reports that she has seen strangers come in and out of her home.  Past Psychiatric History: as above  Risk to Self: Suicidal Ideation: No Suicidal  Intent: No Is patient at risk for suicide?: No Suicidal Plan?: No Access to Means: No Intentional Self Injurious Behavior: None Risk to Others: Homicidal Ideation: No Thoughts of Harm to Others: No Current Homicidal Intent: No Current Homicidal Plan: No Access to Homicidal Means: No History of harm to others?: No Assessment of Violence: None Noted Does patient have access to weapons?: No Criminal Charges Pending?: No Does patient have a court date: No Prior Inpatient Therapy: Prior Inpatient Therapy: No Prior Outpatient Therapy: Prior Outpatient Therapy: No Does patient have an ACCT team?: No Does patient have Intensive In-House Services?  : No Does patient have [facility] services? : No Does patient have P4[facility] services?: No  Past Medical History:  Past Medical History:  Diagnosis Date  . Anemia    "I've always been anemic; never had a transfusion" (06/08/2012)  . Asthmatic bronchitis    "as a child; haven't had any since ~ I was 25 yr old" (06/08/2012)  . Bruises easily    d/t taking Plavix and ASA  . CAD (coronary artery disease)    a. 08/26/2011 Acute Ant STEMI/Cath/PCI: LM nl, LAD 100p -> 2.75x23 Xience Xpedition, LCX 20, minor irregs, RCA 20-33md, EF 30-35%, anterior/apical AK;  b. 05/2012 Cath: LM nl, LAD patent LAD stent, LCX 30ost, RCA dom, nl, EF 65%.  . Complication of anesthesia    slow to wake up  . Constipation    takes Miralax daily prn  . Dementia   . Diverticulosis   . Dry mouth   . Emphysema of lung ([place]   . Gastritis   . GERD (gastroesophageal reflux disease)    takes Protonix daily  . H/O hiatal hernia   . History of colon polyps   .  Hyperlipidemia    takes Crestor nightly  . Hypotension    a. preventing use of ACEI  . Ischemic cardiomyopathy    a. 08/27/2011 Echo: EF 35-40%, mid-dist anteroseptal/apical akinesis, Gr 1 DD, mild-mod TR, PASP 77mHg  . Oral cancer ([place] 2003   a. s/p radiation/chemotherapy  . Osteoporosis   . PONV (postoperative nausea  and vomiting)   . STEMI (ST elevation myocardial infarction) ([place] 08/2011   anterior/notes 09/07/2011 (06/08/2012)  . Subdural hematoma ([facility]    [facility] [place] [place]    [facility] . Vertigo    occasionally    Past Surgical History:  Procedure Laterality Date  . BUNIONECTOMY Bilateral 1970's  . CARDIAC CATHETERIZATION  06/09/2012   patent prox LAD stent, minor nonobstructive LAD disease, 30% ostial LCx, and otherwise widely patent cors; LVEF 65%  . CHOLECYSTECTOMY  08/18/2012  . CHOLECYSTECTOMY N/A 08/18/2012   Procedure: LAPAROSCOPIC CHOLECYSTECTOMY WITH INTRAOPERATIVE CHOLANGIOGRAM;  Surgeon: MImogene Burn TGeorgette Dover MD;  [facility]  Service: General;  Laterality: N/A;  . COLONOSCOPY    . CORONARY ANGIOPLASTY WITH STENT PLACEMENT  08/2011   "1" (06/08/2012)  . EP IMPLANTABLE DEVICE N/A 07/31/2015   Procedure: Pacemaker Implant;  Surgeon: GEvans Lance MD;  [facility];  Service: Cardiovascular;  Laterality: N/A;  . LEFT HEART CATHETERIZATION WITH CORONARY ANGIOGRAM N/A 08/26/2011   Procedure: LEFT HEART CATHETERIZATION WITH CORONARY ANGIOGRAM;  Surgeon: Peter M JMartinique MD;  [facility];  Service: Cardiovascular;  Laterality: N/A;  . LEFT HEART CATHETERIZATION WITH CORONARY ANGIOGRAM N/A 06/09/2012   Procedure: LEFT HEART CATHETERIZATION WITH CORONARY ANGIOGRAM;  Surgeon: MSherren Mocha MD;  [facility];  Service: Cardiovascular;  Laterality: N/A;  . PACEMAKER INSERTION     St Jude, dual chamber serial number, [id number] inserted 7/12 by Dr TLovena Le . PEG PLACEMENT     in 2003 and then removed  . PERCUTANEOUS CORONARY STENT INTERVENTION (PCI-S) N/A 08/26/2011   Procedure: PERCUTANEOUS CORONARY STENT INTERVENTION (PCI-S);  Surgeon: Peter M JMartinique MD;  [facility];  Service: Cardiovascular;  Laterality: N/A;  . SHOULDER OPEN ROTATOR CUFF REPAIR Left 1990's  . TONSILLECTOMY  ~ 1951  .  TUBAL LIGATION  1975  . VAGINAL HYSTERECTOMY  ~ 1989   partial   Family History:  Family History  Problem Relation Age of Onset  . Dementia Mother   . Heart disease Father   . Cancer Father     prostate  . Heart disease Paternal Grandfather   . Colon cancer Neg Hx    Family Psychiatric  History:  Social History:  History  Alcohol Use No     History  Drug Use No    Social History   Social History  . Marital status: Married    Spouse name: N/A  . Number of children: 4  . Years of education: N/A   Occupational History  . Retired    Social History Main Topics  . Smoking status: Never Smoker  . Smokeless tobacco: Never Used  . Alcohol use No  . Drug use: No  . Sexual activity: No   Other Topics Concern  . None   Social History Narrative  . None   Additional Social History:    Allergies:   Allergies  Allergen Reactions  . Nubain [Nalbuphine Hcl] Other (See Comments)    "headache & disoriented"    . Ampicillin  Itching and Dermatitis  . Codeine Itching and Nausea And Vomiting    Other reaction(s): GI Upset (intolerance)  . Cisapride Other (See Comments)    Labs:  Results for orders placed or performed during the hospital encounter of 04/08/16 (from the past 48 hour(s))  Comprehensive metabolic panel     Status: Abnormal   Collection Time: 04/08/16  3:36 PM  Result Value Ref Range   Sodium 138 135 - 145 mmol/L   Potassium 3.6 3.5 - 5.1 mmol/L   Chloride 105 101 - 111 mmol/L   CO2 24 22 - 32 mmol/L   Glucose, Bld 98 65 - 99 mg/dL   BUN 14 6 - 20 mg/dL   Creatinine, Ser 1.15 (H) 0.44 - 1.00 mg/dL   Calcium 9.5 8.9 - 10.3 mg/dL   Total Protein 7.8 6.5 - 8.1 g/dL   Albumin 4.0 3.5 - 5.0 g/dL   AST 21 15 - 41 U/L   ALT 9 (L) 14 - 54 U/L   Alkaline Phosphatase 88 38 - 126 U/L   Total Bilirubin 0.6 0.3 - 1.2 mg/dL   GFR calc non Af Amer 47 (L) >60 mL/min   GFR calc Af Amer 55 (L) >60 mL/min    Comment: (NOTE) The eGFR has been calculated using the  CKD EPI equation. This calculation has not been validated in all clinical situations. eGFR's persistently <60 mL/min signify possible Chronic Kidney Disease.    Anion gap 9 5 - 15  Ethanol     Status: None   Collection Time: 04/08/16  3:36 PM  Result Value Ref Range   Alcohol, Ethyl (B) <5 <5 mg/dL    Comment:        LOWEST DETECTABLE LIMIT FOR SERUM ALCOHOL IS 5 mg/dL FOR MEDICAL PURPOSES ONLY   Salicylate level     Status: None   Collection Time: 04/08/16  3:36 PM  Result Value Ref Range   Salicylate Lvl <6.3 2.8 - 30.0 mg/dL  Acetaminophen level     Status: Abnormal   Collection Time: 04/08/16  3:36 PM  Result Value Ref Range   Acetaminophen (Tylenol), Serum <10 (L) 10 - 30 ug/mL    Comment:        THERAPEUTIC CONCENTRATIONS VARY SIGNIFICANTLY. A RANGE OF 10-30 ug/mL MAY BE AN EFFECTIVE CONCENTRATION FOR MANY PATIENTS. HOWEVER, SOME ARE BEST TREATED AT CONCENTRATIONS OUTSIDE THIS RANGE. ACETAMINOPHEN CONCENTRATIONS >150 ug/mL AT 4 HOURS AFTER INGESTION AND >50 ug/mL AT 12 HOURS AFTER INGESTION ARE OFTEN ASSOCIATED WITH TOXIC REACTIONS.   cbc     Status: None   Collection Time: 04/08/16  3:36 PM  Result Value Ref Range   WBC 6.3 4.0 - 10.5 K/uL   RBC 4.92 3.87 - 5.11 MIL/uL   Hemoglobin 13.8 12.0 - 15.0 g/dL   HCT 42.2 36.0 - 46.0 %   MCV 85.8 78.0 - 100.0 fL   MCH 28.0 26.0 - 34.0 pg   MCHC 32.7 30.0 - 36.0 g/dL   RDW 13.9 11.5 - 15.5 %   Platelets 290 150 - 400 K/uL  Rapid urine drug screen (hospital performed)     Status: Abnormal   Collection Time: 04/08/16  3:36 PM  Result Value Ref Range   Opiates NONE DETECTED NONE DETECTED   Cocaine NONE DETECTED NONE DETECTED   Benzodiazepines NONE DETECTED NONE DETECTED   Amphetamines NONE DETECTED NONE DETECTED   Tetrahydrocannabinol NONE DETECTED NONE DETECTED   Barbiturates POSITIVE (A) NONE DETECTED    Comment:  DRUG SCREEN FOR MEDICAL PURPOSES ONLY.  IF CONFIRMATION IS NEEDED FOR ANY PURPOSE, NOTIFY  LAB WITHIN 5 DAYS.        LOWEST DETECTABLE LIMITS FOR URINE DRUG SCREEN Drug Class       Cutoff (ng/mL) Amphetamine      1000 Barbiturate      200 Benzodiazepine   063 Tricyclics       016 Opiates          300 Cocaine          300 THC              50   Urinalysis, Routine w reflex microscopic     Status: Abnormal   Collection Time: 04/08/16  3:38 PM  Result Value Ref Range   Color, Urine YELLOW YELLOW   APPearance CLEAR CLEAR   Specific Gravity, Urine 1.019 1.005 - 1.030   pH 5.0 5.0 - 8.0   Glucose, UA NEGATIVE NEGATIVE mg/dL   Hgb urine dipstick MODERATE (A) NEGATIVE   Bilirubin Urine NEGATIVE NEGATIVE   Ketones, ur 5 (A) NEGATIVE mg/dL   Protein, ur NEGATIVE NEGATIVE mg/dL   Nitrite NEGATIVE NEGATIVE   Leukocytes, UA MODERATE (A) NEGATIVE   RBC / HPF 0-5 0 - 5 RBC/hpf   WBC, UA 0-5 0 - 5 WBC/hpf   Bacteria, UA NONE SEEN NONE SEEN   Squamous Epithelial / LPF 0-5 (A) NONE SEEN   Mucous PRESENT     Current Facility-Administered Medications  Medication Dose Route Frequency Provider Last Rate Last Dose  . aspirin EC tablet 81 mg  81 mg Oral Daily Margette Fast, MD   81 mg at 04/09/16 0931  . donepezil (ARICEPT) tablet 5 mg  5 mg Oral QHS Corena Pilgrim, MD      . Derrill Memo ON 04/10/2016] levothyroxine (SYNTHROID, LEVOTHROID) tablet 25 mcg  25 mcg Oral QAC breakfast  , MD      . pantoprazole (PROTONIX) EC tablet 40 mg  40 mg Oral QAC breakfast Margette Fast, MD   40 mg at 04/09/16 0109  . sertraline (ZOLOFT) tablet 100 mg  100 mg Oral Daily Margette Fast, MD   100 mg at 04/09/16 0930   Current Outpatient Prescriptions  Medication Sig Dispense Refill  . HYDROcodone-acetaminophen (NORCO/VICODIN) 5-325 MG tablet Take 1 tablet by mouth every 6 (six) hours as needed. 30 tablet 0  . folic acid (FOLVITE) 1 MG tablet Take 1 mg by mouth daily.    . sertraline (ZOLOFT) 100 MG tablet Take 100 mg by mouth daily.   5    Musculoskeletal: Strength & Muscle Tone: within  normal limits Gait & Station: unsteady Patient leans: N/A  Psychiatric Specialty Exam: Physical Exam  Psychiatric: Her mood appears anxious. Her affect is labile. Her speech is delayed. She is agitated, aggressive, actively hallucinating and combative. Thought content is paranoid. Cognition and memory are impaired. She expresses impulsivity. She expresses suicidal ideation.    Review of Systems  Constitutional: Negative.   HENT: Negative.   Eyes: Negative.   Respiratory: Negative.   Cardiovascular: Negative.   Gastrointestinal: Negative.   Skin: Negative.   Psychiatric/Behavioral: Positive for hallucinations and suicidal ideas. The patient is nervous/anxious.     Blood pressure (!) 97/50, pulse 75, temperature 98.7 F (37.1 C), temperature source Oral, resp. rate 16, height 4' 11" (1.499 m), weight 37.6 kg (83 lb), SpO2 99 %.Body mass index is 16.76 kg/m.  General Appearance: Casual  Eye Contact:  Minimal  Speech:  Clear and Coherent  Volume:  Increased  Mood:  Angry, Anxious and Irritable  Affect:  Labile  Thought Process:  Disorganized  Orientation:  Other:  only to person  Thought Content:  Illogical, Delusions and Paranoid Ideation  Suicidal Thoughts:  Yes.  without intent/plan  Homicidal Thoughts:  No  Memory:  Immediate;   Poor Recent;   Poor Remote;   Good  Judgement:  Poor  Insight:  Lacking  Psychomotor Activity:  Decreased and Restlessness  Concentration:  Concentration: Fair and Attention Span: Poor  Recall:  Poor  Fund of Knowledge:  unable to assess  Language:  Good  Akathisia:  No  Handed:  Right  AIMS (if indicated):     Assets:  Social Support  ADL's:  Impaired  Cognition:  Impaired,  Moderate  Sleep:   fair     Treatment Plan Summary: Daily contact with patient to assess and evaluate symptoms and progress in treatment and Medication management  Continue Zoloft 100 mg daily Add Seroquel 25 mg qhs for psychosis/aggression  Disposition: Recommend  psychiatric Inpatient admission when medically cleared.  Corena Pilgrim, MD 04/09/2016 11:05 AM

## 2016-04-10 DIAGNOSIS — I2510 Atherosclerotic heart disease of native coronary artery without angina pectoris: Secondary | ICD-10-CM | POA: Diagnosis not present

## 2016-04-10 DIAGNOSIS — F0391 Unspecified dementia with behavioral disturbance: Secondary | ICD-10-CM | POA: Diagnosis not present

## 2016-04-10 DIAGNOSIS — E7800 Pure hypercholesterolemia, unspecified: Secondary | ICD-10-CM | POA: Diagnosis not present

## 2016-04-10 DIAGNOSIS — Z923 Personal history of irradiation: Secondary | ICD-10-CM | POA: Diagnosis not present

## 2016-04-10 DIAGNOSIS — Z885 Allergy status to narcotic agent status: Secondary | ICD-10-CM | POA: Diagnosis not present

## 2016-04-10 DIAGNOSIS — M810 Age-related osteoporosis without current pathological fracture: Secondary | ICD-10-CM | POA: Diagnosis not present

## 2016-04-10 DIAGNOSIS — F0390 Unspecified dementia without behavioral disturbance: Secondary | ICD-10-CM | POA: Diagnosis not present

## 2016-04-10 DIAGNOSIS — Z810 Family history of intellectual disabilities: Secondary | ICD-10-CM | POA: Diagnosis not present

## 2016-04-10 DIAGNOSIS — I252 Old myocardial infarction: Secondary | ICD-10-CM | POA: Diagnosis not present

## 2016-04-10 DIAGNOSIS — Z888 Allergy status to other drugs, medicaments and biological substances status: Secondary | ICD-10-CM | POA: Diagnosis not present

## 2016-04-10 DIAGNOSIS — Z79899 Other long term (current) drug therapy: Secondary | ICD-10-CM | POA: Diagnosis not present

## 2016-04-10 DIAGNOSIS — Z881 Allergy status to other antibiotic agents status: Secondary | ICD-10-CM | POA: Diagnosis not present

## 2016-04-10 DIAGNOSIS — K219 Gastro-esophageal reflux disease without esophagitis: Secondary | ICD-10-CM | POA: Diagnosis not present

## 2016-04-10 DIAGNOSIS — H9190 Unspecified hearing loss, unspecified ear: Secondary | ICD-10-CM | POA: Diagnosis not present

## 2016-04-10 DIAGNOSIS — J449 Chronic obstructive pulmonary disease, unspecified: Secondary | ICD-10-CM | POA: Diagnosis not present

## 2016-04-10 DIAGNOSIS — E785 Hyperlipidemia, unspecified: Secondary | ICD-10-CM | POA: Diagnosis not present

## 2016-04-10 DIAGNOSIS — F0281 Dementia in other diseases classified elsewhere with behavioral disturbance: Secondary | ICD-10-CM | POA: Diagnosis not present

## 2016-04-10 DIAGNOSIS — Z7982 Long term (current) use of aspirin: Secondary | ICD-10-CM | POA: Diagnosis not present

## 2016-04-10 DIAGNOSIS — G309 Alzheimer's disease, unspecified: Secondary | ICD-10-CM | POA: Diagnosis not present

## 2016-04-10 DIAGNOSIS — G3183 Dementia with Lewy bodies: Secondary | ICD-10-CM | POA: Diagnosis not present

## 2016-04-10 DIAGNOSIS — R45851 Suicidal ideations: Secondary | ICD-10-CM | POA: Diagnosis not present

## 2016-04-10 DIAGNOSIS — F0151 Vascular dementia with behavioral disturbance: Secondary | ICD-10-CM | POA: Diagnosis not present

## 2016-04-10 DIAGNOSIS — M4854XA Collapsed vertebra, not elsewhere classified, thoracic region, initial encounter for fracture: Secondary | ICD-10-CM | POA: Diagnosis not present

## 2016-04-10 NOTE — Consult Note (Signed)
[facility] Psych ED Progress Note  04/10/2016 10:17 AM Pamela Kidd  MRN:  [id number] Subjective: ''I have been seeing strangers coming out of my room.'' Objective: Patient was seen, interviewed and chart reviewed and her case discussed with the treatment team. Patient with Dementia who remains psychotic, delusional, combative and aggressive. She has a fixed delusions that her husband is cheating on her with other women.  Principal Problem: Dementia with behavioral disturbance Diagnosis:   Patient Active Problem List   Diagnosis Date Noted  . Dementia with behavioral disturbance [F03.91]     Priority: High  . Hospital discharge follow-up [Z09] 08/07/2015  . Pressure ulcer [L89.90] 08/01/2015  . CAP (community acquired pneumonia) [J18.9] 07/30/2015  . Ataxia [R27.0] 07/30/2015  . Dysphagia, oropharyngeal [R13.12] 11/20/2014  . Gastritis [K29.70] 05/07/2014  . Difficulty hearing [H91.90] 04/16/2014  . Atypical chest pain [R07.89] 04/14/2014  . Near syncope [R55] 03/09/2014  . Chronic cholecystitis with calculus [K80.10] 08/01/2012  . Oral cancer ([place]) [C06.9]   . Hyperlipidemia [E78.5]   . Hypotension [I95.9] 11/04/2011  . CAD (coronary artery disease), native coronary artery [I25.10] 10/07/2011  . Old anterior myocardial infarction [I25.2] 08/26/2011  . Ventricular tachycardia ([place]) [I47.2] 08/26/2011  . Ischemic cardiomyopathy [I25.5] 08/26/2011  . Carcinoma of base of tongue ([place]) [C01] 04/15/2011   Total Time spent with patient: 25 minutes  Past Psychiatric History: as above  Past Medical History:  Past Medical History:  Diagnosis Date  . Anemia    "I've always been anemic; never had a transfusion" (06/08/2012)  . Asthmatic bronchitis    "as a child; haven't had any since ~ I was 29 yr old" (06/08/2012)  . Bruises easily    d/t taking Plavix and ASA  . CAD (coronary artery disease)    a. 08/26/2011 Acute Ant STEMI/Cath/PCI: LM nl, LAD 100p -> 2.75x23 Xience Xpedition,  LCX 20, minor irregs, RCA 20-68md, EF 30-35%, anterior/apical AK;  b. 05/2012 Cath: LM nl, LAD patent LAD stent, LCX 30ost, RCA dom, nl, EF 65%.  . Complication of anesthesia    slow to wake up  . Constipation    takes Miralax daily prn  . Dementia   . Diverticulosis   . Dry mouth   . Emphysema of lung ([place]   . Gastritis   . GERD (gastroesophageal reflux disease)    takes Protonix daily  . H/O hiatal hernia   . History of colon polyps   . Hyperlipidemia    takes Crestor nightly  . Hypotension    a. preventing use of ACEI  . Ischemic cardiomyopathy    a. 08/27/2011 Echo: EF 35-40%, mid-dist anteroseptal/apical akinesis, Gr 1 DD, mild-mod TR, PASP 343mg  . Oral cancer ([facility]   a. s/p radiation/chemotherapy  . Osteoporosis   . PONV (postoperative nausea and vomiting)   . STEMI (ST elevation myocardial infarction) ([facility]/2013   anterior/notes 09/07/2011 (06/08/2012)  . Subdural hematoma ([place]   [facility]. T12 compression fracture [facility]   [facility]. Vertigo    occasionally    Past Surgical History:  Procedure Laterality Date  . BUNIONECTOMY Bilateral 1970's  . CARDIAC CATHETERIZATION  06/09/2012   patent prox LAD stent, minor nonobstructive LAD disease, 30% ostial LCx, and otherwise widely patent cors; LVEF 65%  . CHOLECYSTECTOMY  08/18/2012  . CHOLECYSTECTOMY N/A 08/18/2012   Procedure: LAPAROSCOPIC CHOLECYSTECTOMY WITH INTRAOPERATIVE CHOLANGIOGRAM;  Surgeon: MaImogene BurnTsGeorgette DoverMD;  [place];  Service: General;  Laterality: N/A;  . COLONOSCOPY    . CORONARY ANGIOPLASTY WITH STENT PLACEMENT  08/2011   "1" (06/08/2012)  . EP IMPLANTABLE DEVICE N/A 07/31/2015   Procedure: Pacemaker Implant;  Surgeon: Evans Lance, MD;  Location: [place] CV LAB;  Service: Cardiovascular;  Laterality: N/A;  . LEFT HEART CATHETERIZATION WITH CORONARY ANGIOGRAM N/A 08/26/2011   Procedure: LEFT HEART CATHETERIZATION WITH CORONARY ANGIOGRAM;   Surgeon: Peter M Martinique, MD;  Location: [facility] CATH LAB;  Service: Cardiovascular;  Laterality: N/A;  . LEFT HEART CATHETERIZATION WITH CORONARY ANGIOGRAM N/A 06/09/2012   Procedure: LEFT HEART CATHETERIZATION WITH CORONARY ANGIOGRAM;  Surgeon: Sherren Mocha, MD;  Location: [facility] CATH LAB;  Service: Cardiovascular;  Laterality: N/A;  . PACEMAKER INSERTION     St Jude, dual chamber serial number, [id number], inserted 7/12 by Dr Lovena Le  . PEG PLACEMENT     in 2003 and then removed  . PERCUTANEOUS CORONARY STENT INTERVENTION (PCI-S) N/A 08/26/2011   Procedure: PERCUTANEOUS CORONARY STENT INTERVENTION (PCI-S);  Surgeon: Peter M Martinique, MD;  Location: [facility] CATH LAB;  Service: Cardiovascular;  Laterality: N/A;  . SHOULDER OPEN ROTATOR CUFF REPAIR Left 1990's  . TONSILLECTOMY  ~ 1951  . TUBAL LIGATION  1975  . VAGINAL HYSTERECTOMY  ~ 1989   partial   Family History:  Family History  Problem Relation Age of Onset  . Dementia Mother   . Heart disease Father   . Cancer Father     prostate  . Heart disease Paternal Grandfather   . Colon cancer Neg Hx    Family Psychiatric  History:  Social History:  History  Alcohol Use No     History  Drug Use No    Social History   Social History  . Marital status: Married    Spouse name: N/A  . Number of children: 4  . Years of education: N/A   Occupational History  . Retired    Social History Main Topics  . Smoking status: Never Smoker  . Smokeless tobacco: Never Used  . Alcohol use No  . Drug use: No  . Sexual activity: No   Other Topics Concern  . None   Social History Narrative  . None    Sleep: Fair  Appetite:  Fair  Current Medications: Current Facility-Administered Medications  Medication Dose Route Frequency Provider Last Rate Last Dose  . asenapine (SAPHRIS) sublingual tablet 5 mg  5 mg Sublingual Q12H PRN Corena Pilgrim, MD      . aspirin EC tablet 81 mg  81 mg Oral Daily Margette Fast, MD   81 mg at 04/09/16 0931  .  donepezil (ARICEPT) tablet 5 mg  5 mg Oral QHS Corena Pilgrim, MD   5 mg at 04/09/16 2120  . feeding supplement (ENSURE ENLIVE) (ENSURE ENLIVE) liquid 237 mL  237 mL Oral BID BM Davonna Belling, MD      . levothyroxine (SYNTHROID, LEVOTHROID) tablet 25 mcg  25 mcg Oral QAC breakfast Corena Pilgrim, MD   25 mcg at 04/10/16 0844  . pantoprazole (PROTONIX) EC tablet 40 mg  40 mg Oral QAC breakfast Margette Fast, MD   40 mg at 04/10/16 0844  . QUEtiapine (SEROQUEL) tablet 25 mg  25 mg Oral QHS Corena Pilgrim, MD   25 mg at 04/09/16 2120  . sertraline (ZOLOFT) tablet 100 mg  100 mg Oral Daily Margette Fast, MD   100 mg at 04/09/16 0930   Current Outpatient Prescriptions  Medication Sig Dispense Refill  . HYDROcodone-acetaminophen (NORCO/VICODIN) 5-325 MG tablet Take 1 tablet by mouth every 6 (six) hours as needed. 30 tablet 0  . folic acid (FOLVITE) 1 MG tablet Take 1 mg by mouth daily.    . sertraline (ZOLOFT) 100 MG tablet Take 100 mg by mouth daily.   5    Lab Results:  Results for orders placed or performed during the hospital encounter of 04/08/16 (from the past 48 hour(s))  Comprehensive metabolic panel     Status: Abnormal   Collection Time: 04/08/16  3:36 PM  Result Value Ref Range   Sodium 138 135 - 145 mmol/L   Potassium 3.6 3.5 - 5.1 mmol/L   Chloride 105 101 - 111 mmol/L   CO2 24 22 - 32 mmol/L   Glucose, Bld 98 65 - 99 mg/dL   BUN 14 6 - 20 mg/dL   Creatinine, Ser 1.15 (H) 0.44 - 1.00 mg/dL   Calcium 9.5 8.9 - 10.3 mg/dL   Total Protein 7.8 6.5 - 8.1 g/dL   Albumin 4.0 3.5 - 5.0 g/dL   AST 21 15 - 41 U/L   ALT 9 (L) 14 - 54 U/L   Alkaline Phosphatase 88 38 - 126 U/L   Total Bilirubin 0.6 0.3 - 1.2 mg/dL   GFR calc non Af Amer 47 (L) >60 mL/min   GFR calc Af Amer 55 (L) >60 mL/min    Comment: (NOTE) The eGFR has been calculated using the CKD EPI equation. This calculation has not been validated in all clinical situations. eGFR's persistently <60 mL/min signify  possible Chronic Kidney Disease.    Anion gap 9 5 - 15  Ethanol     Status: None   Collection Time: 04/08/16  3:36 PM  Result Value Ref Range   Alcohol, Ethyl (B) <5 <5 mg/dL    Comment:        LOWEST DETECTABLE LIMIT FOR SERUM ALCOHOL IS 5 mg/dL FOR MEDICAL PURPOSES ONLY   Salicylate level     Status: None   Collection Time: 04/08/16  3:36 PM  Result Value Ref Range   Salicylate Lvl <5.3 2.8 - 30.0 mg/dL  Acetaminophen level     Status: Abnormal   Collection Time: 04/08/16  3:36 PM  Result Value Ref Range   Acetaminophen (Tylenol), Serum <10 (L) 10 - 30 ug/mL    Comment:        THERAPEUTIC CONCENTRATIONS VARY SIGNIFICANTLY. A RANGE OF 10-30 ug/mL MAY BE AN EFFECTIVE CONCENTRATION FOR MANY PATIENTS. HOWEVER, SOME ARE BEST TREATED AT CONCENTRATIONS OUTSIDE THIS RANGE. ACETAMINOPHEN CONCENTRATIONS >150 ug/mL AT 4 HOURS AFTER INGESTION AND >50 ug/mL AT 12 HOURS AFTER INGESTION ARE OFTEN ASSOCIATED WITH TOXIC REACTIONS.   cbc     Status: None   Collection Time: 04/08/16  3:36 PM  Result Value Ref Range   WBC 6.3 4.0 - 10.5 K/uL   RBC 4.92 3.87 - 5.11 MIL/uL   Hemoglobin 13.8 12.0 - 15.0 g/dL   HCT 42.2 36.0 - 46.0 %   MCV 85.8 78.0 - 100.0 fL   MCH 28.0 26.0 - 34.0 pg   MCHC 32.7 30.0 - 36.0 g/dL   RDW 13.9 11.5 - 15.5 %   Platelets 290 150 - 400 K/uL  Rapid urine drug screen (hospital performed)     Status: Abnormal   Collection Time: 04/08/16  3:36 PM  Result Value Ref Range   Opiates NONE DETECTED NONE DETECTED   Cocaine NONE DETECTED NONE DETECTED  Benzodiazepines NONE DETECTED NONE DETECTED   Amphetamines NONE DETECTED NONE DETECTED   Tetrahydrocannabinol NONE DETECTED NONE DETECTED   Barbiturates POSITIVE (A) NONE DETECTED    Comment:        DRUG SCREEN FOR MEDICAL PURPOSES ONLY.  IF CONFIRMATION IS NEEDED FOR ANY PURPOSE, NOTIFY LAB WITHIN 5 DAYS.        LOWEST DETECTABLE LIMITS FOR URINE DRUG SCREEN Drug Class       Cutoff (ng/mL) Amphetamine       1000 Barbiturate      200 Benzodiazepine   157 Tricyclics       262 Opiates          300 Cocaine          300 THC              50   Urinalysis, Routine w reflex microscopic     Status: Abnormal   Collection Time: 04/08/16  3:38 PM  Result Value Ref Range   Color, Urine YELLOW YELLOW   APPearance CLEAR CLEAR   Specific Gravity, Urine 1.019 1.005 - 1.030   pH 5.0 5.0 - 8.0   Glucose, UA NEGATIVE NEGATIVE mg/dL   Hgb urine dipstick MODERATE (A) NEGATIVE   Bilirubin Urine NEGATIVE NEGATIVE   Ketones, ur 5 (A) NEGATIVE mg/dL   Protein, ur NEGATIVE NEGATIVE mg/dL   Nitrite NEGATIVE NEGATIVE   Leukocytes, UA MODERATE (A) NEGATIVE   RBC / HPF 0-5 0 - 5 RBC/hpf   WBC, UA 0-5 0 - 5 WBC/hpf   Bacteria, UA NONE SEEN NONE SEEN   Squamous Epithelial / LPF 0-5 (A) NONE SEEN   Mucous PRESENT     Blood Alcohol level:  Lab Results  Component Value Date   ETH <5 04/08/2016    Physical Findings: AIMS:  , ,  ,  ,    CIWA:    COWS:     Musculoskeletal: Strength & Muscle Tone: within normal limits Gait & Station: unsteady Patient leans: N/A  Psychiatric Specialty Exam: Physical Exam  Psychiatric: Her affect is blunt. Her speech is delayed. She is agitated, actively hallucinating and combative. Thought content is delusional. Cognition and memory are impaired. She expresses impulsivity.    Review of Systems  Constitutional: Negative.   HENT: Negative.   Respiratory: Negative.   Cardiovascular: Negative.   Skin: Negative.   Psychiatric/Behavioral: Positive for hallucinations. The patient is nervous/anxious.     Blood pressure (!) 93/53, pulse 82, temperature 98.6 F (37 C), temperature source Oral, resp. rate 18, height _0  (1.499 m), weight 37.6 kg (83 lb), SpO2 98 %.Body mass index is 16.76 kg/m.  General Appearance: Casual  Eye Contact:  Minimal  Speech:  Clear and Coherent  Volume:  Decreased  Mood:  Irritable  Affect:  Constricted  Thought Process:  Disorganized   Orientation:  Other:  only to person  Thought Content:  Illogical  Suicidal Thoughts:  No  Homicidal Thoughts:  No  Memory:  Immediate;   Poor Recent;   Poor Remote;   Fair  Judgement:  Poor  Insight:  Shallow  Psychomotor Activity:  Psychomotor Retardation  Concentration:  Concentration: Fair and Attention Span: Fair  Recall:  [employer] of Knowledge:  Fair  Language:  Good  Akathisia:  No  Handed:  Right  AIMS (if indicated):     Assets:  Communication Skills Social Support  ADL's:  Impaired  Cognition:  Impaired-mild to moderate  Sleep:   fair  Treatment Plan Summary: Daily contact with patient to assess and evaluate symptoms and progress in treatment and Medication management  Continue Zoloft 100 mg daily and Seroquel 25 mg qhs for mood stabilization Needs Geropsychiatric inpatient admission for stabilization.  Corena Pilgrim, MD 04/10/2016, 10:17 AM

## 2016-04-10 NOTE — ED Provider Notes (Signed)
1:05 PM Nursing called to report that patient has been accepted & [facility] for inpatient management of her psychiatric condition. Patient has been accepted by Dr. Sanjuana Letters.   Patient was examined by me and had no new complaints. Patient was eating lunch pleasantly. Patient had no chest pain, shortness breath, or other symptoms. Patient understands plan for transfer for inpatient management.   EMTALA documentation was filled out so the patient can be transferred.   Pamela Allegra Dameisha Tschida, MD 04/10/16 2020

## 2016-04-10 NOTE — Progress Notes (Signed)
Received call from [facility] Junie Panning) stating admitting MD will be reviewing pt's referral and they will call back if bed is offered.

## 2016-04-10 NOTE — BH Assessment (Signed)
[facility] Assessment Progress Note  Per Corena Pilgrim, MD, this pt continues to require psychiatric hospitalization at this time.  Pt presents under IVC initiated by Dr Darleene Cleaver.  At [place] calls from [facility] to report that pt has been accepted to their facility by Thornell Sartorius to the service of Dr Sanjuana Letters, Rm [id number].  Waylan Boga, DNP concurs with this decision, as does EDP Marda Stalker, MD.  Pt's nurse has been notified, and agrees to call report to [phone].  Pt is to be transported via [facility].  Jalene Mullet, [place] Triage Specialist [phone]

## 2016-04-10 NOTE — ED Notes (Signed)
Pamela Kidd was given report at [facility], and asked to be called upon pt discharge from [facility] at [phone]

## 2016-04-10 NOTE — ED Notes (Signed)
Sherriff's office contacted for transport and report give, Respondent stated that pt would be picked up between 3p-7p.

## 2016-04-11 DIAGNOSIS — G3183 Dementia with Lewy bodies: Secondary | ICD-10-CM | POA: Diagnosis not present

## 2016-04-11 DIAGNOSIS — F0281 Dementia in other diseases classified elsewhere with behavioral disturbance: Secondary | ICD-10-CM | POA: Diagnosis not present

## 2016-04-12 DIAGNOSIS — F0281 Dementia in other diseases classified elsewhere with behavioral disturbance: Secondary | ICD-10-CM | POA: Diagnosis not present

## 2016-04-12 DIAGNOSIS — G3183 Dementia with Lewy bodies: Secondary | ICD-10-CM | POA: Diagnosis not present

## 2016-04-13 DIAGNOSIS — G3183 Dementia with Lewy bodies: Secondary | ICD-10-CM | POA: Diagnosis not present

## 2016-04-13 DIAGNOSIS — F0281 Dementia in other diseases classified elsewhere with behavioral disturbance: Secondary | ICD-10-CM | POA: Diagnosis not present

## 2016-04-13 DIAGNOSIS — R4182 Altered mental status, unspecified: Secondary | ICD-10-CM | POA: Diagnosis not present

## 2016-04-14 DIAGNOSIS — F0281 Dementia in other diseases classified elsewhere with behavioral disturbance: Secondary | ICD-10-CM | POA: Diagnosis not present

## 2016-04-14 DIAGNOSIS — E7800 Pure hypercholesterolemia, unspecified: Secondary | ICD-10-CM | POA: Diagnosis not present

## 2016-04-14 DIAGNOSIS — G3183 Dementia with Lewy bodies: Secondary | ICD-10-CM | POA: Diagnosis not present

## 2016-04-15 DIAGNOSIS — G3183 Dementia with Lewy bodies: Secondary | ICD-10-CM | POA: Diagnosis not present

## 2016-04-15 DIAGNOSIS — F0281 Dementia in other diseases classified elsewhere with behavioral disturbance: Secondary | ICD-10-CM | POA: Diagnosis not present

## 2016-04-15 DIAGNOSIS — E7800 Pure hypercholesterolemia, unspecified: Secondary | ICD-10-CM | POA: Diagnosis not present

## 2016-04-16 DIAGNOSIS — E7800 Pure hypercholesterolemia, unspecified: Secondary | ICD-10-CM | POA: Diagnosis not present

## 2016-04-16 DIAGNOSIS — G3183 Dementia with Lewy bodies: Secondary | ICD-10-CM | POA: Diagnosis not present

## 2016-04-16 DIAGNOSIS — F0281 Dementia in other diseases classified elsewhere with behavioral disturbance: Secondary | ICD-10-CM | POA: Diagnosis not present

## 2016-04-17 DIAGNOSIS — F0281 Dementia in other diseases classified elsewhere with behavioral disturbance: Secondary | ICD-10-CM | POA: Diagnosis not present

## 2016-04-17 DIAGNOSIS — G3183 Dementia with Lewy bodies: Secondary | ICD-10-CM | POA: Diagnosis not present

## 2016-04-17 DIAGNOSIS — E7800 Pure hypercholesterolemia, unspecified: Secondary | ICD-10-CM | POA: Diagnosis not present

## 2016-04-18 DIAGNOSIS — G3183 Dementia with Lewy bodies: Secondary | ICD-10-CM | POA: Diagnosis not present

## 2016-04-18 DIAGNOSIS — F0281 Dementia in other diseases classified elsewhere with behavioral disturbance: Secondary | ICD-10-CM | POA: Diagnosis not present

## 2016-04-18 DIAGNOSIS — E7800 Pure hypercholesterolemia, unspecified: Secondary | ICD-10-CM | POA: Diagnosis not present

## 2016-04-19 DIAGNOSIS — F0281 Dementia in other diseases classified elsewhere with behavioral disturbance: Secondary | ICD-10-CM | POA: Diagnosis not present

## 2016-04-19 DIAGNOSIS — G3183 Dementia with Lewy bodies: Secondary | ICD-10-CM | POA: Diagnosis not present

## 2016-04-19 DIAGNOSIS — E7800 Pure hypercholesterolemia, unspecified: Secondary | ICD-10-CM | POA: Diagnosis not present

## 2016-04-20 DIAGNOSIS — E7800 Pure hypercholesterolemia, unspecified: Secondary | ICD-10-CM | POA: Diagnosis not present

## 2016-04-20 DIAGNOSIS — G3183 Dementia with Lewy bodies: Secondary | ICD-10-CM | POA: Diagnosis not present

## 2016-04-20 DIAGNOSIS — F0281 Dementia in other diseases classified elsewhere with behavioral disturbance: Secondary | ICD-10-CM | POA: Diagnosis not present

## 2016-04-21 DIAGNOSIS — F0281 Dementia in other diseases classified elsewhere with behavioral disturbance: Secondary | ICD-10-CM | POA: Diagnosis not present

## 2016-04-21 DIAGNOSIS — G3183 Dementia with Lewy bodies: Secondary | ICD-10-CM | POA: Diagnosis not present

## 2016-04-22 DIAGNOSIS — G3183 Dementia with Lewy bodies: Secondary | ICD-10-CM | POA: Diagnosis not present

## 2016-04-22 DIAGNOSIS — F0281 Dementia in other diseases classified elsewhere with behavioral disturbance: Secondary | ICD-10-CM | POA: Diagnosis not present

## 2016-04-23 DIAGNOSIS — G3183 Dementia with Lewy bodies: Secondary | ICD-10-CM | POA: Diagnosis not present

## 2016-04-23 DIAGNOSIS — F0281 Dementia in other diseases classified elsewhere with behavioral disturbance: Secondary | ICD-10-CM | POA: Diagnosis not present

## 2016-04-24 DIAGNOSIS — G3183 Dementia with Lewy bodies: Secondary | ICD-10-CM | POA: Diagnosis not present

## 2016-04-24 DIAGNOSIS — F0281 Dementia in other diseases classified elsewhere with behavioral disturbance: Secondary | ICD-10-CM | POA: Diagnosis not present

## 2016-04-25 DIAGNOSIS — G3183 Dementia with Lewy bodies: Secondary | ICD-10-CM | POA: Diagnosis not present

## 2016-04-25 DIAGNOSIS — F0281 Dementia in other diseases classified elsewhere with behavioral disturbance: Secondary | ICD-10-CM | POA: Diagnosis not present

## 2016-04-26 DIAGNOSIS — F0281 Dementia in other diseases classified elsewhere with behavioral disturbance: Secondary | ICD-10-CM | POA: Diagnosis not present

## 2016-04-26 DIAGNOSIS — G3183 Dementia with Lewy bodies: Secondary | ICD-10-CM | POA: Diagnosis not present

## 2016-04-27 DIAGNOSIS — F0281 Dementia in other diseases classified elsewhere with behavioral disturbance: Secondary | ICD-10-CM | POA: Diagnosis not present

## 2016-04-27 DIAGNOSIS — G3183 Dementia with Lewy bodies: Secondary | ICD-10-CM | POA: Diagnosis not present

## 2016-04-28 DIAGNOSIS — E7800 Pure hypercholesterolemia, unspecified: Secondary | ICD-10-CM | POA: Diagnosis not present

## 2016-04-28 DIAGNOSIS — F0281 Dementia in other diseases classified elsewhere with behavioral disturbance: Secondary | ICD-10-CM | POA: Diagnosis not present

## 2016-04-28 DIAGNOSIS — G3183 Dementia with Lewy bodies: Secondary | ICD-10-CM | POA: Diagnosis not present

## 2016-04-29 DIAGNOSIS — F0281 Dementia in other diseases classified elsewhere with behavioral disturbance: Secondary | ICD-10-CM | POA: Diagnosis not present

## 2016-04-29 DIAGNOSIS — G3183 Dementia with Lewy bodies: Secondary | ICD-10-CM | POA: Diagnosis not present

## 2016-04-29 DIAGNOSIS — E7800 Pure hypercholesterolemia, unspecified: Secondary | ICD-10-CM | POA: Diagnosis not present

## 2016-04-30 DIAGNOSIS — F0281 Dementia in other diseases classified elsewhere with behavioral disturbance: Secondary | ICD-10-CM | POA: Diagnosis not present

## 2016-04-30 DIAGNOSIS — E7800 Pure hypercholesterolemia, unspecified: Secondary | ICD-10-CM | POA: Diagnosis not present

## 2016-04-30 DIAGNOSIS — G3183 Dementia with Lewy bodies: Secondary | ICD-10-CM | POA: Diagnosis not present

## 2016-05-01 DIAGNOSIS — G3183 Dementia with Lewy bodies: Secondary | ICD-10-CM | POA: Diagnosis not present

## 2016-05-01 DIAGNOSIS — F0281 Dementia in other diseases classified elsewhere with behavioral disturbance: Secondary | ICD-10-CM | POA: Diagnosis not present

## 2016-05-01 DIAGNOSIS — E7800 Pure hypercholesterolemia, unspecified: Secondary | ICD-10-CM | POA: Diagnosis not present

## 2016-05-02 DIAGNOSIS — G3183 Dementia with Lewy bodies: Secondary | ICD-10-CM | POA: Diagnosis not present

## 2016-05-02 DIAGNOSIS — F0281 Dementia in other diseases classified elsewhere with behavioral disturbance: Secondary | ICD-10-CM | POA: Diagnosis not present

## 2016-05-02 DIAGNOSIS — E7800 Pure hypercholesterolemia, unspecified: Secondary | ICD-10-CM | POA: Diagnosis not present

## 2016-05-03 DIAGNOSIS — F0281 Dementia in other diseases classified elsewhere with behavioral disturbance: Secondary | ICD-10-CM | POA: Diagnosis not present

## 2016-05-03 DIAGNOSIS — G3183 Dementia with Lewy bodies: Secondary | ICD-10-CM | POA: Diagnosis not present

## 2016-05-03 DIAGNOSIS — E7800 Pure hypercholesterolemia, unspecified: Secondary | ICD-10-CM | POA: Diagnosis not present

## 2016-05-04 DIAGNOSIS — G3183 Dementia with Lewy bodies: Secondary | ICD-10-CM | POA: Diagnosis not present

## 2016-05-04 DIAGNOSIS — E7800 Pure hypercholesterolemia, unspecified: Secondary | ICD-10-CM | POA: Diagnosis not present

## 2016-05-04 DIAGNOSIS — F0281 Dementia in other diseases classified elsewhere with behavioral disturbance: Secondary | ICD-10-CM | POA: Diagnosis not present

## 2016-05-05 ENCOUNTER — Telehealth: Payer: Self-pay | Admitting: Cardiology

## 2016-05-05 ENCOUNTER — Encounter: Payer: PPO | Admitting: *Deleted

## 2016-05-05 DIAGNOSIS — G3183 Dementia with Lewy bodies: Secondary | ICD-10-CM | POA: Diagnosis not present

## 2016-05-05 DIAGNOSIS — F0281 Dementia in other diseases classified elsewhere with behavioral disturbance: Secondary | ICD-10-CM | POA: Diagnosis not present

## 2016-05-05 NOTE — Telephone Encounter (Signed)
Noted  

## 2016-05-05 NOTE — Telephone Encounter (Signed)
New Message     Pt has been admitted to a in patient psych center , and by the end of week she will be most like sent to a nursing home, Husband will call back to make arrangement when he know more.

## 2016-05-05 NOTE — Telephone Encounter (Signed)
LMOVM reminding pt to send remote transmission.   

## 2016-05-06 DIAGNOSIS — F0281 Dementia in other diseases classified elsewhere with behavioral disturbance: Secondary | ICD-10-CM | POA: Diagnosis not present

## 2016-05-06 DIAGNOSIS — G3183 Dementia with Lewy bodies: Secondary | ICD-10-CM | POA: Diagnosis not present

## 2016-05-07 ENCOUNTER — Telehealth: Payer: Self-pay | Admitting: Family Medicine

## 2016-05-07 DIAGNOSIS — F0281 Dementia in other diseases classified elsewhere with behavioral disturbance: Secondary | ICD-10-CM

## 2016-05-07 DIAGNOSIS — G3183 Dementia with Lewy bodies: Principal | ICD-10-CM

## 2016-05-07 NOTE — Telephone Encounter (Signed)
agreed

## 2016-05-07 NOTE — Telephone Encounter (Signed)
Pt being discharged from Hospital today.  Want to get Hospice in for her as she is declining.  Referral placed and [facility] called.

## 2016-05-08 ENCOUNTER — Encounter: Payer: Self-pay | Admitting: Cardiology

## 2016-05-11 ENCOUNTER — Other Ambulatory Visit: Payer: Self-pay

## 2016-05-11 NOTE — Progress Notes (Signed)
This encounter was created in error - please disregard.

## 2016-05-11 NOTE — Patient Outreach (Signed)
   RNCM was successful in making contact with patient's husband. Mr. Autumn Patty stated patient's care management needs are being handled by Hospice. Mr. Autumn Patty advises Hospice started April 20.  Plan: Discharge from caseload

## 2016-05-11 NOTE — Addendum Note (Signed)
Addended by: Virgel Manifold on: 05/11/2016 03:53 PM   Modules accepted: Level of Service, SmartSet

## 2016-05-13 ENCOUNTER — Ambulatory Visit (INDEPENDENT_AMBULATORY_CARE_PROVIDER_SITE_OTHER): Payer: PPO | Admitting: *Deleted

## 2016-05-13 DIAGNOSIS — I495 Sick sinus syndrome: Secondary | ICD-10-CM

## 2016-05-15 ENCOUNTER — Telehealth: Payer: Self-pay | Admitting: *Deleted

## 2016-05-15 ENCOUNTER — Encounter: Payer: Self-pay | Admitting: Cardiology

## 2016-05-15 LAB — CUP PACEART REMOTE DEVICE CHECK
Battery Remaining Percentage: 95.5 %
Battery Voltage: 3.02 V
Brady Statistic AS VS Percent: 95 %
Brady Statistic RA Percent Paced: 4.7 %
Brady Statistic RV Percent Paced: 1 %
Date Time Interrogation Session: 20180425060014
Implantable Lead Implant Date: 20170712
Implantable Lead Location: 753859
Implantable Pulse Generator Implant Date: 20170712
Lead Channel Impedance Value: 410 Ohm
Lead Channel Pacing Threshold Amplitude: 0.5 V
Lead Channel Pacing Threshold Pulse Width: 0.5 ms
Lead Channel Pacing Threshold Pulse Width: 0.5 ms
Lead Channel Sensing Intrinsic Amplitude: 5 mV
Lead Channel Setting Pacing Amplitude: 2.5 V
Lead Channel Setting Sensing Sensitivity: 2 mV
MDC IDC LEAD IMPLANT DT: 20170712
MDC IDC LEAD LOCATION: 753860
MDC IDC MSMT BATTERY REMAINING LONGEVITY: 118 mo
MDC IDC MSMT LEADCHNL RV IMPEDANCE VALUE: 510 Ohm
MDC IDC MSMT LEADCHNL RV PACING THRESHOLD AMPLITUDE: 0.75 V
MDC IDC MSMT LEADCHNL RV SENSING INTR AMPL: 12 mV
MDC IDC PG SERIAL: 7921680
MDC IDC SET LEADCHNL RA PACING AMPLITUDE: 2 V
MDC IDC SET LEADCHNL RV PACING PULSEWIDTH: 0.5 ms
MDC IDC STAT BRADY AP VP PERCENT: 1 %
MDC IDC STAT BRADY AP VS PERCENT: 4.9 %
MDC IDC STAT BRADY AS VP PERCENT: 1 %
Pulse Gen Model: 2272

## 2016-05-15 NOTE — Telephone Encounter (Signed)
While confirming appointment for Monday 05/18/16 with Dr. Littie Deeds was stated Hospice would like to be copied on Pamela Kidd's visit.

## 2016-05-15 NOTE — Progress Notes (Signed)
Remote pacemaker transmission.   

## 2016-05-16 NOTE — Progress Notes (Signed)
Pamela Kidd Date of Birth: 1945-04-22 Medical Record #[id number]  History of Present Illness:  Pamela Kidd is seen for follow up CAD.  She has known CAD with anterior MI in August of 2013 treated with DES of the proximal LAD. EF  recovered.  Repeat cath 5/14 showed nonobstructive disease. Myoview March 2016 showed fixed septal defect without ischemia. EF 78%. Other issues include HLD, gastritis,  and GERD. She also had an event monitor at that time that was unremarkable. She was admitted in July 2017 with ataxia, altered mental status, dysphagia, ? Aspiration PNA . She had some afib with sinus pauses up to 8 seconds. She underwent successful PPM placement by Dr. Lovena Le. She does have moderate dementia. Declined feeding tube. Not felt to be suitable for long term anticoagulation. Follow up pacemaker checks show some atrial tachycardia but no afib. She does have chronic orthostasis and hypotension.  She was admitted in March to [facility] with Neurocognitive disorder with behavioral disturbance. In hospital for 3 weeks. Had hallucinations- improved on Seroquel. All other meds stopped except ASA. Since then has been at home with her husband. Unable to eat much of anything. Has lost >20 lbs. Now on home Hospice as of one week ago. Denies any chest pain. Talks about her dogs. Wearing extensive bracelet and necklace collection- husband states she enjoys wearing.    Allergies as of 05/18/2016      Reactions   Nubain [nalbuphine Hcl] Other (See Comments)   "headache & disoriented"    Ampicillin Itching, Dermatitis   Codeine Itching, Nausea And Vomiting   Other reaction(s): GI Upset (intolerance)   Cisapride Other (See Comments)      Medication List       Accurate as of 05/18/16  6:12 PM. Always use your most recent med list.          aspirin 81 MG tablet Take 81 mg by mouth daily.   QUEtiapine 25 MG tablet Commonly known as:  SEROQUEL Take 1 tablet (25 mg total) by mouth at  bedtime.        Allergies  Allergen Reactions  . Nubain [Nalbuphine Hcl] Other (See Comments)    "headache & disoriented"    . Ampicillin Itching and Dermatitis  . Codeine Itching and Nausea And Vomiting    Other reaction(s): GI Upset (intolerance)  . Cisapride Other (See Comments)    Past Medical History:  Diagnosis Date  . Anemia    "I've always been anemic; never had a transfusion" (06/08/2012)  . Asthmatic bronchitis    "as a child; haven't had any since ~ I was 52 yr old" (06/08/2012)  . Bruises easily    d/t taking Plavix and ASA  . CAD (coronary artery disease)    a. 08/26/2011 Acute Ant STEMI/Cath/PCI: LM nl, LAD 100p -> 2.75x23 Xience Xpedition, LCX 20, minor irregs, RCA 20-20mid, EF 30-35%, anterior/apical AK;  b. 05/2012 Cath: LM nl, LAD patent LAD stent, LCX 30ost, RCA dom, nl, EF 65%.  . Complication of anesthesia    slow to wake up  . Constipation    takes Miralax daily prn  . Dementia   . Diverticulosis   . Dry mouth   . Emphysema of lung ([place])   . Gastritis   . GERD (gastroesophageal reflux disease)    takes Protonix daily  . H/O hiatal hernia   . History of colon polyps   . Hyperlipidemia    takes Crestor nightly  . Hypotension  a. preventing use of ACEI  . Ischemic cardiomyopathy    a. 08/27/2011 Echo: EF 35-40%, mid-dist anteroseptal/apical akinesis, Gr 1 DD, mild-mod TR, PASP 75mmHg  . Oral cancer ([place]) 2003   a. s/p radiation/chemotherapy  . Osteoporosis   . PONV (postoperative nausea and vomiting)   . STEMI (ST elevation myocardial infarction) ([place]) 08/2011   anterior/notes 09/07/2011 (06/08/2012)  . Subdural hematoma ([place])    small after fall at [facility]  . T12 compression fracture ([place])    after fall at [facility]  . Vertigo    occasionally    Past Surgical History:  Procedure Laterality Date  . BUNIONECTOMY Bilateral 1970's  . CARDIAC CATHETERIZATION  06/09/2012   patent prox LAD stent, minor nonobstructive LAD disease, 30% ostial  LCx, and otherwise widely patent cors; LVEF 65%  . CHOLECYSTECTOMY  08/18/2012  . CHOLECYSTECTOMY N/A 08/18/2012   Procedure: LAPAROSCOPIC CHOLECYSTECTOMY WITH INTRAOPERATIVE CHOLANGIOGRAM;  Surgeon: Imogene Burn. Georgette Dover, MD;  Location: [place];  Service: General;  Laterality: N/A;  . COLONOSCOPY    . CORONARY ANGIOPLASTY WITH STENT PLACEMENT  08/2011   "1" (06/08/2012)  . EP IMPLANTABLE DEVICE N/A 07/31/2015   Procedure: Pacemaker Implant;  Surgeon: Evans Lance, MD;  Location: [place] CV LAB;  Service: Cardiovascular;  Laterality: N/A;  . LEFT HEART CATHETERIZATION WITH CORONARY ANGIOGRAM N/A 08/26/2011   Procedure: LEFT HEART CATHETERIZATION WITH CORONARY ANGIOGRAM;  Surgeon: Peter M Martinique, MD;  Location: [facility] CATH LAB;  Service: Cardiovascular;  Laterality: N/A;  . LEFT HEART CATHETERIZATION WITH CORONARY ANGIOGRAM N/A 06/09/2012   Procedure: LEFT HEART CATHETERIZATION WITH CORONARY ANGIOGRAM;  Surgeon: Sherren Mocha, MD;  Location: [facility] CATH LAB;  Service: Cardiovascular;  Laterality: N/A;  . PACEMAKER INSERTION     St Jude, dual chamber serial number, [id number], inserted 7/12 by Dr Lovena Le  . PEG PLACEMENT     in 2003 and then removed  . PERCUTANEOUS CORONARY STENT INTERVENTION (PCI-S) N/A 08/26/2011   Procedure: PERCUTANEOUS CORONARY STENT INTERVENTION (PCI-S);  Surgeon: Peter M Martinique, MD;  Location: [facility] CATH LAB;  Service: Cardiovascular;  Laterality: N/A;  . SHOULDER OPEN ROTATOR CUFF REPAIR Left 1990's  . TONSILLECTOMY  ~ 1951  . TUBAL LIGATION  1975  . VAGINAL HYSTERECTOMY  ~ 1989   partial    History  Smoking Status  . Never Smoker  Smokeless Tobacco  . Never Used    History  Alcohol Use No    Family History  Problem Relation Age of Onset  . Dementia Mother   . Heart disease Father   . Cancer Father     prostate  . Heart disease Paternal Grandfather   . Colon cancer Neg Hx     Review of Systems: The review of systems is per the HPI.  All other systems were reviewed and  are negative.  Physical Exam: BP (!) 80/60   Pulse (!) 58   Ht 4\' 11"  (1.499 m)   Wt 66 lb (29.9 kg)   BMI 13.33 kg/m  BP 110/70 supine, 110/80 sitting, 106/74 standing.  Patient is very pleasant and very thin/emaciated,   in no acute distress. Skin is warm and dry. Color is normal.  HEENT is unremarkable. Normocephalic/atraumatic. PERRL. Sclera are nonicteric. Neck is supple. No masses. No JVD. Lungs are clear. Cardiac exam shows a regular rate and rhythm. Abdomen is soft. Extremities are without edema. She is seen in a wheelchair.  No gross neurologic deficits noted. Speech is difficult to understand.  LABORATORY DATA:  Lab Results  Component Value Date   WBC 6.3 04/08/2016   HGB 13.8 04/08/2016   HCT 42.2 04/08/2016   PLT 290 04/08/2016   GLUCOSE 98 04/08/2016   CHOL 110 07/31/2015   TRIG 102 07/31/2015   HDL 52 07/31/2015   LDLCALC 38 07/31/2015   ALT 9 (L) 04/08/2016   AST 21 04/08/2016   NA 138 04/08/2016   K 3.6 04/08/2016   CL 105 04/08/2016   CREATININE 1.15 (H) 04/08/2016   BUN 14 04/08/2016   CO2 24 04/08/2016   TSH 3.01 09/27/2015   INR 1.22 07/30/2015   HGBA1C 5.7 (H) 07/31/2015    Assessment / Plan: 1. CAD - s/p DES of LAD August 2013. Cath in May 2014 showed nonobstructive disease. Myoview in March 2016 was low risk.  No significant angina. Continue ASA 81 mg.   2. Past anterior MI in August of 2013 treated with DES of the proximal LAD. EF has recovered and is normal.   3. HLD  No longer on medication therapy due to profound weight loss.   4. Sinus node dysfunction s/p PPM. Followed in device clinic.   5. Chronic weight loss progressive. Related to neurocognitive impairment and dysphagia. Declined feeding tube. Prognosis looks very poor. Now on Hospice.   6. Dementia with some anxiety.   7. Chronic hypotension with orthostasis. On no antihypertensive therapy. High risk for fall.

## 2016-05-18 ENCOUNTER — Ambulatory Visit (INDEPENDENT_AMBULATORY_CARE_PROVIDER_SITE_OTHER): Payer: PPO | Admitting: Cardiology

## 2016-05-18 ENCOUNTER — Encounter: Payer: Self-pay | Admitting: Cardiology

## 2016-05-18 ENCOUNTER — Encounter: Payer: Self-pay | Admitting: Family Medicine

## 2016-05-18 VITALS — BP 80/60 | HR 58 | Ht 59.0 in | Wt <= 1120 oz

## 2016-05-18 DIAGNOSIS — Z950 Presence of cardiac pacemaker: Secondary | ICD-10-CM

## 2016-05-18 DIAGNOSIS — I252 Old myocardial infarction: Secondary | ICD-10-CM | POA: Diagnosis not present

## 2016-05-18 DIAGNOSIS — I495 Sick sinus syndrome: Secondary | ICD-10-CM | POA: Diagnosis not present

## 2016-05-18 DIAGNOSIS — I2510 Atherosclerotic heart disease of native coronary artery without angina pectoris: Secondary | ICD-10-CM | POA: Diagnosis not present

## 2016-05-18 MED ORDER — QUETIAPINE FUMARATE 25 MG PO TABS: 25.0000 mg | ORAL_TABLET | Freq: Every day | ORAL | Status: AC

## 2016-05-18 NOTE — Patient Instructions (Signed)
Continue your current therapy  I will see you in 6 months.   

## 2016-05-19 ENCOUNTER — Telehealth: Payer: Self-pay | Admitting: Family Medicine

## 2016-05-19 ENCOUNTER — Telehealth: Payer: Self-pay

## 2016-05-19 NOTE — Telephone Encounter (Signed)
Dr.Jordan's 05/18/16 office note faxed to Hospice at fax # [phone].
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
## 2016-05-19 NOTE — Telephone Encounter (Signed)
I would recommend haldol 0.5 mg po q 12 hrs prn agitation.

## 2016-05-20 MED ORDER — HALOPERIDOL 0.5 MG PO TABS: 0.5000 mg | ORAL_TABLET | Freq: Two times a day (BID) | ORAL | 2 refills | Status: DC

## 2016-05-20 NOTE — Telephone Encounter (Signed)
Husband and Renee aware of new med and med sent to [facility]

## 2016-05-20 NOTE — Telephone Encounter (Signed)
[facility] for husband

## 2016-06-02 ENCOUNTER — Telehealth: Payer: Self-pay | Admitting: Family Medicine

## 2016-06-02 MED ORDER — HALOPERIDOL 1 MG PO TABS: 1.0000 mg | ORAL_TABLET | Freq: Two times a day (BID) | ORAL | 1 refills | Status: AC

## 2016-06-02 NOTE — Telephone Encounter (Signed)
Renee aware of provider med changes and called and spoke to husband and he is aware of med change and med sent to [facility]

## 2016-06-02 NOTE — Telephone Encounter (Signed)
Can increase haldol to 1 mg po bid prn agitation.

## 2016-06-02 NOTE — Telephone Encounter (Signed)
Renee from hospice called and would like to know if we could increase her Haldol. She is still having angry outburst, staying up all hours of the night, taking clothes off all the time, wakes her husband up all hours as well. She is taking .5mg  of Haldol and 25mg  Seroquel QHS.

## 2016-06-17 ENCOUNTER — Telehealth: Payer: Self-pay | Admitting: Family Medicine

## 2016-06-17 DIAGNOSIS — S22000A Wedge compression fracture of unspecified thoracic vertebra, initial encounter for closed fracture: Secondary | ICD-10-CM

## 2016-06-17 NOTE — Telephone Encounter (Signed)
Pt needs refill on hydrocodone.  °

## 2016-06-17 NOTE — Telephone Encounter (Signed)
Ok to refill 

## 2016-06-18 MED ORDER — HYDROCODONE-ACETAMINOPHEN 5-325 MG PO TABS: 1.0000 | ORAL_TABLET | Freq: Four times a day (QID) | ORAL | 0 refills | Status: AC | PRN

## 2016-06-18 NOTE — Telephone Encounter (Signed)
RX printed, left up front and patient aware to pick up after 2 pm  

## 2016-06-18 NOTE — Telephone Encounter (Signed)
ok 

## 2016-07-09 ENCOUNTER — Other Ambulatory Visit: Payer: Self-pay | Admitting: Gastroenterology

## 2016-07-19 DEATH — deceased
